# Patient Record
Sex: Male | Born: 1965 | Race: Black or African American | Hispanic: No | Smoking: Never smoker
Health system: Southern US, Community
[De-identification: ages and names within clinical notes are randomized; demographics above are authoritative.]

## PROBLEM LIST (undated history)

## (undated) DIAGNOSIS — B20 Human immunodeficiency virus [HIV] disease: Secondary | ICD-10-CM

## (undated) DIAGNOSIS — A609 Anogenital herpesviral infection, unspecified: Secondary | ICD-10-CM

## (undated) DIAGNOSIS — Z21 Asymptomatic human immunodeficiency virus [HIV] infection status: Secondary | ICD-10-CM

## (undated) DIAGNOSIS — I10 Essential (primary) hypertension: Secondary | ICD-10-CM

## (undated) HISTORY — PX: CIRCUMCISION: SUR203

## (undated) HISTORY — DX: Anogenital herpesviral infection, unspecified: A60.9

## (undated) HISTORY — PX: HERNIA REPAIR: SHX51

---

## 2010-09-16 ENCOUNTER — Emergency Department (HOSPITAL_COMMUNITY): Admission: EM | Admit: 2010-09-16 | Discharge: 2010-09-16 | Payer: Self-pay | Admitting: Emergency Medicine

## 2011-12-30 ENCOUNTER — Other Ambulatory Visit: Payer: Self-pay | Admitting: Internal Medicine

## 2011-12-30 ENCOUNTER — Ambulatory Visit (INDEPENDENT_AMBULATORY_CARE_PROVIDER_SITE_OTHER): Payer: Self-pay

## 2011-12-30 DIAGNOSIS — I10 Essential (primary) hypertension: Secondary | ICD-10-CM

## 2011-12-30 DIAGNOSIS — Z23 Encounter for immunization: Secondary | ICD-10-CM

## 2011-12-30 DIAGNOSIS — B20 Human immunodeficiency virus [HIV] disease: Secondary | ICD-10-CM

## 2011-12-30 LAB — CBC WITH DIFFERENTIAL/PLATELET
Eosinophils Absolute: 0.4 10*3/uL (ref 0.0–0.7)
Hemoglobin: 14.9 g/dL (ref 13.0–17.0)
Lymphs Abs: 0.7 10*3/uL (ref 0.7–4.0)
MCH: 32 pg (ref 26.0–34.0)
Monocytes Relative: 15 % — ABNORMAL HIGH (ref 3–12)
Neutro Abs: 1.2 10*3/uL — ABNORMAL LOW (ref 1.7–7.7)
Neutrophils Relative %: 45 % (ref 43–77)
Platelets: 170 10*3/uL (ref 150–400)
RBC: 4.66 MIL/uL (ref 4.22–5.81)
WBC: 2.7 10*3/uL — ABNORMAL LOW (ref 4.0–10.5)

## 2011-12-30 LAB — COMPLETE METABOLIC PANEL WITH GFR
ALT: 19 U/L (ref 0–53)
Albumin: 4.2 g/dL (ref 3.5–5.2)
Alkaline Phosphatase: 40 U/L (ref 39–117)
CO2: 26 mEq/L (ref 19–32)
GFR, Est African American: >89 mL/min
Glucose, Bld: 84 mg/dL (ref 70–99)
Potassium: 3.4 mEq/L — ABNORMAL LOW (ref 3.5–5.3)
Sodium: 139 mEq/L (ref 135–145)
Total Bilirubin: 0.7 mg/dL (ref 0.3–1.2)
Total Protein: 8 g/dL (ref 6.0–8.3)

## 2011-12-30 LAB — URINALYSIS
Hgb urine dipstick: NEGATIVE
Leukocytes, UA: NEGATIVE
Nitrite: NEGATIVE
Protein, ur: NEGATIVE mg/dL

## 2011-12-30 LAB — HEPATITIS B SURFACE ANTIBODY,QUALITATIVE: Hep B S Ab: POSITIVE — AB

## 2011-12-30 LAB — LIPID PANEL: Cholesterol: 139 mg/dL (ref 0–200)

## 2011-12-31 DIAGNOSIS — I10 Essential (primary) hypertension: Secondary | ICD-10-CM | POA: Insufficient documentation

## 2011-12-31 LAB — GC/CHLAMYDIA PROBE AMP, URINE: Chlamydia, Swab/Urine, PCR: NEGATIVE

## 2011-12-31 LAB — HEPATITIS A ANTIBODY, TOTAL: Hep A Total Ab: NEGATIVE

## 2011-12-31 LAB — HEPATITIS B CORE ANTIBODY, TOTAL: Hep B Core Total Ab: NEGATIVE

## 2012-01-01 LAB — TB SKIN TEST
Induration: 0
TB Skin Test: NEGATIVE mm

## 2012-01-01 LAB — HIV-1 RNA ULTRAQUANT REFLEX TO GENTYP+
HIV 1 RNA Quant: 48403 copies/mL — ABNORMAL HIGH (ref <20)
HIV-1 RNA Quant, Log: 4.68 {Log} — ABNORMAL HIGH (ref <1.30)

## 2012-01-13 ENCOUNTER — Ambulatory Visit (INDEPENDENT_AMBULATORY_CARE_PROVIDER_SITE_OTHER): Payer: Self-pay | Admitting: Internal Medicine

## 2012-01-13 ENCOUNTER — Ambulatory Visit: Payer: Self-pay

## 2012-01-13 ENCOUNTER — Encounter: Payer: Self-pay | Admitting: Internal Medicine

## 2012-01-13 VITALS — BP 166/109 | HR 75 | Temp 98.2°F | Wt 165.0 lb

## 2012-01-13 DIAGNOSIS — B20 Human immunodeficiency virus [HIV] disease: Secondary | ICD-10-CM | POA: Insufficient documentation

## 2012-01-13 DIAGNOSIS — Z298 Encounter for other specified prophylactic measures: Secondary | ICD-10-CM

## 2012-01-13 DIAGNOSIS — Z21 Asymptomatic human immunodeficiency virus [HIV] infection status: Secondary | ICD-10-CM

## 2012-01-13 DIAGNOSIS — Z79899 Other long term (current) drug therapy: Secondary | ICD-10-CM

## 2012-01-13 DIAGNOSIS — B370 Candidal stomatitis: Secondary | ICD-10-CM

## 2012-01-13 MED ORDER — FLUCONAZOLE 200 MG PO TABS: 200.0000 mg | ORAL_TABLET | Freq: Every day | ORAL | Status: AC

## 2012-01-13 MED ORDER — AZITHROMYCIN 600 MG PO TABS: 1200.0000 mg | ORAL_TABLET | ORAL | Status: DC

## 2012-01-13 MED ORDER — SULFAMETHOXAZOLE-TRIMETHOPRIM 800-160 MG PO TABS: 1.0000 | ORAL_TABLET | Freq: Every day | ORAL | Status: AC

## 2012-01-13 MED ORDER — EMTRICITABINE-TENOFOVIR DF 200-300 MG PO TABS: 1.0000 | ORAL_TABLET | Freq: Every day | ORAL | Status: DC

## 2012-01-13 MED ORDER — RALTEGRAVIR POTASSIUM 400 MG PO TABS: 400.0000 mg | ORAL_TABLET | Freq: Two times a day (BID) | ORAL | Status: DC

## 2012-01-13 NOTE — Progress Notes (Signed)
HIV INITIAL VISIT  RFV: to establish care/ start treatment  Subjective:    [name], male    DOB: [birth year], 46 y.o.   MRN: [id number]  HPI Colin Alexander is a pleasant 46 yo male involved, recently tested positive for HIV in Dec 2012, CD4 count 20(3%)/VL 48,403. ART naive. Risk Factors for HIV inc. Unprotected sex MSM. He is currently not in a relationship. He previously tested negative for HIV roughly 5 years ago. He denies any symptoms suggestive of OI. He denies fever/chills/nightsweats. He occasionally feels rawness sensation in his mouth. Occasional nasal congestion. No recent uri. He is still processing his diagnosis. He has disclosed his status to friends and some family.  Pmhx: htn Hx of STI  All: penicillin--> shortness of breath, rash  Meds: Lisinopril-hctz  Fh = cad, htn Sh = previously married, has a 35yr son. He is a Optician, dispensing.   Review of Systems Constitutional: Negative for fever, chills, diaphoresis, activity change, appetite change, fatigue and unexpected weight change.  HENT: positive for congestion, otherwise negative forsore throat, rhinorrhea, sneezing, trouble swallowing and sinus pressure.  Eyes: Negative for photophobia and visual disturbance.  Respiratory: Negative for cough, chest tightness, shortness of breath, wheezing and stridor.  Cardiovascular: Negative for chest pain, palpitations and leg swelling.  Gastrointestinal: Negative for nausea, vomiting, abdominal pain, diarrhea, constipation, blood in stool, abdominal distention and anal bleeding. Occasional mucosy bowel movements Genitourinary: Negative for dysuria, hematuria, flank pain and difficulty urinating.  Musculoskeletal: Negative for myalgias, back pain, joint swelling, arthralgias and gait problem.  Skin: Negative for color change, pallor, rash and wound.  Neurological: Negative for dizziness, tremors, weakness and light-headedness.  Hematological: Negative for adenopathy. Does not  bruise/bleed easily.  Psychiatric/Behavioral: Negative for behavioral problems, confusion, sleep disturbance, dysphoric mood, decreased concentration and agitation.       Objective:   Physical Exam BP 166/109  Pulse 75  Temp(Src) 98.2 F (36.8 C) (Oral)  Wt 165 lb (74.844 kg) Constitutional: He is oriented to person, place, and time. He appears well-developed and well-nourished. No distress. Older than stated age HENT: [place]/AT; pale conjunctiva. PERRLA, EOMI. No scleral icterus, normal fundoscopic exam; TM are clear bilaterally Mouth/Throat: Oropharynx is clear and moist. No oropharyngeal exudate. Slight increased erythema to tongue and posterior pharynx Cardiovascular: Normal rate, regular rhythm and normal heart sounds. Exam reveals no gallop and no friction rub.  No murmur heard.  Pulmonary/Chest: Effort normal and breath sounds normal. No respiratory distress. He has no wheezes.  Abdominal: Soft. Bowel sounds are normal. He exhibits no distension. There is no tenderness.  Lymphadenopathy: He has subcm bilateral cervical adenopathy.  Neurological: He is alert and oriented to person, place, and time.  Skin: Skin is warm and dry. No rash noted. No erythema.  Psychiatric: He has a normal mood and affect. His behavior is normal.      Assessment & Plan:  HIV = explained progression of HIV and relationship of CD 4 count and viral load, AIDS. He is eager to start on ART therapy. Will start on RAL and truvada  HTN= continue on lisinopril/hctz daily  rtc in 1-2 months

## 2012-01-14 ENCOUNTER — Telehealth: Payer: Self-pay | Admitting: *Deleted

## 2012-01-14 ENCOUNTER — Encounter: Payer: Self-pay | Admitting: *Deleted

## 2012-01-14 NOTE — Telephone Encounter (Signed)
Patient contacted her this morning and advised he needs a note for work for his lab appt and his office visit yesterday 01/13/12, he also states that the medication he started has his stomach upset and he could not work today. Advised the provider and she said ok to give note for absence to today and have him call back if he is still having stomach upset in morning and can not go to work as we may need to change or adjust but he does need medication.

## 2012-01-27 ENCOUNTER — Other Ambulatory Visit: Payer: Self-pay | Admitting: Licensed Clinical Social Worker

## 2012-01-27 DIAGNOSIS — B20 Human immunodeficiency virus [HIV] disease: Secondary | ICD-10-CM

## 2012-01-30 ENCOUNTER — Other Ambulatory Visit: Payer: Self-pay | Admitting: *Deleted

## 2012-01-30 DIAGNOSIS — B20 Human immunodeficiency virus [HIV] disease: Secondary | ICD-10-CM

## 2012-01-30 MED ORDER — EMTRICITABINE-TENOFOVIR DF 200-300 MG PO TABS: 1.0000 | ORAL_TABLET | Freq: Every day | ORAL | Status: DC

## 2012-01-30 MED ORDER — AZITHROMYCIN 600 MG PO TABS: 1200.0000 mg | ORAL_TABLET | ORAL | Status: AC

## 2012-01-30 MED ORDER — RALTEGRAVIR POTASSIUM 400 MG PO TABS: 400.0000 mg | ORAL_TABLET | Freq: Two times a day (BID) | ORAL | Status: DC

## 2012-02-24 ENCOUNTER — Other Ambulatory Visit: Payer: Self-pay

## 2012-03-16 ENCOUNTER — Ambulatory Visit: Payer: Self-pay | Admitting: Internal Medicine

## 2012-03-25 ENCOUNTER — Ambulatory Visit (INDEPENDENT_AMBULATORY_CARE_PROVIDER_SITE_OTHER): Payer: Self-pay | Admitting: Internal Medicine

## 2012-03-25 ENCOUNTER — Other Ambulatory Visit: Payer: Self-pay | Admitting: Internal Medicine

## 2012-03-25 ENCOUNTER — Encounter: Payer: Self-pay | Admitting: Internal Medicine

## 2012-03-25 VITALS — BP 159/116 | HR 79 | Temp 98.7°F | Wt 175.0 lb

## 2012-03-25 DIAGNOSIS — Z113 Encounter for screening for infections with a predominantly sexual mode of transmission: Secondary | ICD-10-CM

## 2012-03-25 DIAGNOSIS — Z21 Asymptomatic human immunodeficiency virus [HIV] infection status: Secondary | ICD-10-CM

## 2012-03-25 DIAGNOSIS — A6000 Herpesviral infection of urogenital system, unspecified: Secondary | ICD-10-CM

## 2012-03-25 DIAGNOSIS — B20 Human immunodeficiency virus [HIV] disease: Secondary | ICD-10-CM

## 2012-03-25 LAB — CBC WITH DIFFERENTIAL/PLATELET
Eosinophils Absolute: 0.7 10*3/uL (ref 0.0–0.7)
Hemoglobin: 15.5 g/dL (ref 13.0–17.0)
Lymphocytes Relative: 30 % (ref 12–46)
Lymphs Abs: 1.3 10*3/uL (ref 0.7–4.0)
MCH: 32.6 pg (ref 26.0–34.0)
MCV: 93.1 fL (ref 78.0–100.0)
Monocytes Relative: 7 % (ref 3–12)
Neutrophils Relative %: 46 % (ref 43–77)
RBC: 4.75 MIL/uL (ref 4.22–5.81)
WBC: 4.4 10*3/uL (ref 4.0–10.5)

## 2012-03-25 LAB — BASIC METABOLIC PANEL
CO2: 30 mEq/L (ref 19–32)
Calcium: 9.6 mg/dL (ref 8.4–10.5)
Chloride: 103 mEq/L (ref 96–112)
Glucose, Bld: 83 mg/dL (ref 70–99)
Sodium: 141 mEq/L (ref 135–145)

## 2012-03-25 MED ORDER — VALACYCLOVIR HCL 1 G PO TABS: 1000.0000 mg | ORAL_TABLET | Freq: Two times a day (BID) | ORAL | Status: DC

## 2012-03-26 LAB — T-HELPER CELL (CD4) - (RCID CLINIC ONLY)
CD4 % Helper T Cell: 9 % — ABNORMAL LOW (ref 33–55)
CD4 T Cell Abs: 110 uL — ABNORMAL LOW (ref 400–2700)

## 2012-03-29 ENCOUNTER — Telehealth: Payer: Self-pay | Admitting: *Deleted

## 2012-03-29 NOTE — Telephone Encounter (Signed)
Unable to process herpes sample due to sample being placed in the wrong type of tube.  Will forward to Dr. Drue Second.

## 2012-04-15 ENCOUNTER — Other Ambulatory Visit: Payer: Self-pay | Admitting: *Deleted

## 2012-04-15 DIAGNOSIS — I10 Essential (primary) hypertension: Secondary | ICD-10-CM

## 2012-04-15 MED ORDER — LISINOPRIL-HYDROCHLOROTHIAZIDE 20-25 MG PO TABS: 1.0000 | ORAL_TABLET | Freq: Every day | ORAL | Status: DC

## 2012-06-07 ENCOUNTER — Ambulatory Visit (INDEPENDENT_AMBULATORY_CARE_PROVIDER_SITE_OTHER): Payer: Self-pay | Admitting: Internal Medicine

## 2012-06-07 ENCOUNTER — Encounter: Payer: Self-pay | Admitting: Internal Medicine

## 2012-06-07 VITALS — BP 150/99 | HR 76 | Temp 97.5°F | Wt 177.0 lb

## 2012-06-07 DIAGNOSIS — B009 Herpesviral infection, unspecified: Secondary | ICD-10-CM

## 2012-06-07 DIAGNOSIS — A609 Anogenital herpesviral infection, unspecified: Secondary | ICD-10-CM | POA: Insufficient documentation

## 2012-06-07 DIAGNOSIS — Z21 Asymptomatic human immunodeficiency virus [HIV] infection status: Secondary | ICD-10-CM

## 2012-06-07 DIAGNOSIS — B20 Human immunodeficiency virus [HIV] disease: Secondary | ICD-10-CM

## 2012-06-07 MED ORDER — VALACYCLOVIR HCL 1 G PO TABS: 1000.0000 mg | ORAL_TABLET | Freq: Three times a day (TID) | ORAL | Status: DC

## 2012-06-07 MED ORDER — SULFAMETHOXAZOLE-TMP DS 800-160 MG PO TABS: 1.0000 | ORAL_TABLET | Freq: Every day | ORAL | Status: AC

## 2012-06-07 MED ORDER — CLOTRIMAZOLE 1 % EX CREA: TOPICAL_CREAM | Freq: Two times a day (BID) | CUTANEOUS | Status: DC

## 2012-06-07 NOTE — Progress Notes (Signed)
HIV CLINIC NOTE  RFV: follow up visit Subjective:    [name], male    DOB: 08-Sep-1966, 46 y.o.   MRN: [id number]  HPI Colin Alexander 46 yo M with HIV, CD 4 count nadir of 20 in Feb with VL 48,403. Started on Ral/Truvada in March, most recent labs in May showed CD 4 count of 110 and VL 71. In the past 1-2 wks he has noticed having increased irritation by his anus, feels like sharp needle throbbing sensation. He has notices trace amount of blood on his toilet paper on he wipes.  Current Outpatient Prescriptions on File Prior to Visit  Medication Sig Dispense Refill  . emtricitabine-tenofovir (TRUVADA) 200-300 MG per tablet Take 1 tablet by mouth daily.  30 tablet  5  . lisinopril-hydrochlorothiazide (PRINZIDE,ZESTORETIC) 20-25 MG per tablet Take 1 tablet by mouth daily.  30 tablet  prn  . raltegravir (ISENTRESS) 400 MG tablet Take 1 tablet (400 mg total) by mouth 2 (two) times daily.  60 tablet  5  . valACYclovir (VALTREX) 1000 MG tablet Take 1 tablet (1,000 mg total) by mouth 2 (two) times daily.  20 tablet  0   Active Ambulatory Problems    Diagnosis Date Noted  . HTN (hypertension) 12/31/2011  . HIV (human immunodeficiency virus infection) 01/13/2012   Resolved Ambulatory Problems    Diagnosis Date Noted  . No Resolved Ambulatory Problems   No Additional Past Medical History   History  Substance Use Topics  . Smoking status: Never Smoker   . Smokeless tobacco: Never Used  . Alcohol Use: No  family history is not on file.   Review of Systems  Constitutional: Negative for fever, chills, diaphoresis, activity change, appetite change, fatigue and unexpected weight change.  HENT: Negative for congestion, sore throat, rhinorrhea, sneezing, trouble swallowing and sinus pressure.  Eyes: Negative for photophobia and visual disturbance.  Respiratory: Negative for cough, chest tightness, shortness of breath, wheezing and stridor.  Cardiovascular: Negative for chest pain,  palpitations and leg swelling.  Gastrointestinal: Negative for nausea, vomiting, abdominal pain, diarrhea, constipation, blood in stool, abdominal distention and anal bleeding.  Genitourinary: Negative for dysuria, hematuria, flank pain and difficulty urinating.  Musculoskeletal: Negative for myalgias, back pain, joint swelling, arthralgias and gait problem.  Skin: per hpi  Neurological: Negative for dizziness, tremors, weakness and light-headedness.  Hematological: Negative for adenopathy. Does not bruise/bleed easily.  Psychiatric/Behavioral: Negative for behavioral problems, confusion, sleep disturbance, dysphoric mood, decreased concentration and agitation.       Objective:   Physical Exam BP 150/99  Pulse 76  Temp 97.5 F (36.4 C) (Oral)  Wt 177 lb (80.287 kg) Physical Exam  Constitutional: He is oriented to person, place, and time. He appears well-developed and well-nourished. No distress.  HENT: [place]/AT, PERRLA, EOMI Mouth/Throat: Oropharynx is clear and moist. No oropharyngeal exudate.  Cardiovascular: Normal rate, regular rhythm and normal heart sounds. Exam reveals no gallop and no friction rub.  No murmur heard.  Pulmonary/Chest: Effort normal and breath sounds normal. No respiratory distress. He has no wheezes.  Abdominal: Soft. Bowel sounds are normal. He exhibits no distension. There is no tenderness.  Lymphadenopathy:  no cervical adenopathy.  GU/buttocks: erythamatous , candidal appearing. Small punctate lesions consistent with HSV Neurological: He is alert and oriented to person, place, and time.  Skin: Skin is warm and dry. No rash noted. No erythema.  Psychiatric: He has a normal mood and affect. His behavior is normal.  Assessment & Plan:  HIV = continue with raltegravir and truvada. Will check labs at this visit  OI proph = continue with bactrim DS daily until CD > 200 for at least 3 months; continue with azithromycin q Tuesday for now, until we repeat  labs. Likely to d/c azithro in the near future.  Genital rash, predominantly near anus = appears to be consistent with HIV plus candidal superinfection. Will give valtrex 1gm TID x 10, then also use clotrimazole cream BID  Return to clinic in 2 wk for follow up and labs

## 2012-06-21 ENCOUNTER — Ambulatory Visit: Payer: Self-pay

## 2012-06-21 ENCOUNTER — Encounter: Payer: Self-pay | Admitting: Internal Medicine

## 2012-06-21 ENCOUNTER — Ambulatory Visit (INDEPENDENT_AMBULATORY_CARE_PROVIDER_SITE_OTHER): Payer: Self-pay | Admitting: Internal Medicine

## 2012-06-21 ENCOUNTER — Other Ambulatory Visit: Payer: Self-pay

## 2012-06-21 VITALS — BP 116/77 | HR 71 | Temp 98.6°F | Wt 178.0 lb

## 2012-06-21 DIAGNOSIS — Z21 Asymptomatic human immunodeficiency virus [HIV] infection status: Secondary | ICD-10-CM

## 2012-06-21 DIAGNOSIS — B20 Human immunodeficiency virus [HIV] disease: Secondary | ICD-10-CM

## 2012-06-21 LAB — CBC WITH DIFFERENTIAL/PLATELET
Basophils Absolute: 0.1 10*3/uL (ref 0.0–0.1)
Basophils Relative: 2 % — ABNORMAL HIGH (ref 0–1)
Eosinophils Absolute: 0.3 10*3/uL (ref 0.0–0.7)
HCT: 44 % (ref 39.0–52.0)
Hemoglobin: 14.9 g/dL (ref 13.0–17.0)
Lymphocytes Relative: 27 % (ref 12–46)
Lymphs Abs: 1.3 10*3/uL (ref 0.7–4.0)
MCV: 98 fL (ref 78.0–100.0)
Monocytes Absolute: 0.4 10*3/uL (ref 0.1–1.0)
Monocytes Relative: 8 % (ref 3–12)
RBC: 4.49 MIL/uL (ref 4.22–5.81)
RDW: 12.9 % (ref 11.5–15.5)
WBC: 4.9 10*3/uL (ref 4.0–10.5)

## 2012-06-21 NOTE — Progress Notes (Signed)
Subjective:    [name], male    DOB: 10-31-1966, 46 y.o.   MRN: [id number]  HPI Mr Matulich 46 yo M with HIV,  Started on Ral/Truvada in March, most recent labs in May showed CD 4 count of 110 and VL 71. He was seen 2 wks ago with  increased irritation by his anus, feels like sharp needle throbbing sensation. He has notices trace amount of blood on his toilet paper on he wipes. Saw 2 wks ago for what appeared to be HSV GU infection, with candidal superfection. He was given tx doses of valacyclovir with lotrimin cream which has treated the rash around his anus/perirectal area. His rash is resolved but still feels alittle raw   He denies missing any doses. He is Tired today due to staying up late. 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 clotrimazole (LOTRIMIN) 1 % cream Apply topically 2 (two) times daily.  30 g  0  . emtricitabine-tenofovir (TRUVADA) 200-300 MG per tablet Take 1 tablet by mouth daily.  30 tablet  5  . lisinopril-hydrochlorothiazide (PRINZIDE,ZESTORETIC) 20-25 MG per tablet Take 1 tablet by mouth daily.  30 tablet  prn  . raltegravir (ISENTRESS) 400 MG tablet Take 1 tablet (400 mg total) by mouth 2 (two) times daily.  60 tablet  5  . valACYclovir (VALTREX) 1000 MG tablet Take 1 tablet (1,000 mg total) by mouth 3 (three) times daily.  60 tablet  1   Active Ambulatory Problems    Diagnosis Date Noted  . HTN (hypertension) 12/31/2011  . HIV (human immunodeficiency virus infection) 01/13/2012  . 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 No Resolved Ambulatory Problems   No Additional Past Medical History     Review of Systems Review of Systems  Constitutional: Negative for fever, chills, diaphoresis, activity  change, appetite change, fatigue and unexpected weight change.  HENT: Negative for congestion, sore throat, rhinorrhea, sneezing, trouble swallowing and sinus pressure.  Eyes: Negative for photophobia and visual disturbance.  Respiratory: Negative for cough, chest tightness, shortness of breath, wheezing and stridor.  Cardiovascular: Negative for chest pain, palpitations and leg swelling.  Gastrointestinal: Negative for nausea, vomiting, abdominal pain, diarrhea, constipation, blood in stool, abdominal distention and anal bleeding.  Genitourinary: Negative for dysuria, hematuria, flank pain and difficulty urinating.  Musculoskeletal: Negative for myalgias, back pain, joint swelling, arthralgias and gait problem.  Skin: Negative for color change, pallor, rash and wound.  Neurological: Negative for dizziness, tremors, weakness and light-headedness.  Hematological: Negative for adenopathy. Does not bruise/bleed easily.  Psychiatric/Behavioral: Negative for behavioral problems, confusion, sleep disturbance, dysphoric mood, decreased concentration and agitation.       Objective:   Physical Exam BP 116/77  Pulse 71  Temp 98.6 F (37 C) (Oral)  Wt 178 lb (80.74 kg) Physical Exam  Constitutional: He is oriented to person, place, and time. He appears well-developed and well-nourished. No distress.  HENT:  Mouth/Throat: Oropharynx is clear and moist. No oropharyngeal exudate.  Cardiovascular: Normal rate, regular rhythm and normal heart sounds. Exam reveals no gallop and no friction rub.  No murmur heard.  Pulmonary/Chest: Effort normal and breath sounds normal. No respiratory distress. He has no wheezes.  Abdominal: Soft. Bowel sounds are normal. He  exhibits no distension. There is no tenderness.  Lymphadenopathy: no cervical adenopathy.  GU = erythema gluteal fold c/w candidal infection Neurological: He is alert and oriented to person, place, and time.  Skin: Skin is warm and dry. No rash  noted. No erythema.  Psychiatric: He has a normal mood and affect. His behavior is normal.         Assessment & Plan:   hiv = recheck labs today hsv proph = valtrex 500mg  bid oi proph = azithromycin 1200mg  every tuesday. PCP proph continue with bactrim Candidal skin infection = continue with clotrimazole cream BID  Health maintenance = will call before next appt to get flu shot  rtc in 45month

## 2012-06-21 NOTE — Addendum Note (Signed)
Addended by: Lurlean Leyden on: 06/21/2012 03:59 PM   Modules accepted: Orders

## 2012-06-22 LAB — COMPLETE METABOLIC PANEL WITH GFR
AST: 19 U/L (ref 0–37)
Alkaline Phosphatase: 38 U/L — ABNORMAL LOW (ref 39–117)
BUN: 12 mg/dL (ref 6–23)
Calcium: 9.7 mg/dL (ref 8.4–10.5)
Creat: 1.06 mg/dL (ref 0.50–1.35)
Total Bilirubin: 0.7 mg/dL (ref 0.3–1.2)

## 2012-06-22 LAB — HIV-1 RNA QUANT-NO REFLEX-BLD
HIV 1 RNA Quant: 29 copies/mL — ABNORMAL HIGH (ref <20)
HIV-1 RNA Quant, Log: 1.46 {Log} — ABNORMAL HIGH (ref <1.30)

## 2012-06-23 LAB — T-HELPER CELL (CD4) - (RCID CLINIC ONLY)
CD4 % Helper T Cell: 10 % — ABNORMAL LOW (ref 33–55)
CD4 T Cell Abs: 120 uL — ABNORMAL LOW (ref 400–2700)

## 2012-06-28 ENCOUNTER — Other Ambulatory Visit: Payer: Self-pay | Admitting: Licensed Clinical Social Worker

## 2012-06-28 DIAGNOSIS — B20 Human immunodeficiency virus [HIV] disease: Secondary | ICD-10-CM

## 2012-06-28 MED ORDER — EMTRICITABINE-TENOFOVIR DF 200-300 MG PO TABS: 1.0000 | ORAL_TABLET | Freq: Every day | ORAL | Status: DC

## 2012-06-28 MED ORDER — RALTEGRAVIR POTASSIUM 400 MG PO TABS: 400.0000 mg | ORAL_TABLET | Freq: Two times a day (BID) | ORAL | Status: DC

## 2012-06-29 ENCOUNTER — Other Ambulatory Visit: Payer: Self-pay

## 2012-07-05 ENCOUNTER — Ambulatory Visit: Payer: Self-pay | Admitting: Internal Medicine

## 2012-07-13 ENCOUNTER — Encounter: Payer: Self-pay | Admitting: Internal Medicine

## 2012-07-13 ENCOUNTER — Ambulatory Visit (INDEPENDENT_AMBULATORY_CARE_PROVIDER_SITE_OTHER): Payer: Self-pay | Admitting: Internal Medicine

## 2012-07-13 VITALS — BP 137/90 | HR 88 | Temp 98.2°F | Wt 177.0 lb

## 2012-07-13 DIAGNOSIS — B20 Human immunodeficiency virus [HIV] disease: Secondary | ICD-10-CM

## 2012-07-13 DIAGNOSIS — Z21 Asymptomatic human immunodeficiency virus [HIV] infection status: Secondary | ICD-10-CM

## 2012-07-13 MED ORDER — SULFAMETHOXAZOLE-TMP DS 800-160 MG PO TABS: 1.0000 | ORAL_TABLET | Freq: Every day | ORAL | Status: AC

## 2012-07-13 NOTE — Progress Notes (Signed)
  Subjective:    [name], male    DOB: [birth year], 46 y.o.   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 Came into clinic as routine visit, (but this was a front office confusion). He states that he is doing well.  ROS:  No fever.chills.ns.nausea.vomiting. He has occasional GERD but it has improved. No recent HA.  Current Outpatient Prescriptions on File Prior to Visit  Medication Sig Dispense Refill  . clotrimazole (LOTRIMIN) 1 % cream Apply topically 2 (two) times daily.  30 g  0  . emtricitabine-tenofovir (TRUVADA) 200-300 MG per tablet Take 1 tablet by mouth daily.  30 tablet  5  . lisinopril-hydrochlorothiazide (PRINZIDE,ZESTORETIC) 20-25 MG per tablet Take 1 tablet by mouth daily.  30 tablet  prn  . raltegravir (ISENTRESS) 400 MG tablet Take 1 tablet (400 mg total) by mouth 2 (two) times daily.  60 tablet  5  . valACYclovir (VALTREX) 1000 MG tablet Take 1 tablet (1,000 mg total) by mouth 3 (three) times daily.  60 tablet  1   Active Ambulatory Problems    Diagnosis Date Noted  . HTN (hypertension) 12/31/2011  . HIV (human immunodeficiency virus infection) 01/13/2012  . 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 He appears well-developed and well-nourished. No distress.  HENT:  Mouth/Throat: Oropharynx is clear and moist. No oropharyngeal exudate.  Cardiovascular: Normal rate, regular rhythm and normal heart sounds. Exam  reveals no gallop and no friction rub.  No murmur heard.  Pulmonary/Chest: Effort normal and breath sounds normal. No respiratory distress. He has no wheezes.  Abdominal: Soft. Bowel sounds are normal. He exhibits no distension. There is no tenderness.  Lymphadenopathy:  He has no cervical adenopathy.  Neurological: He is alert and oriented to person, place, and time.  Skin: Skin is warm and dry. No rash noted. No erythema.  Psychiatric: He has a normal mood and affect. His behavior is normal.          Assessment & Plan:  hiv = truvada/raltegravir. We reviewed his results; discussed the importance of viral suppression. Talked about T-cells, what does his CD 4 count mean.  pcp proph= bactrim ds  1 tab daily. rx sent to pharmacy  MAC proph = continue azithromycin since his CD 4 count just recently > 100. Will discontinue at next appt.  Health maintenance = will have clinic call back for flu vaccine  rtc in 3 months

## 2012-08-18 NOTE — Progress Notes (Signed)
HIV CLINIC NOTE   RFV: routine visit Subjective:    [name], male    DOB: 30-Jun-1966, 46 y.o.   MRN: [id number]  HPI46yo Male with HIV, CD 4 count of 20(3%)/VL 48,840, recently started on raltegravir and truvada. As well as azithromycin and bactrim for OI prophylaxis. Valtrex for HSV proctitis. He still reports having rectal itching but much improved from before. He is taking his medications routinely without missing a dose. He states that he has not had recent unprotected sex.  Current Outpatient Prescriptions on File Prior to Visit  Medication Sig Dispense Refill  . lisinopril-hydrochlorothiazide (PRINZIDE,ZESTORETIC) 20-25 MG per tablet Take 1 tablet by mouth daily.  30 tablet  prn   Active Ambulatory Problems    Diagnosis Date Noted  . HTN (hypertension) 12/31/2011  . HIV (human immunodeficiency virus infection) 01/13/2012  . 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 No Resolved Ambulatory Problems   No Additional Past Medical History       Review of Systems No fever, chills, nightsweats, no diarrhea, vomiting, headache. He reports dryness to face but no other complaints    Objective:   Physical Exam BP 159/116  Pulse 79  Temp 98.7 F (37.1 C) (Oral)  Wt 175 lb (79.379 kg) Physical Exam  Constitutional: He is oriented to person, place, and time. He appears well-developed and well-nourished. No distress.  HENT:  Mouth/Throat: Oropharynx is clear and moist. No oropharyngeal exudate.  Cardiovascular: Normal rate, regular rhythm and normal heart sounds. Exam reveals no gallop and no friction rub.  No murmur heard.  Pulmonary/Chest: Effort normal and breath sounds normal. No respiratory distress. He has no wheezes.  Abdominal: Soft. Bowel sounds are normal. He exhibits no distension. There is no tenderness.  Lymphadenopathy:  He has no cervical adenopathy.  GU= anus and  perirectal area looks slightly inflammed but no ulcerative lesions seen. Skin: Skin is warm and dry. No rash noted. No erythema.  Psychiatric: He has a normal mood and affect. His behavior is normal.       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 Reviewed importance of adherence. Check labs at this visit.  Facial rash = appears c/w dry skin. Recommended numerous applications of lotion to see if that remedies the scenario.

## 2012-08-30 ENCOUNTER — Other Ambulatory Visit: Payer: Self-pay | Admitting: *Deleted

## 2012-08-30 DIAGNOSIS — B20 Human immunodeficiency virus [HIV] disease: Secondary | ICD-10-CM

## 2012-08-30 MED ORDER — AZITHROMYCIN 600 MG PO TABS: 600.0000 mg | ORAL_TABLET | ORAL | Status: DC

## 2012-09-30 ENCOUNTER — Other Ambulatory Visit (INDEPENDENT_AMBULATORY_CARE_PROVIDER_SITE_OTHER): Payer: Self-pay

## 2012-09-30 ENCOUNTER — Ambulatory Visit (INDEPENDENT_AMBULATORY_CARE_PROVIDER_SITE_OTHER): Payer: Self-pay

## 2012-09-30 DIAGNOSIS — B20 Human immunodeficiency virus [HIV] disease: Secondary | ICD-10-CM

## 2012-09-30 DIAGNOSIS — Z23 Encounter for immunization: Secondary | ICD-10-CM

## 2012-09-30 LAB — CBC WITH DIFFERENTIAL/PLATELET
HCT: 44.3 % (ref 39.0–52.0)
Hemoglobin: 15.8 g/dL (ref 13.0–17.0)
Lymphocytes Relative: 30 % (ref 12–46)
Monocytes Absolute: 0.3 10*3/uL (ref 0.1–1.0)
Monocytes Relative: 7 % (ref 3–12)
Neutro Abs: 1.9 10*3/uL (ref 1.7–7.7)
Neutrophils Relative %: 46 % (ref 43–77)
RBC: 4.66 MIL/uL (ref 4.22–5.81)
WBC: 4.1 10*3/uL (ref 4.0–10.5)

## 2012-09-30 LAB — COMPREHENSIVE METABOLIC PANEL
ALT: 20 U/L (ref 0–53)
AST: 20 U/L (ref 0–37)
CO2: 30 mEq/L (ref 19–32)
Calcium: 9.6 mg/dL (ref 8.4–10.5)
Chloride: 100 mEq/L (ref 96–112)
Creat: 1.14 mg/dL (ref 0.50–1.35)
Sodium: 138 mEq/L (ref 135–145)
Total Bilirubin: 0.7 mg/dL (ref 0.3–1.2)
Total Protein: 7.8 g/dL (ref 6.0–8.3)

## 2012-10-01 LAB — T-HELPER CELL (CD4) - (RCID CLINIC ONLY): CD4 T Cell Abs: 140 uL — ABNORMAL LOW (ref 400–2700)

## 2012-10-02 LAB — HIV-1 RNA QUANT-NO REFLEX-BLD
HIV 1 RNA Quant: 100 copies/mL — ABNORMAL HIGH (ref <20)
HIV-1 RNA Quant, Log: 2 {Log} — ABNORMAL HIGH (ref <1.30)

## 2012-10-04 ENCOUNTER — Other Ambulatory Visit: Payer: Self-pay | Admitting: *Deleted

## 2012-10-04 DIAGNOSIS — B20 Human immunodeficiency virus [HIV] disease: Secondary | ICD-10-CM

## 2012-10-04 MED ORDER — VALACYCLOVIR HCL 1 G PO TABS: 1000.0000 mg | ORAL_TABLET | Freq: Three times a day (TID) | ORAL | Status: DC

## 2012-10-14 ENCOUNTER — Ambulatory Visit: Payer: Self-pay | Admitting: Internal Medicine

## 2012-10-17 ENCOUNTER — Encounter (HOSPITAL_COMMUNITY): Payer: Self-pay | Admitting: *Deleted

## 2012-10-17 ENCOUNTER — Emergency Department (HOSPITAL_COMMUNITY): Payer: Self-pay

## 2012-10-17 ENCOUNTER — Observation Stay (HOSPITAL_COMMUNITY)
Admission: EM | Admit: 2012-10-17 | Discharge: 2012-10-19 | Disposition: A | Discharge status: Home / Self Care | Payer: Self-pay | Attending: Internal Medicine | Admitting: Internal Medicine

## 2012-10-17 DIAGNOSIS — I10 Essential (primary) hypertension: Secondary | ICD-10-CM | POA: Diagnosis present

## 2012-10-17 DIAGNOSIS — R0602 Shortness of breath: Secondary | ICD-10-CM | POA: Insufficient documentation

## 2012-10-17 DIAGNOSIS — Z8249 Family history of ischemic heart disease and other diseases of the circulatory system: Secondary | ICD-10-CM

## 2012-10-17 DIAGNOSIS — R079 Chest pain, unspecified: Principal | ICD-10-CM | POA: Diagnosis present

## 2012-10-17 DIAGNOSIS — A609 Anogenital herpesviral infection, unspecified: Secondary | ICD-10-CM

## 2012-10-17 DIAGNOSIS — J40 Bronchitis, not specified as acute or chronic: Secondary | ICD-10-CM | POA: Diagnosis present

## 2012-10-17 DIAGNOSIS — B20 Human immunodeficiency virus [HIV] disease: Secondary | ICD-10-CM | POA: Diagnosis present

## 2012-10-17 DIAGNOSIS — R0789 Other chest pain: Secondary | ICD-10-CM | POA: Diagnosis present

## 2012-10-17 DIAGNOSIS — B009 Herpesviral infection, unspecified: Secondary | ICD-10-CM

## 2012-10-17 DIAGNOSIS — Z21 Asymptomatic human immunodeficiency virus [HIV] infection status: Secondary | ICD-10-CM | POA: Diagnosis present

## 2012-10-17 HISTORY — DX: Human immunodeficiency virus (HIV) disease: B20

## 2012-10-17 HISTORY — DX: Essential (primary) hypertension: I10

## 2012-10-17 HISTORY — DX: Asymptomatic human immunodeficiency virus (hiv) infection status: Z21

## 2012-10-17 LAB — CBC WITH DIFFERENTIAL/PLATELET
Basophils Absolute: 0 10*3/uL (ref 0.0–0.1)
Basophils Relative: 1 % (ref 0–1)
Eosinophils Absolute: 0.5 10*3/uL (ref 0.0–0.7)
Eosinophils Relative: 11 % — ABNORMAL HIGH (ref 0–5)
HCT: 43.7 % (ref 39.0–52.0)
Hemoglobin: 15.6 g/dL (ref 13.0–17.0)
Lymphocytes Relative: 26 % (ref 12–46)
Lymphs Abs: 1.2 10*3/uL (ref 0.7–4.0)
MCH: 35 pg — ABNORMAL HIGH (ref 26.0–34.0)
MCHC: 35.7 g/dL (ref 30.0–36.0)
MCV: 98 fL (ref 78.0–100.0)
Monocytes Absolute: 0.3 10*3/uL (ref 0.1–1.0)
Monocytes Relative: 7 % (ref 3–12)
Neutro Abs: 2.6 10*3/uL (ref 1.7–7.7)
Neutrophils Relative %: 55 % (ref 43–77)
Platelets: 204 10*3/uL (ref 150–400)
RBC: 4.46 MIL/uL (ref 4.22–5.81)
RDW: 12.4 % (ref 11.5–15.5)
WBC: 4.6 10*3/uL (ref 4.0–10.5)

## 2012-10-17 LAB — COMPREHENSIVE METABOLIC PANEL
ALT: 13 U/L (ref 0–53)
AST: 18 U/L (ref 0–37)
Albumin: 3.8 g/dL (ref 3.5–5.2)
Alkaline Phosphatase: 40 U/L (ref 39–117)
BUN: 12 mg/dL (ref 6–23)
CO2: 28 mEq/L (ref 19–32)
Calcium: 9 mg/dL (ref 8.4–10.5)
Chloride: 99 mEq/L (ref 96–112)
Creatinine, Ser: 1.12 mg/dL (ref 0.50–1.35)
GFR calc Af Amer: 89 mL/min — ABNORMAL LOW (ref >90)
GFR calc non Af Amer: 77 mL/min — ABNORMAL LOW (ref >90)
Glucose, Bld: 101 mg/dL — ABNORMAL HIGH (ref 70–99)
Potassium: 3.8 mEq/L (ref 3.5–5.1)
Sodium: 134 mEq/L — ABNORMAL LOW (ref 135–145)
Total Bilirubin: 0.5 mg/dL (ref 0.3–1.2)
Total Protein: 7.2 g/dL (ref 6.0–8.3)

## 2012-10-17 LAB — TROPONIN I
Troponin I: <0.3 ng/mL (ref <0.30)
Troponin I: <0.3 ng/mL (ref <0.30)

## 2012-10-17 LAB — PRO B NATRIURETIC PEPTIDE: Pro B Natriuretic peptide (BNP): <5 pg/mL (ref 0–125)

## 2012-10-17 LAB — LIPASE, BLOOD: Lipase: 38 U/L (ref 11–59)

## 2012-10-17 LAB — LIPID PANEL
HDL: 40 mg/dL (ref >39)
LDL Cholesterol: 74 mg/dL (ref 0–99)

## 2012-10-17 MED ORDER — LISINOPRIL 10 MG PO TABS
20.0000 mg | ORAL_TABLET | Freq: Every day | ORAL | Status: DC
  Administered 2012-10-17 – 2012-10-18 (×2): 20 mg via ORAL
  Filled 2012-10-17 (×3): qty 2

## 2012-10-17 MED ORDER — ASPIRIN 81 MG PO CHEW
324.0000 mg | CHEWABLE_TABLET | Freq: Once | ORAL | Status: AC
  Administered 2012-10-17: 324 mg via ORAL
  Filled 2012-10-17: qty 4

## 2012-10-17 MED ORDER — EMTRICITABINE-TENOFOVIR DF 200-300 MG PO TABS
1.0000 | ORAL_TABLET | Freq: Every day | ORAL | Status: DC
  Administered 2012-10-17 – 2012-10-18 (×2): 1 via ORAL
  Filled 2012-10-17 (×4): qty 1

## 2012-10-17 MED ORDER — ONDANSETRON HCL 4 MG/2ML IJ SOLN: 4.0000 mg | Freq: Four times a day (QID) | INTRAMUSCULAR | Status: DC | PRN

## 2012-10-17 MED ORDER — HYDROCHLOROTHIAZIDE 25 MG PO TABS
25.0000 mg | ORAL_TABLET | Freq: Every day | ORAL | Status: DC
  Administered 2012-10-17 – 2012-10-18 (×2): 25 mg via ORAL
  Filled 2012-10-17 (×3): qty 1

## 2012-10-17 MED ORDER — ONDANSETRON HCL 4 MG/2ML IJ SOLN
4.0000 mg | Freq: Once | INTRAMUSCULAR | Status: AC
  Administered 2012-10-17: 4 mg via INTRAVENOUS
  Filled 2012-10-17: qty 2

## 2012-10-17 MED ORDER — ACETAMINOPHEN 325 MG PO TABS
650.0000 mg | ORAL_TABLET | Freq: Four times a day (QID) | ORAL | Status: DC | PRN
  Administered 2012-10-18: 650 mg via ORAL
  Filled 2012-10-17: qty 2

## 2012-10-17 MED ORDER — VALACYCLOVIR HCL 500 MG PO TABS
1000.0000 mg | ORAL_TABLET | Freq: Three times a day (TID) | ORAL | Status: DC
  Administered 2012-10-17 – 2012-10-19 (×5): 1000 mg via ORAL
  Filled 2012-10-17 (×9): qty 2

## 2012-10-17 MED ORDER — ENOXAPARIN SODIUM 40 MG/0.4ML ~~LOC~~ SOLN
40.0000 mg | SUBCUTANEOUS | Status: DC
  Administered 2012-10-17 – 2012-10-19 (×2): 40 mg via SUBCUTANEOUS
  Filled 2012-10-17 (×3): qty 0.4

## 2012-10-17 MED ORDER — NITROGLYCERIN 0.4 MG SL SUBL: 0.4000 mg | SUBLINGUAL_TABLET | SUBLINGUAL | Status: DC | PRN

## 2012-10-17 MED ORDER — VALACYCLOVIR HCL 500 MG PO TABS
ORAL_TABLET | ORAL | Status: AC
  Filled 2012-10-17: qty 2

## 2012-10-17 MED ORDER — ONDANSETRON HCL 4 MG PO TABS: 4.0000 mg | ORAL_TABLET | Freq: Four times a day (QID) | ORAL | Status: DC | PRN

## 2012-10-17 MED ORDER — MORPHINE SULFATE 2 MG/ML IJ SOLN
2.0000 mg | INTRAMUSCULAR | Status: DC | PRN
  Administered 2012-10-17 – 2012-10-18 (×4): 2 mg via INTRAVENOUS
  Filled 2012-10-17 (×4): qty 1

## 2012-10-17 MED ORDER — LISINOPRIL-HYDROCHLOROTHIAZIDE 20-25 MG PO TABS: 1.0000 | ORAL_TABLET | Freq: Every day | ORAL | Status: DC

## 2012-10-17 MED ORDER — NITROGLYCERIN 0.4 MG SL SUBL
0.4000 mg | SUBLINGUAL_TABLET | SUBLINGUAL | Status: AC | PRN
  Administered 2012-10-17 (×3): 0.4 mg via SUBLINGUAL

## 2012-10-17 MED ORDER — HYDROMORPHONE HCL PF 1 MG/ML IJ SOLN
1.0000 mg | Freq: Once | INTRAMUSCULAR | Status: AC
  Administered 2012-10-17: 1 mg via INTRAVENOUS
  Filled 2012-10-17: qty 1

## 2012-10-17 MED ORDER — IOHEXOL 350 MG/ML SOLN
100.0000 mL | Freq: Once | INTRAVENOUS | Status: AC | PRN
  Administered 2012-10-17: 100 mL via INTRAVENOUS

## 2012-10-17 MED ORDER — ACETAMINOPHEN 650 MG RE SUPP: 650.0000 mg | Freq: Four times a day (QID) | RECTAL | Status: DC | PRN

## 2012-10-17 MED ORDER — SODIUM CHLORIDE 0.9 % IV SOLN
INTRAVENOUS | Status: AC
  Administered 2012-10-17: 14:00:00 via INTRAVENOUS

## 2012-10-17 MED ORDER — NITROGLYCERIN 0.4 MG SL SUBL
SUBLINGUAL_TABLET | SUBLINGUAL | Status: AC
  Administered 2012-10-17: 0.4 mg via SUBLINGUAL
  Filled 2012-10-17: qty 25

## 2012-10-17 MED ORDER — POTASSIUM CHLORIDE IN NACL 20-0.9 MEQ/L-% IV SOLN
INTRAVENOUS | Status: DC
  Administered 2012-10-17 – 2012-10-18 (×2): via INTRAVENOUS

## 2012-10-17 MED ORDER — RALTEGRAVIR POTASSIUM 400 MG PO TABS
400.0000 mg | ORAL_TABLET | Freq: Two times a day (BID) | ORAL | Status: DC
  Administered 2012-10-17 – 2012-10-18 (×3): 400 mg via ORAL
  Filled 2012-10-17 (×6): qty 1

## 2012-10-17 MED ORDER — SODIUM CHLORIDE 0.9 % IJ SOLN
3.0000 mL | Freq: Two times a day (BID) | INTRAMUSCULAR | Status: DC
  Administered 2012-10-18: 3 mL via INTRAVENOUS

## 2012-10-17 MED ORDER — ASPIRIN EC 325 MG PO TBEC
325.0000 mg | DELAYED_RELEASE_TABLET | Freq: Every day | ORAL | Status: DC
  Administered 2012-10-17 – 2012-10-18 (×2): 325 mg via ORAL
  Filled 2012-10-17 (×3): qty 1

## 2012-10-17 NOTE — ED Notes (Signed)
C/o chest pain rates 8/10.  Medicated with NTG 0.4mg  SL x 1.  Pt is in no apparent distress, talking with family members at bedside, and also having telephone conversation.

## 2012-10-17 NOTE — ED Notes (Addendum)
Pt woke up to use restroom. Pt had a BM and felt dizzy, c/o chest pain, and sob. Pt also c/o throbbing in left cheek.

## 2012-10-17 NOTE — Progress Notes (Signed)
Results for orders placed during the hospital encounter of 10/17/12  CBC WITH DIFFERENTIAL      Component Value Range   WBC 4.6  4.0 - 10.5 K/uL   RBC 4.46  4.22 - 5.81 MIL/uL   Hemoglobin 15.6  13.0 - 17.0 g/dL   HCT 40.9  81.1 - 91.4 %   MCV 98.0  78.0 - 100.0 fL   MCH 35.0 (*) 26.0 - 34.0 pg   MCHC 35.7  30.0 - 36.0 g/dL   RDW 78.2  95.6 - 21.3 %   Platelets 204  150 - 400 K/uL   Neutrophils Relative 55  43 - 77 %   Neutro Abs 2.6  1.7 - 7.7 K/uL   Lymphocytes Relative 26  12 - 46 %   Lymphs Abs 1.2  0.7 - 4.0 K/uL   Monocytes Relative 7  3 - 12 %   Monocytes Absolute 0.3  0.1 - 1.0 K/uL   Eosinophils Relative 11 (*) 0 - 5 %   Eosinophils Absolute 0.5  0.0 - 0.7 K/uL   Basophils Relative 1  0 - 1 %   Basophils Absolute 0.0  0.0 - 0.1 K/uL  TROPONIN I      Component Value Range   Troponin I <0.30  <0.30 ng/mL  COMPREHENSIVE METABOLIC PANEL      Component Value Range   Sodium 134 (*) 135 - 145 mEq/L   Potassium 3.8  3.5 - 5.1 mEq/L   Chloride 99  96 - 112 mEq/L   CO2 28  19 - 32 mEq/L   Glucose, Bld 101 (*) 70 - 99 mg/dL   BUN 12  6 - 23 mg/dL   Creatinine, Ser 0.86  0.50 - 1.35 mg/dL   Calcium 9.0  8.4 - 57.8 mg/dL   Total Protein 7.2  6.0 - 8.3 g/dL   Albumin 3.8  3.5 - 5.2 g/dL   AST 18  0 - 37 U/L   ALT 13  0 - 53 U/L   Alkaline Phosphatase 40  39 - 117 U/L   Total Bilirubin 0.5  0.3 - 1.2 mg/dL   GFR calc non Af Amer 77 (*) >90 mL/min   GFR calc Af Amer 89 (*) >90 mL/min  PRO B NATRIURETIC PEPTIDE      Component Value Range   Pro B Natriuretic peptide (BNP) <5.0  0 - 125 pg/mL   Ct Angio Chest Pe W/cm &/or Wo Cm  10/17/2012  *RADIOLOGY REPORT*  Clinical Data: Shortness of breath.  CT ANGIOGRAPHY CHEST  Technique:  Multidetector CT imaging of the chest using the standard protocol during bolus administration of intravenous contrast. Multiplanar reconstructed images including MIPs were obtained and reviewed to evaluate the vascular anatomy.  Contrast:  OMNIPAQUE IOHEXOL 350 MG/ML SOLN  Comparison: Chest radiograph 10/17/2012  Findings: There is no evidence for pulmonary embolism.  There is no evidence for chest lymphadenopathy.  The heart size is normal.  No significant pericardial or pleural fluid.  Images of the upper abdomen are unremarkable.  The trachea and mainstem bronchi are patent.  The patient appears rotated in the region of the cervicothoracic junction.  There is mild dependent atelectasis in the lower lobes.  No evidence for airspace disease or lung consolidation.  No acute bony abnormalities.  IMPRESSION: Negative for pulmonary embolism.  No acute chest findings.   Original Report Authenticated By: Richarda Overlie, M.D.    Dg Chest Portable 1 View  10/17/2012  *RADIOLOGY REPORT*  Clinical Data: Mid back pain.  PORTABLE CHEST - 1 VIEW  Comparison: None.  Findings: Single view of the chest demonstrates clear lungs. Heart and mediastinum are within normal limits.  The trachea is midline. Bony thorax is intact.  IMPRESSION: No acute cardiopulmonary disease.   Original Report Authenticated By: Richarda Overlie, M.D.     Lab tests reassuring to this point.   He is resting comfortably, but says he has chest pain when questioned. He has had no prior episodes of chest pain like  this, and has had no prior cardiac workup.  Will request [facility] to admit him for chest pain observation.  10:10 AM Discussed with Dr. Kerry Hough, admit to a telemetry unit to Team 2.

## 2012-10-17 NOTE — ED Provider Notes (Signed)
History     CSN: [id number]  Arrival date & time 10/17/12  0559   First MD Initiated Contact with Patient 10/17/12 [phone]      Chief Complaint  Patient presents with  . Shortness of Breath  . Dizziness  . Chest Pain    (Consider location/radiation/quality/duration/timing/severity/associated sxs/prior treatment) Patient is a 46 y.o. male presenting with shortness of breath and chest pain. The history is provided by the patient (the pt states he started with sob this am and pain in his upper back). No language interpreter was used.  Shortness of Breath  The current episode started today. The problem occurs rarely. The problem has been gradually improving. The problem is moderate. Nothing relieves the symptoms. Nothing aggravates the symptoms. Associated symptoms include chest pain and shortness of breath. Pertinent negatives include no cough.  Chest Pain Primary symptoms include shortness of breath. Pertinent negatives for primary symptoms include no fatigue, no cough and no abdominal pain.  Pertinent negatives for past medical history include no seizures.     Past Medical History  Diagnosis Date  . Hypertension     Past Surgical History  Procedure Date  . Circumcision   . Hernia repair     History reviewed. No pertinent family history.  History  Substance Use Topics  . Smoking status: Never Smoker   . Smokeless tobacco: Never Used  . Alcohol Use: No      Review of Systems  Constitutional: Negative for fatigue.  HENT: Negative for congestion, sinus pressure and ear discharge.   Eyes: Negative for discharge.  Respiratory: Positive for shortness of breath. Negative for cough.   Cardiovascular: Positive for chest pain.  Gastrointestinal: Negative for abdominal pain and diarrhea.  Genitourinary: Negative for frequency and hematuria.  Musculoskeletal: Negative for back pain.  Skin: Negative for rash.  Neurological: Negative for seizures and headaches.  Hematological:  Negative.   Psychiatric/Behavioral: Negative for hallucinations.    Allergies  Penicillins  Home Medications   Current Outpatient Rx  Name  Route  Sig  Dispense  Refill  . CLOTRIMAZOLE 1 % EX CREA   Topical   Apply topically 2 (two) times daily.   30 g   0   . EMTRICITABINE-TENOFOVIR 200-300 MG PO TABS   Oral   Take 1 tablet by mouth daily.   30 tablet   5   . LISINOPRIL-HYDROCHLOROTHIAZIDE 20-25 MG PO TABS   Oral   Take 1 tablet by mouth daily.   30 tablet   prn   . RALTEGRAVIR POTASSIUM 400 MG PO TABS   Oral   Take 1 tablet (400 mg total) by mouth 2 (two) times daily.   60 tablet   5   . VALACYCLOVIR HCL 1 G PO TABS   Oral   Take 1 tablet (1,000 mg total) by mouth 3 (three) times daily.   60 tablet   1     BP 134/93  Pulse 67  Temp 97.9 F (36.6 C)  Resp 20  SpO2 99%  Physical Exam  Constitutional: He is oriented to person, place, and time. He appears well-developed.  HENT:  Head: Normocephalic and atraumatic.  Eyes: Conjunctivae normal and EOM are normal. No scleral icterus.  Neck: Neck supple. No thyromegaly present.  Cardiovascular: Normal rate and regular rhythm.  Exam reveals no gallop and no friction rub.   No murmur heard. Pulmonary/Chest: No stridor. He has no wheezes. He has no rales. He exhibits no tenderness.  Abdominal: He exhibits no distension.  There is no tenderness. There is no rebound.  Musculoskeletal: Normal range of motion. He exhibits no edema.  Lymphadenopathy:    He has no cervical adenopathy.  Neurological: He is oriented to person, place, and time. Coordination normal.  Skin: No rash noted. No erythema.  Psychiatric: He has a normal mood and affect. His behavior is normal.    ED Course  Procedures (including critical care time)   Labs Reviewed  CBC WITH DIFFERENTIAL  TROPONIN I  COMPREHENSIVE METABOLIC PANEL  PRO B NATRIURETIC PEPTIDE   No results found.   No diagnosis found.  Date: 10/17/2012  Rate: 72   Rhythm: sinus arrhythmia  QRS Axis: normal  Intervals: normal  ST/T Wave abnormalities: normal  Conduction Disutrbances:none  Narrative Interpretation:   Old EKG Reviewed: none available   MDM          Benny Lennert, MD 10/17/12 [phone]

## 2012-10-17 NOTE — H&P (Signed)
[facility] History and Physical  MERLON ALCORTA WUJ:[id number] DOB: 28-Dec-1965 DOA: 10/17/2012  Referring physician: Dr. Ignacia Palma PCP: No primary provider on file.  Specialists: Infectious Disease: Dr. Drue Second  Chief Complaint: chest pain  HPI: Colin Alexander is a 46 y.o. male with history of hypertension and HIV disease, who presents to the hospital with complaints of chest pain. Patient reports that he's been having on-and-off chest pain for the past 3-4 days. He had noticed that it had progressively gotten worse over the past day. Patient reports waking up with significant substernal/epigastric chest pain. This was associated shortness of breath, nausea, diaphoresis. Patient felt significantly lightheaded when standing and therefore tried not to move. He reports his pain is worse on deep inspiration and cough. He does have a dry cough. He works with the second and third graders and feels that he may have had sick contacts. He's not had any vomiting, diarrhea, abdominal pain. His discomfort is not any worse with food ingestion. He has not had any prior cardiac workup in the past. He's been referred for admission.  Review of Systems: Pertinent positives as per history of present illness, otherwise negative  Past Medical History  Diagnosis Date  . Hypertension   . HIV (human immunodeficiency virus infection)    Past Surgical History  Procedure Date  . Circumcision   . Hernia repair    Social History:  reports that he has never smoked. He has never used smokeless tobacco. He reports that he does not drink alcohol or use illicit drugs.   Allergies  Allergen Reactions  . Penicillins Shortness Of Breath and Rash    Family history: Positive for heart disease. Patient reports that multiple family members have had heart attacks in their 14s and 54s.  Prior to Admission medications   Medication Sig Start Date End Date Taking? Authorizing Provider  clotrimazole (LOTRIMIN) 1 % cream  Apply 1 application topically 2 (two) times daily as needed. For rash   Yes Historical Provider, MD  emtricitabine-tenofovir (TRUVADA) 200-300 MG per tablet Take 1 tablet by mouth daily. 06/28/12 06/28/13 Yes Judyann Munson, MD  lisinopril-hydrochlorothiazide (PRINZIDE,ZESTORETIC) 20-25 MG per tablet Take 1 tablet by mouth daily. 04/15/12  Yes Judyann Munson, MD  raltegravir (ISENTRESS) 400 MG tablet Take 1 tablet (400 mg total) by mouth 2 (two) times daily. 06/28/12 06/28/13 Yes Judyann Munson, MD  valACYclovir (VALTREX) 1000 MG tablet Take 1 tablet (1,000 mg total) by mouth 3 (three) times daily. 10/04/12 10/04/13 Yes Judyann Munson, MD   Physical Exam: Filed Vitals:   10/17/12 1136 10/17/12 1141 10/17/12 1226 10/17/12 1343  BP: 114/84 112/72 101/49 103/62  Pulse:   62 58  Temp:    98.1 F (36.7 C)  TempSrc:    Oral  Resp: 18 18  20   Height:    5\' 8"  (1.727 m)  Weight:    81.829 kg (180 lb 6.4 oz)  SpO2: 94% 94% 94% 96%     General:  Appears comfortable in bed. No signs of acute distress, appears systemically well.  Eyes: Pupils are equal round react to light  ENT: Mucous membranes are moist, no signs of thrush  Neck: Supple  Cardiovascular: S1, S2, regular rate and rhythm, no pedal edema, chest pain is nonreproducible on palpation  Respiratory: Clear to auscultation bilaterally  Abdomen: Soft, nontender, nondistended, bowel sounds are active  Skin: Normal  Musculoskeletal: Deferred  Psychiatric: Normal affect, cooperative with exam  Neurologic: Grossly intact, nonfocal  Labs on Admission:  Basic Metabolic Panel:  Lab 10/17/12 1610  NA 134*  K 3.8  CL 99  CO2 28  GLUCOSE 101*  BUN 12  CREATININE 1.12  CALCIUM 9.0  MG --  PHOS --   Liver Function Tests:  Lab 10/17/12 0725  AST 18  ALT 13  ALKPHOS 40  BILITOT 0.5  PROT 7.2  ALBUMIN 3.8   No results found for this basename: LIPASE:5,AMYLASE:5 in the last 168 hours No results found for this basename:  AMMONIA:5 in the last 168 hours CBC:  Lab 10/17/12 0700  WBC 4.6  NEUTROABS 2.6  HGB 15.6  HCT 43.7  MCV 98.0  PLT 204   Cardiac Enzymes:  Lab 10/17/12 0725  CKTOTAL --  CKMB --  CKMBINDEX --  TROPONINI <0.30    BNP (last 3 results)  Basename 10/17/12 0725  PROBNP <5.0   CBG: No results found for this basename: GLUCAP:5 in the last 168 hours  Radiological Exams on Admission: Ct Angio Chest Pe W/cm &/or Wo Cm  10/17/2012  *RADIOLOGY REPORT*  Clinical Data: Shortness of breath.  CT ANGIOGRAPHY CHEST  Technique:  Multidetector CT imaging of the chest using the standard protocol during bolus administration of intravenous contrast. Multiplanar reconstructed images including MIPs were obtained and reviewed to evaluate the vascular anatomy.  Contrast: OMNIPAQUE IOHEXOL 350 MG/ML SOLN  Comparison: Chest radiograph 10/17/2012  Findings: There is no evidence for pulmonary embolism.  There is no evidence for chest lymphadenopathy.  The heart size is normal.  No significant pericardial or pleural fluid.  Images of the upper abdomen are unremarkable.  The trachea and mainstem bronchi are patent.  The patient appears rotated in the region of the cervicothoracic junction.  There is mild dependent atelectasis in the lower lobes.  No evidence for airspace disease or lung consolidation.  No acute bony abnormalities.  IMPRESSION: Negative for pulmonary embolism.  No acute chest findings.   Original Report Authenticated By: Richarda Overlie, M.D.    Dg Chest Portable 1 View  10/17/2012  *RADIOLOGY REPORT*  Clinical Data: Mid back pain.  PORTABLE CHEST - 1 VIEW  Comparison: None.  Findings: Single view of the chest demonstrates clear lungs. Heart and mediastinum are within normal limits.  The trachea is midline. Bony thorax is intact.  IMPRESSION: No acute cardiopulmonary disease.   Original Report Authenticated By: Richarda Overlie, M.D.     EKG: Independently reviewed. No acute ST or T  changes  Assessment/Plan Principal Problem:  *Chest pain Active Problems:  HTN (hypertension)  HIV (human immunodeficiency virus infection)   1. Chest pain. Patient has not had any prior cardiac workup. He'll be admitted to the hospital for observation. We will cycle his cardiac markers. Check 2D echocardiogram in the morning. We'll repeat EKG in the morning. His CT imaging of the chest was negative for pulmonary embolus. We will request a cardiology consultation to see the patient requires further stress testing. We will also order lipid panel and hemoglobin A1c to further risk stratify him. Patient will be started on aspirin. We'll use nitroglycerin when necessary for chest pain 2. Hypertension. Continue outpatient meds. 3. Dizziness. Check orthostatics. Give IV fluids 4. HIV. We will continue his outpatient regimen. Patient has not disclosed this to his family.   Code Status: full code Family Communication: discussed with patient at bedside Disposition Plan: discharge home once work up is complete  Time spent:  MEMON,JEHANZEB [facility] Pager [phone]  If 7PM-7AM, please contact night-coverage [email or web address] Password  [email or web address] 10/17/2012, 4:46 PM

## 2012-10-17 NOTE — ED Notes (Signed)
Attempt to call report; accepting RN busy and will call back.

## 2012-10-18 ENCOUNTER — Encounter (HOSPITAL_COMMUNITY): Payer: Self-pay | Admitting: Adult Health

## 2012-10-18 DIAGNOSIS — Z21 Asymptomatic human immunodeficiency virus [HIV] infection status: Secondary | ICD-10-CM

## 2012-10-18 DIAGNOSIS — Z8249 Family history of ischemic heart disease and other diseases of the circulatory system: Secondary | ICD-10-CM

## 2012-10-18 DIAGNOSIS — I10 Essential (primary) hypertension: Secondary | ICD-10-CM

## 2012-10-18 DIAGNOSIS — R0789 Other chest pain: Secondary | ICD-10-CM | POA: Diagnosis present

## 2012-10-18 DIAGNOSIS — I517 Cardiomegaly: Secondary | ICD-10-CM

## 2012-10-18 LAB — BASIC METABOLIC PANEL
BUN: 9 mg/dL (ref 6–23)
CO2: 30 mEq/L (ref 19–32)
Chloride: 102 mEq/L (ref 96–112)
Creatinine, Ser: 1.07 mg/dL (ref 0.50–1.35)
Glucose, Bld: 114 mg/dL — ABNORMAL HIGH (ref 70–99)

## 2012-10-18 LAB — CBC
HCT: 45.1 % (ref 39.0–52.0)
MCH: 34.6 pg — ABNORMAL HIGH (ref 26.0–34.0)
MCHC: 35.3 g/dL (ref 30.0–36.0)
MCV: 98.3 fL (ref 78.0–100.0)
RDW: 12.2 % (ref 11.5–15.5)

## 2012-10-18 LAB — TROPONIN I: Troponin I: <0.3 ng/mL (ref <0.30)

## 2012-10-18 MED ORDER — BENZONATATE 100 MG PO CAPS
100.0000 mg | ORAL_CAPSULE | Freq: Three times a day (TID) | ORAL | Status: DC
  Administered 2012-10-18 – 2012-10-19 (×2): 100 mg via ORAL
  Filled 2012-10-18 (×2): qty 1

## 2012-10-18 MED ORDER — PANTOPRAZOLE SODIUM 40 MG PO TBEC
40.0000 mg | DELAYED_RELEASE_TABLET | Freq: Two times a day (BID) | ORAL | Status: DC
  Administered 2012-10-19: 40 mg via ORAL
  Filled 2012-10-18: qty 1

## 2012-10-18 NOTE — Progress Notes (Signed)
*  PRELIMINARY RESULTS* Echocardiogram 2D Echocardiogram has been performed.  Conrad [place] 10/18/2012, 11:49 AM

## 2012-10-18 NOTE — Progress Notes (Signed)
UR Chart Review Completed  

## 2012-10-18 NOTE — Care Management Note (Unsigned)
    Page 1 of 1   10/18/2012     11:36:21 AM   CARE MANAGEMENT NOTE 10/18/2012  Patient:  Colin Alexander, Colin Alexander   Account Number:  [id number]  Date Initiated:  10/18/2012  Documentation initiated by:  Sharrie Rothman  Subjective/Objective Assessment:   Pt admitted from home with CP. Pt lives with his father and will return home with him at discharge. Pt is independent with ADL's.     Action/Plan:   No CM or HH needs noted.   Anticipated DC Date:  10/19/2012   Anticipated DC Plan:  HOME/SELF CARE      DC Planning Services  CM consult      Choice offered to / List presented to:             Status of service:  Completed, signed off Medicare Important Message given?   (If response is "NO", the following Medicare IM given date fields will be blank) Date Medicare IM given:   Date Additional Medicare IM given:    Discharge Disposition:    Per UR Regulation:    If discussed at Long Length of Stay Meetings, dates discussed:    Comments:  10/18/12 1135 Arlyss Queen, RN BSN CM

## 2012-10-18 NOTE — Consult Note (Signed)
CARDIOLOGY CONSULT NOTE  [name] MRN: [id number] DOB/AGE: [birth year] 46 y.o.  Admit date: 10/17/2012 Referring Physician: PTH - Memon Primary Physician: Ilsa Iha (Infectious Diseases) Consulting Cardiologist: Dr. Diona Browner Reason for Consultation: Chest pain   HPI: Colin Alexander ia 46 y/o male with no known cardiac history but with strong family history of CAD (mother with CAD and stents, father with CABG in his 68's, cousin who died of MI in his 67's.) The patient has history of hypertension, GERD and recently diagnosed HIV (early this year) followed by ID in [place].   He was in his usual state of health when he experienced chest tightness and burning substernal, radiating into his back between his shoulder blades. This transient and uncomfortable at most. He ate some pizza later that evening and stayed up to avoid GERD symptoms. He began to feel more pressure and tightness around 3 am while still awake, with some associated dyspnea. He also had dizziness. Tried to sleep but discomfort continued on and off, leading him to have family member bring him to the ER. In ER, BP 134/93, HR 67. Potassium 3.8.He was not anemic. CXR demonstrated no acute cardiopulmonary process. EKG showed normal sinus rhythm without evidence of acute ischemia. Cardiac enzymes have been negative  X 4. He was negative for PE. We are asked for cardiac recommendations.   The patient describes the pain as "a large potato in his chest turned sideways" and sticking through to his back. He has not been seen by GI recently, but has had EGD in the past in [place]. He denies trouble swallowing, but did have recurrence of pain after taking his morning medications.        Review of systems complete and found to be negative unless listed above   Past Medical History  Diagnosis Date  . Hypertension   . HIV (human immunodeficiency virus infection)     Family History  Problem Relation Age of Onset  . Heart attack  Mother     Stent placement  . Heart attack Father     CABG  . Hypertension Mother   . Hypertension Father   . Heart attack Cousin     In his 30's deceased.    History   Social History  . Marital Status: Divorced    Spouse Name: N/A    Number of Children: N/A  . Years of Education: N/A   Occupational History  . Not on file.   Social History Main Topics  . Smoking status: Never Smoker   . Smokeless tobacco: Never Used  . Alcohol Use: No  . Drug Use: No  . Sexually Active: Yes    Birth Control/ Protection: Condom   Other Topics Concern  . Not on file   Social History Narrative  . No narrative on file    Past Surgical History  Procedure Date  . Circumcision   . Hernia repair      Prescriptions prior to admission  Medication Sig Dispense Refill  . clotrimazole (LOTRIMIN) 1 % cream Apply 1 application topically 2 (two) times daily as needed. For rash      . emtricitabine-tenofovir (TRUVADA) 200-300 MG per tablet Take 1 tablet by mouth daily.  30 tablet  5  . lisinopril-hydrochlorothiazide (PRINZIDE,ZESTORETIC) 20-25 MG per tablet Take 1 tablet by mouth daily.  30 tablet  prn  . raltegravir (ISENTRESS) 400 MG tablet Take 1 tablet (400 mg total) by mouth 2 (two) times daily.  60 tablet  5  .  valACYclovir (VALTREX) 1000 MG tablet Take 1 tablet (1,000 mg total) by mouth 3 (three) times daily.  60 tablet  1    Physical Exam: Blood pressure 130/97, pulse 60, temperature 97.7 F (36.5 C), temperature source Oral, resp. rate 20, height 5\' 8"  (1.727 m), weight 180 lb 6.4 oz (81.829 kg), SpO2 98.00%.   General: Well developed, well nourished, in no acute distress Head: Eyes PERRLA, No xanthomas.   Normal cephalic and atramatic  Lungs: Clear bilaterally to auscultation and percussion. Heart: HRRR S1 S2, without MRG.  Pulses are 2+ & equal.            No carotid bruit. No JVD.  No abdominal bruits. No femoral bruits. Abdomen: Bowel sounds are positive, abdomen soft and  non-tender without masses or                  Hernia's noted. Msk:  Back normal, normal gait. Normal strength and tone for age. Extremities: No clubbing, cyanosis or edema.  DP +1 Neuro: Alert and oriented X 3. Psych:  Good affect, responds appropriately  Labs:   Lab Results  Component Value Date   WBC 2.8* 10/18/2012   HGB 15.9 10/18/2012   HCT 45.1 10/18/2012   MCV 98.3 10/18/2012   PLT 192 10/18/2012     Lab 10/18/12 0654 10/17/12 0725  NA 138 --  K 3.7 --  CL 102 --  CO2 30 --  BUN 9 --  CREATININE 1.07 --  CALCIUM 9.1 --  PROT -- 7.2  BILITOT -- 0.5  ALKPHOS -- 40  ALT -- 13  AST -- 18  GLUCOSE 114* --   Lab Results  Component Value Date   TROPONINI <0.30 10/18/2012    Lab Results  Component Value Date   CHOL 124 10/17/2012   CHOL 139 12/30/2011   Lab Results  Component Value Date   HDL 40 10/17/2012   HDL 37* 12/30/2011   Lab Results  Component Value Date   LDLCALC 74 10/17/2012   LDLCALC 92 12/30/2011   Lab Results  Component Value Date   TRIG 49 10/17/2012   TRIG 51 12/30/2011   Lab Results  Component Value Date   CHOLHDL 3.1 10/17/2012   CHOLHDL 3.8 12/30/2011   No results found for this basename: LDLDIRECT      Radiology: Ct Angio Chest Pe W/cm &/or Wo Cm  10/17/2012  *RADIOLOGY REPORT*  Clinical Data: Shortness of breath.  CT ANGIOGRAPHY CHEST  Technique:  Multidetector CT imaging of the chest using the standard protocol during bolus administration of intravenous contrast. Multiplanar reconstructed images including MIPs were obtained and reviewed to evaluate the vascular anatomy.  Contrast: OMNIPAQUE IOHEXOL 350 MG/ML SOLN  Comparison: Chest radiograph 10/17/2012  Findings: There is no evidence for pulmonary embolism.  There is no evidence for chest lymphadenopathy.  The heart size is normal.  No significant pericardial or pleural fluid.  Images of the upper abdomen are unremarkable.  The trachea and mainstem bronchi are patent.  The patient appears  rotated in the region of the cervicothoracic junction.  There is mild dependent atelectasis in the lower lobes.  No evidence for airspace disease or lung consolidation.  No acute bony abnormalities.  IMPRESSION: Negative for pulmonary embolism.  No acute chest findings.   Original Report Authenticated By: Richarda Overlie, M.D.    Dg Chest Portable 1 View  10/17/2012  *RADIOLOGY REPORT*  Clinical Data: Mid back pain.  PORTABLE CHEST - 1 VIEW  Comparison: None.  Findings: Single view of the chest demonstrates clear lungs. Heart and mediastinum are within normal limits.  The trachea is midline. Bony thorax is intact.  IMPRESSION: No acute cardiopulmonary disease.   Original Report Authenticated By: Richarda Overlie, M.D.    EKG:NSR  1. Chest Pain: Typical and atypical features with negative CE and nonacute EKG. Does have risk factors of family history and hypertension. Cholesterol studies done on this admission are WNL. Echocardiogram has been requested. Patient may benefit from stress test for diagnostic/prognositic purposes either as IP or OP.. If echo is abnormal, may consider more invasive testing.  If negative Echo, may need to consider GI consult.  2. Hypertension: Blood pressure is well controlled on lisioopril.HCTZ  home medication which is continued on admission.    ASSESSMENT AND PLAN:  Signed: Bettey Mare. Lyman Bishop NP [facility] 10/18/2012, 11:27 AM Co-Sign MD  Attending note:  Patient seen and examined. Above note per Joni Reining NP modified by me. Patient presents with recent onset atypical chest pain, waxing and waning, at times worse when he lies down, describes a dullness in his chest, also spreading into his back. No fevers or chills, no cough. His chest x-ray did not demonstrate any acute process, ECG shows no acute ST segment abnormalities, and cardiac markers are normal at this time. He also had a chest CT angiogram and x-ray no acute abnormalities.  Baseline history includes  hypertension and HIV. He reports compliance with his medications. Has no typical exertional chest pain.  On examination he reports recurrent chest pain this morning. Afebrile, blood pressure 130/90, heart rate 60s and in sinus rhythm by telemetry. Lungs are clear, cardiac exam reveals regular rate and rhythm without rub or gallop, extremities show no pitting edema.  Lab work reviewed finding potassium 3.7, creatinine 1.0, WBC 2.8, hemoglobin 15.9, platelets 192.  Patient with recurring chest pain, largely atypical in description, although with impressive family history of premature CAD, also hypertension and HIV. Symptoms could be GI in etiology although not definitively so. He is having an echocardiogram today and if no wall motion abnormalities are noted, we will schedule stress testing for tomorrow morning for basic ischemic evaluation in light of his risk factor profile and recurring chest pain symptoms. If this is negative, would consider GI workup.  Jonelle Sidle, M.D., F.A.C.C.

## 2012-10-18 NOTE — Progress Notes (Signed)
[facility]             Progress Note   Subjective: Patient is a relatively well today when he developed chest pain after coughing. Patient reports that he had sharp stabbing pain after long period of coughing. He has been coughing more today, having approximately 6 episodes today. It is a nonproductive cough. He also has noted some clear nasal drainage.  Objective: Vital signs in last 24 hours: Temp:  [97.5 F (36.4 C)-97.8 F (36.6 C)] 97.8 F (36.6 C) (12/09 1430) Pulse Rate:  [60-73] 63  (12/09 1430) Resp:  [20] 20  (12/09 1430) BP: (106-130)/(71-97) 110/83 mmHg (12/09 1430) SpO2:  [97 %-98 %] 98 % (12/09 1430) Weight change:  Last BM Date: 10/17/12  Intake/Output from previous day: 12/08 0701 - 12/09 0700 In: 240 [P.O.:240] Out: 1200 [Urine:1200] Total I/O In: -  Out: 500 [Urine:500]   Physical Exam: General: Alert, awake, oriented x3, in no acute distress. HEENT: No bruits, no goiter. Heart: Regular rate and rhythm, without murmurs, rubs, gallops. Tender to palpation over the sternum Lungs: Clear to auscultation bilaterally. Abdomen: Soft, nontender, nondistended, positive bowel sounds. Extremities: No clubbing cyanosis or edema with positive pedal pulses. Neuro: Grossly intact, nonfocal.    Lab Results: Basic Metabolic Panel:  Basename 10/18/12 0654 10/17/12 0725  NA 138 134*  K 3.7 3.8  CL 102 99  CO2 30 28  GLUCOSE 114* 101*  BUN 9 12  CREATININE 1.07 1.12  CALCIUM 9.1 9.0  MG -- --  PHOS -- --   Liver Function Tests:  [facility] 10/17/12 0725  AST 18  ALT 13  ALKPHOS 40  BILITOT 0.5  PROT 7.2  ALBUMIN 3.8    Basename 10/17/12 1641  LIPASE 38  AMYLASE --   No results found for this basename: AMMONIA:2 in the last 72 hours CBC:  Basename 10/18/12 0654 10/17/12 0700  WBC 2.8* 4.6  NEUTROABS -- 2.6  HGB 15.9 15.6  HCT 45.1 43.7  MCV 98.3 98.0  PLT 192 204   Cardiac Enzymes:  Basename 10/18/12 0654 10/18/12 0005  10/17/12 1805  CKTOTAL -- -- --  CKMB -- -- --  CKMBINDEX -- -- --  TROPONINI <0.30 <0.30 <0.30   BNP:  Basename 10/17/12 0725  PROBNP <5.0   D-Dimer: No results found for this basename: DDIMER:2 in the last 72 hours CBG: No results found for this basename: GLUCAP:6 in the last 72 hours Hemoglobin A1C:  Basename 10/17/12 1641  HGBA1C 4.8   Fasting Lipid Panel:  Basename 10/17/12 0725  CHOL 124  HDL 40  LDLCALC 74  TRIG 49  CHOLHDL 3.1  LDLDIRECT --   Thyroid Function Tests: No results found for this basename: TSH,T4TOTAL,FREET4,T3FREE,THYROIDAB in the last 72 hours Anemia Panel: No results found for this basename: VITAMINB12,FOLATE,FERRITIN,TIBC,IRON,RETICCTPCT in the last 72 hours Coagulation: No results found for this basename: LABPROT:2,INR:2 in the last 72 hours Urine Drug Screen: Drugs of Abuse  No results found for this basename: labopia, cocainscrnur, labbenz, amphetmu, thcu, labbarb    Alcohol Level: No results found for this basename: ETH:2 in the last 72 hours Urinalysis: No results found for this basename: COLORURINE:2,APPERANCEUR:2,LABSPEC:2,PHURINE:2,GLUCOSEU:2,HGBUR:2,BILIRUBINUR:2,KETONESUR:2,PROTEINUR:2,UROBILINOGEN:2,NITRITE:2,LEUKOCYTESUR:2 in the last 72 hours  No results found for this or any previous visit (from the past 240 hour(s)).  Studies/Results: Ct Angio Chest Pe W/cm &/or Wo Cm  10/17/2012  *RADIOLOGY REPORT*  Clinical Data: Shortness of breath.  CT ANGIOGRAPHY CHEST  Technique:  Multidetector CT imaging of the chest using  the standard protocol during bolus administration of intravenous contrast. Multiplanar reconstructed images including MIPs were obtained and reviewed to evaluate the vascular anatomy.  Contrast: OMNIPAQUE IOHEXOL 350 MG/ML SOLN  Comparison: Chest radiograph 10/17/2012  Findings: There is no evidence for pulmonary embolism.  There is no evidence for chest lymphadenopathy.  The heart size is normal.  No significant  pericardial or pleural fluid.  Images of the upper abdomen are unremarkable.  The trachea and mainstem bronchi are patent.  The patient appears rotated in the region of the cervicothoracic junction.  There is mild dependent atelectasis in the lower lobes.  No evidence for airspace disease or lung consolidation.  No acute bony abnormalities.  IMPRESSION: Negative for pulmonary embolism.  No acute chest findings.   Original Report Authenticated By: Richarda Overlie, M.D.    Dg Chest Portable 1 View  10/17/2012  *RADIOLOGY REPORT*  Clinical Data: Mid back pain.  PORTABLE CHEST - 1 VIEW  Comparison: None.  Findings: Single view of the chest demonstrates clear lungs. Heart and mediastinum are within normal limits.  The trachea is midline. Bony thorax is intact.  IMPRESSION: No acute cardiopulmonary disease.   Original Report Authenticated By: Richarda Overlie, M.D.     Medications: Scheduled Meds:   . [COMPLETED] sodium chloride   Intravenous STAT  . aspirin EC  325 mg Oral Daily  . benzonatate  100 mg Oral TID  . emtricitabine-tenofovir  1 tablet Oral Daily  . enoxaparin (LOVENOX) injection  40 mg Subcutaneous Q24H  . lisinopril  20 mg Oral Daily   And  . hydrochlorothiazide  25 mg Oral Daily  . pantoprazole  40 mg Oral BID AC  . raltegravir  400 mg Oral BID  . sodium chloride  3 mL Intravenous Q12H  . valACYclovir  1,000 mg Oral TID   Continuous Infusions:   . 0.9 % NaCl with KCl 20 mEq / L 75 mL/hr at 10/18/12 0755   PRN Meds:.acetaminophen, acetaminophen, morphine injection, nitroGLYCERIN, ondansetron (ZOFRAN) IV, ondansetron  Assessment/Plan:  Principal Problem:  *Chest pain Active Problems:  HTN (hypertension)  HIV (human immunodeficiency virus infection)  Atypical chest pain  Family history of premature CAD  1. Chest pain. Appreciate cardiology assistance. Due to his risk factors and family history of premature coronary artery disease, he'll undergo stress test in the morning. It is  possible that the etiology of his pain is secondary to a developing bronchitis as well as costochondritis. We will treat the patient with Jerilynn Som for his cough was aggravating his pain. We will also start him on Protonix for any element of GERD which is contributing to his cough. If stress test is negative, then I think it would be appropriate to pursue an outpatient GI evaluation. Likely discharge home tomorrow if stress test is unremarkable.  Time spent coordinating care:   LOS: 1 day   MEMON,JEHANZEB [facility] Pager: [phone] 10/18/2012, 6:22 PM

## 2012-10-19 ENCOUNTER — Ambulatory Visit: Payer: Self-pay | Admitting: Internal Medicine

## 2012-10-19 ENCOUNTER — Encounter (HOSPITAL_COMMUNITY): Payer: Self-pay

## 2012-10-19 ENCOUNTER — Observation Stay (HOSPITAL_COMMUNITY): Payer: Self-pay

## 2012-10-19 ENCOUNTER — Encounter (HOSPITAL_COMMUNITY): Payer: Self-pay | Admitting: Cardiology

## 2012-10-19 DIAGNOSIS — R079 Chest pain, unspecified: Secondary | ICD-10-CM

## 2012-10-19 DIAGNOSIS — J40 Bronchitis, not specified as acute or chronic: Secondary | ICD-10-CM | POA: Diagnosis present

## 2012-10-19 MED ORDER — PANTOPRAZOLE SODIUM 40 MG PO TBEC: 40.0000 mg | DELAYED_RELEASE_TABLET | Freq: Every day | ORAL | Status: DC

## 2012-10-19 MED ORDER — TECHNETIUM TC 99M SESTAMIBI GENERIC - CARDIOLITE
10.0000 | Freq: Once | INTRAVENOUS | Status: AC | PRN
  Administered 2012-10-19: 8.5 via INTRAVENOUS

## 2012-10-19 MED ORDER — TECHNETIUM TC 99M SESTAMIBI - CARDIOLITE
30.0000 | Freq: Once | INTRAVENOUS | Status: AC | PRN
Start: 2012-10-19 — End: 2012-10-19
  Administered 2012-10-19: 10:00:00 28 via INTRAVENOUS

## 2012-10-19 MED ORDER — BENZONATATE 100 MG PO CAPS: 100.0000 mg | ORAL_CAPSULE | Freq: Three times a day (TID) | ORAL | Status: DC

## 2012-10-19 MED ORDER — GUAIFENESIN ER 600 MG PO TB12: 1200.0000 mg | ORAL_TABLET | Freq: Two times a day (BID) | ORAL | Status: DC

## 2012-10-19 NOTE — Progress Notes (Signed)
Stress Lab Nurses Notes - Colin Alexander  Colin Alexander 10/19/2012 Reason for doing test: Chest Pain Type of test: Stress Cardiolite Nurse performing test: Parke Poisson, RN Nuclear Medicine Tech: Lou Cal Echo Tech: Not Applicable MD performing test: Ival Bible & Joni Reining NP Family MD: Dr. Ilsa Iha Test explained and consent signed: yes IV started: 20g jelco, IV in progress from floor and No redness or edema Symptoms: SOB & dizziness Treatment/Intervention: None Reason test stopped: fatigue After recovery IV was: Left intact (rate of 25) and No redness or edema Patient to return to Nuc. Med at : 10:45 Patient discharged: Transported back to room 213 via WC Patient's Condition upon discharge was: stable Comments: During test peak BP 170/96 & HR 166.  Recovery BP 143/97 & HR 84.  Continues to have some chest discomfort. Symptoms resolved in recovery. Erskine Speed T

## 2012-10-19 NOTE — Progress Notes (Signed)
   Consulting cardiologist: Dr. Nona Dell  SUBJECTIVE:Some intermittent chest discomfort overnight. None on interview in nuclear medicine department.  LABS: Basic Metabolic Panel:  Basename 10/18/12 0654 10/17/12 0725  NA 138 134*  K 3.7 3.8  CL 102 99  CO2 30 28  GLUCOSE 114* 101*  BUN 9 12  CREATININE 1.07 1.12  CALCIUM 9.1 9.0  MG -- --  PHOS -- --   Liver Function Tests:  [facility] 10/17/12 0725  AST 18  ALT 13  ALKPHOS 40  BILITOT 0.5  PROT 7.2  ALBUMIN 3.8    Basename 10/17/12 1641  LIPASE 38  AMYLASE --   CBC:  Basename 10/18/12 0654 10/17/12 0700  WBC 2.8* 4.6  NEUTROABS -- 2.6  HGB 15.9 15.6  HCT 45.1 43.7  MCV 98.3 98.0  PLT 192 204   Cardiac Enzymes:  Basename 10/18/12 0654 10/18/12 0005 10/17/12 1805  CKTOTAL -- -- --  CKMB -- -- --  CKMBINDEX -- -- --  TROPONINI <0.30 <0.30 <0.30   Hemoglobin A1C:  Basename 10/17/12 1641  HGBA1C 4.8   Fasting Lipid Panel:  Basename 10/17/12 0725  CHOL 124  HDL 40  LDLCALC 74  TRIG 49  CHOLHDL 3.1  LDLDIRECT --   Echocardiogram:10/18/2012 Left ventricle: The cavity size was normal. Wall thickness was increased in a pattern of mild LVH. Systolic function was normal. The estimated ejection fraction was in the range of 60% to 65%. Wall motion was normal; there were no regional wall motion abnormalities. Features are consistent with a pseudonormal left ventricular filling pattern, with concomitant abnormal relaxation and increased filling pressure (grade 2 diastolic dysfunction). - Aorta: Aortic root dimension: 40.40mm (ED). - Aortic root: The aortic root was mildly dilated. - Mitral valve: Trivial regurgitation. - Tricuspid valve: Trivial regurgitation. - Pericardium, extracardiac: There was no pericardial effusion.   PHYSICAL EXAM BP 117/77  Pulse 60  Temp 97.8 F (36.6 C) (Oral)  Resp 20  Ht 5\' 8"  (1.727 m)  Wt 180 lb 6.4 oz (81.829 kg)  BMI 27.43 kg/m2  SpO2 94%  General:  No acute distress  Lungs: Clear bilaterally to auscultation and percussion. Heart: RRR S1 S2, No MRG Extremities: No clubbing, cyanosis or edema.  DP +1  TELEMETRY: Reviewed telemetry pt in NSR   ASSESSMENT AND PLAN:  1. Atypical chest pain: Has had recurrence overnight. Plan stress myoview this am. Cardiac enzymes are negative. Echo shows normal LV fx, with Grade II diastolic dysfunction. Continue lisinopril and HCTZ. If stress test is negative can likely go home. GI evaluation may be appropriate.   2. Hypertension: As above. Good control during hospitalization.  Bettey Mare. Lyman Bishop NP [facility] 10/19/2012, 8:28 AM  Attending note:  Patient seen and examined. Comfortable in nuclear medicine after treadmill. Did have some chest pain overnight. Cardiac markers negative, ECG not acute, and rhythm stable by telemetry. Echocardiogram shows normal LV function without WMA, diastolic dysfunction noted, and mild aortic root dilatation - no mention of mediastinal widening by CXR and no comment on unusual vascularity by chest CTA (done to rule out PE). Will followup on stress Myoview today/  Jonelle Sidle, M.D., F.A.C.C.

## 2012-10-19 NOTE — Progress Notes (Signed)
Discharge instructions given on medications,and follow up visits,patient verbalized understanding.Prescriptions sent  With patient.No c/o pain or discomfort noted.Accompanied by staff to an awaiting vehicle.

## 2012-10-19 NOTE — Discharge Summary (Signed)
Physician Discharge Summary  AVERI CACIOPPO ZOX:[id number] DOB: 09/11/1966 DOA: 10/17/2012  PCP: No primary provider on file.  Admit date: 10/17/2012 Discharge date: 10/19/2012  Time spent: 35 minutes  Recommendations for Outpatient Follow-up:  1. Follow up with primary care physician in 1-2 weeks 2. Consider GI follow up if symptoms do not resolve  Discharge Diagnoses:  Principal Problem:  *Chest pain Active Problems:  HTN (hypertension)  HIV (human immunodeficiency virus infection)  Atypical chest pain  Family history of premature CAD  Bronchitis   Discharge Condition: stable  Diet recommendation: low salt  Filed Weights   10/17/12 1343  Weight: 81.829 kg (180 lb 6.4 oz)    History of present illness:  Colin Alexander is a 46 y.o. male with history of hypertension and HIV disease, who presents to the hospital with complaints of chest pain. Patient reports that he's been having on-and-off chest pain for the past 3-4 days. He had noticed that it had progressively gotten worse over the past day. Patient reports waking up with significant substernal/epigastric chest pain. This was associated shortness of breath, nausea, diaphoresis. Patient felt significantly lightheaded when standing and therefore tried not to move. He reports his pain is worse on deep inspiration and cough. He does have a dry cough. He works with the second and third graders and feels that he may have had sick contacts. He's not had any vomiting, diarrhea, abdominal pain. His discomfort is not any worse with food ingestion. He has not had any prior cardiac workup in the past. He's been referred for admission.   Hospital Course:  This gentleman was admitted to the hospital with chest pain. His restrictors included hypertension, nitroglycerin family history premature coronary disease. He for acute MI with negative cardiac markers and a nonacute EKG. He was seen by cardiology and underwent exercise stress test. This  did not reveal any signs of ischemia or scar. It was felt unlikely for the pain to be cardiac in origin. Patient also had a CT angio of the chest in the ER which ruled out any pulmonary embolus. He does report having somewhat of a cough as well as runny nose. He feels his pain gets worse when he coughs. It is possible that his pain is related to an underlying viral bronchitis and that his pain is pleuritic in nature. He's been given supportive treatment for bronchitis and has been advised to keep himself hydrated. He is also started on protonix for any element of GERD which could be contributing to his pain and cough. He's been advised to follow up with primary care physician 1-2 weeks. If his symptoms do not resolve, then outpatient GI consultation may be warranted.  Procedures:  See stress test results as below  Consultations:  [facility]  Discharge Exam: Filed Vitals:   10/18/12 1430 10/18/12 2058 10/19/12 0437 10/19/12 1400  BP: 110/83 121/79 117/77 134/89  Pulse: 63 78 60 71  Temp: 97.8 F (36.6 C) 98.6 F (37 C) 97.8 F (36.6 C) 97.9 F (36.6 C)  TempSrc: Oral Oral Oral Oral  Resp: 20 20 20 20   Height:      Weight:      SpO2: 98% 94% 94% 95%    General: NAD Cardiovascular: s1, s2, rrr Respiratory: cta b  Discharge Instructions  Discharge Orders    Future Orders Please Complete By Expires   Diet - low sodium heart healthy      Increase activity slowly      Call MD  for:  persistant nausea and vomiting      Call MD for:  severe uncontrolled pain      Call MD for:  difficulty breathing, headache or visual disturbances          Medication List     As of 10/19/2012  4:52 PM    TAKE these medications         benzonatate 100 MG capsule   Commonly known as: TESSALON   Take 1 capsule (100 mg total) by mouth 3 (three) times daily.      clotrimazole 1 % cream   Commonly known as: LOTRIMIN   Apply 1 application topically 2 (two) times daily as needed. For rash       emtricitabine-tenofovir 200-300 MG per tablet   Commonly known as: TRUVADA   Take 1 tablet by mouth daily.      guaiFENesin 600 MG 12 hr tablet   Commonly known as: MUCINEX   Take 2 tablets (1,200 mg total) by mouth 2 (two) times daily.      lisinopril-hydrochlorothiazide 20-25 MG per tablet   Commonly known as: PRINZIDE,ZESTORETIC   Take 1 tablet by mouth daily.      pantoprazole 40 MG tablet   Commonly known as: PROTONIX   Take 1 tablet (40 mg total) by mouth daily.      raltegravir 400 MG tablet   Commonly known as: ISENTRESS   Take 1 tablet (400 mg total) by mouth 2 (two) times daily.      valACYclovir 1000 MG tablet   Commonly known as: VALTREX   Take 1 tablet (1,000 mg total) by mouth 3 (three) times daily.          The results of significant diagnostics from this hospitalization (including imaging, microbiology, ancillary and laboratory) are listed below for reference.    Significant Diagnostic Studies: Ct Angio Chest Pe W/cm &/or Wo Cm  10/17/2012  *RADIOLOGY REPORT*  Clinical Data: Shortness of breath.  CT ANGIOGRAPHY CHEST  Technique:  Multidetector CT imaging of the chest using the standard protocol during bolus administration of intravenous contrast. Multiplanar reconstructed images including MIPs were obtained and reviewed to evaluate the vascular anatomy.  Contrast: OMNIPAQUE IOHEXOL 350 MG/ML SOLN  Comparison: Chest radiograph 10/17/2012  Findings: There is no evidence for pulmonary embolism.  There is no evidence for chest lymphadenopathy.  The heart size is normal.  No significant pericardial or pleural fluid.  Images of the upper abdomen are unremarkable.  The trachea and mainstem bronchi are patent.  The patient appears rotated in the region of the cervicothoracic junction.  There is mild dependent atelectasis in the lower lobes.  No evidence for airspace disease or lung consolidation.  No acute bony abnormalities.  IMPRESSION: Negative for pulmonary  embolism.  No acute chest findings.   Original Report Authenticated By: Richarda Overlie, M.D.    Nm Myocar Single W/planar W/wall Motion And Ef  10/19/2012  Ordering Physician: Joni Reining  Reading Physician: Jonelle Sidle  Clinical Data: 46 year old male with family history of CAD, GERD, HIV, presents with atypical chest pain.  This study is requested to evaluate for the presence of ischemia.  NUCLEAR MEDICINE STRESS MYOVIEW STUDY WITH SPECT AND LEFT VENTRICULAR EJECTION FRACTION  Radionuclide Data: One-day rest/stress protocol performed with 10/30 mCi of Tc-35m Myoview.  Stress Data: The patient was exercised on a Bruce protocol for 9 minutes and 28 seconds achieving a maximum workload of 11.6 mets. Peak heart rate was 169 beats  per minute, 97% of the maximum age predicted heart rate response.  Blood pressure increased from 120/95 up to 170/96.  No clearly diagnostic ST-segment abnormalities were noted and there were no arrhythmias.  EKG: Baseline tracing shows sinus rhythm at 65 with early repolarization pattern, decreased R-wave progression.  Scintigraphic Data: Analysis of the raw perfusion data finds adequate radiotracer uptake with some chest wall soft tissue attenuation.  Tomographic views obtained using the short axis, vertical long axis, and horizontal long axis planes.  There is a small anteroapical defect of mild to moderate intensity that is fixed, actually more prominent at rest than at stress.  This is most consistent with attenuation.  No clear evidence of focal ischemia.  Gated imaging reveals an EDV of 86, ESV of 36, T I D ratio of 0.89, and LVEF of 58% without wall motion abnormality.  IMPRESSION: Overall low risk exercise Myoview as outlined.  There were no clearly diagnostic ST-segment abnormalities at a maximum workload of 11.6 mets.  He did experience chest pain during the study.  No arrhythmias were noted.  Perfusion imaging shows evidence of soft tissue attenuation as noted, no clear  evidence of scar or ischemia. LVEF is normal 58%.   Original Report Authenticated By: Nona Dell    Dg Chest Portable 1 View  10/17/2012  *RADIOLOGY REPORT*  Clinical Data: Mid back pain.  PORTABLE CHEST - 1 VIEW  Comparison: None.  Findings: Single view of the chest demonstrates clear lungs. Heart and mediastinum are within normal limits.  The trachea is midline. Bony thorax is intact.  IMPRESSION: No acute cardiopulmonary disease.   Original Report Authenticated By: Richarda Overlie, M.D.     Microbiology: No results found for this or any previous visit (from the past 240 hour(s)).   Labs: Basic Metabolic Panel:  Lab 10/18/12 1478 10/17/12 0725  NA 138 134*  K 3.7 3.8  CL 102 99  CO2 30 28  GLUCOSE 114* 101*  BUN 9 12  CREATININE 1.07 1.12  CALCIUM 9.1 9.0  MG -- --  PHOS -- --   Liver Function Tests:  Lab 10/17/12 0725  AST 18  ALT 13  ALKPHOS 40  BILITOT 0.5  PROT 7.2  ALBUMIN 3.8    Lab 10/17/12 1641  LIPASE 38  AMYLASE --   No results found for this basename: AMMONIA:5 in the last 168 hours CBC:  Lab 10/18/12 0654 10/17/12 0700  WBC 2.8* 4.6  NEUTROABS -- 2.6  HGB 15.9 15.6  HCT 45.1 43.7  MCV 98.3 98.0  PLT 192 204   Cardiac Enzymes:  Lab 10/18/12 0654 10/18/12 0005 10/17/12 1805 10/17/12 0725  CKTOTAL -- -- -- --  CKMB -- -- -- --  CKMBINDEX -- -- -- --  TROPONINI <0.30 <0.30 <0.30 <0.30   BNP: BNP (last 3 results)  Basename 10/17/12 0725  PROBNP <5.0   CBG: No results found for this basename: GLUCAP:5 in the last 168 hours     Signed:  MEMON,JEHANZEB  [facility] 10/19/2012, 4:52 PM

## 2012-10-28 ENCOUNTER — Other Ambulatory Visit: Payer: Self-pay

## 2012-10-28 ENCOUNTER — Emergency Department (HOSPITAL_COMMUNITY): Payer: Worker's Compensation

## 2012-10-28 ENCOUNTER — Encounter (HOSPITAL_COMMUNITY): Payer: Self-pay | Admitting: Emergency Medicine

## 2012-10-28 ENCOUNTER — Emergency Department (HOSPITAL_COMMUNITY)
Admission: EM | Admit: 2012-10-28 | Discharge: 2012-10-28 | Disposition: A | Discharge status: Home / Self Care | Payer: Worker's Compensation | Attending: Emergency Medicine | Admitting: Emergency Medicine

## 2012-10-28 DIAGNOSIS — Y9389 Activity, other specified: Secondary | ICD-10-CM | POA: Insufficient documentation

## 2012-10-28 DIAGNOSIS — R51 Headache: Secondary | ICD-10-CM

## 2012-10-28 DIAGNOSIS — R0789 Other chest pain: Secondary | ICD-10-CM | POA: Insufficient documentation

## 2012-10-28 DIAGNOSIS — I10 Essential (primary) hypertension: Secondary | ICD-10-CM | POA: Insufficient documentation

## 2012-10-28 DIAGNOSIS — S79919A Unspecified injury of unspecified hip, initial encounter: Secondary | ICD-10-CM | POA: Insufficient documentation

## 2012-10-28 DIAGNOSIS — M25552 Pain in left hip: Secondary | ICD-10-CM

## 2012-10-28 DIAGNOSIS — W010XXA Fall on same level from slipping, tripping and stumbling without subsequent striking against object, initial encounter: Secondary | ICD-10-CM | POA: Insufficient documentation

## 2012-10-28 DIAGNOSIS — S060X9A Concussion with loss of consciousness of unspecified duration, initial encounter: Secondary | ICD-10-CM | POA: Insufficient documentation

## 2012-10-28 DIAGNOSIS — Z21 Asymptomatic human immunodeficiency virus [HIV] infection status: Secondary | ICD-10-CM | POA: Insufficient documentation

## 2012-10-28 DIAGNOSIS — Y9229 Other specified public building as the place of occurrence of the external cause: Secondary | ICD-10-CM | POA: Insufficient documentation

## 2012-10-28 LAB — CBC
HCT: 43 % (ref 39.0–52.0)
Hemoglobin: 15.2 g/dL (ref 13.0–17.0)
MCV: 97.9 fL (ref 78.0–100.0)
RDW: 12.4 % (ref 11.5–15.5)
WBC: 4.3 10*3/uL (ref 4.0–10.5)

## 2012-10-28 LAB — BASIC METABOLIC PANEL
BUN: 13 mg/dL (ref 6–23)
CO2: 29 mEq/L (ref 19–32)
Chloride: 104 mEq/L (ref 96–112)
Creatinine, Ser: 1.11 mg/dL (ref 0.50–1.35)
GFR calc Af Amer: 90 mL/min — ABNORMAL LOW (ref >90)
Glucose, Bld: 93 mg/dL (ref 70–99)
Potassium: 3.2 mEq/L — ABNORMAL LOW (ref 3.5–5.1)

## 2012-10-28 LAB — TROPONIN I: Troponin I: <0.3 ng/mL (ref <0.30)

## 2012-10-28 MED ORDER — KETOROLAC TROMETHAMINE 30 MG/ML IJ SOLN
15.0000 mg | Freq: Once | INTRAMUSCULAR | Status: AC
  Administered 2012-10-28: 15 mg via INTRAVENOUS
  Filled 2012-10-28: qty 1

## 2012-10-28 MED ORDER — HYDROCODONE-ACETAMINOPHEN 5-325 MG PO TABS: 1.0000 | ORAL_TABLET | Freq: Four times a day (QID) | ORAL | Status: DC | PRN

## 2012-10-28 MED ORDER — HYDROMORPHONE HCL PF 1 MG/ML IJ SOLN
0.5000 mg | Freq: Once | INTRAMUSCULAR | Status: AC
  Administered 2012-10-28: 0.5 mg via INTRAVENOUS
  Filled 2012-10-28: qty 1

## 2012-10-28 MED ORDER — ACETAMINOPHEN 325 MG PO TABS
650.0000 mg | ORAL_TABLET | Freq: Once | ORAL | Status: AC
  Administered 2012-10-28: 650 mg via ORAL
  Filled 2012-10-28: qty 2

## 2012-10-28 MED ORDER — SODIUM CHLORIDE 0.9 % IV BOLUS (SEPSIS)
1000.0000 mL | Freq: Once | INTRAVENOUS | Status: AC
  Administered 2012-10-28: 1000 mL via INTRAVENOUS

## 2012-10-28 MED ORDER — IBUPROFEN 600 MG PO TABS: 600.0000 mg | ORAL_TABLET | Freq: Four times a day (QID) | ORAL | Status: DC | PRN

## 2012-10-28 NOTE — ED Notes (Signed)
Family at bedside. 

## 2012-10-28 NOTE — ED Notes (Signed)
Pt found unresponsive in the bathroom at work unsure if pt slipped or passed out. The episode is unwitnessed. Pt is on spine board and has slurred speech.

## 2012-10-28 NOTE — ED Notes (Signed)
Patient still c/o chest pain. EDP made aware.

## 2012-10-28 NOTE — ED Provider Notes (Addendum)
History   This chart was scribed for Colin Skene, MD by Colin Alexander, ED Scribe. The patient was seen in room APA07/APA07 and the patient's care was started at 3:02PM.     CSN: [id number]  Arrival date & time 10/28/12  1418   First MD Initiated Contact with Patient 10/28/12 1502      Chief Complaint  Patient presents with  . Loss of Consciousness    (Consider location/radiation/quality/duration/timing/severity/associated sxs/prior treatment) The history is provided by the patient. No language interpreter was used.    Colin Alexander is a 46 y.o. male , brought by EMS, with a hx of hypertension, HIV, and LOC (episode occurred 16 years ago), who presents to the Emergency Department complaining of sudden, severe, loss of consciousness, onset today (10/28/12). The pt reports he was using the restroom, proceeded to wash his hands, said he felt himself go "airborne" "like someone swept his legs out with a broom" and found himself being awoken up by Emergency Medical Personnel. EMS reported rice being on the bathroom floor of the school restroom. Pt denies antecedent dizziness, diaphoresis, nausea, changes in vision, prior to LOC. Pt says he has not eaten since yesterday (10/27/12) at lunchtime because he didn't dizzy and hasn't felt hungry.  The pt denies nausea, light headedness, dizziness, numbness, weakness, tingling, abdominal pain, dysuria, melena chest pain, shortness of breath, tachycardia, and palpitations. Additionally, the pt denies taking any new medications at present.   The pt does not smoke or drink alcohol.      Past Medical History  Diagnosis Date  . Hypertension   . HIV (human immunodeficiency virus infection)     Past Surgical History  Procedure Date  . Circumcision   . Hernia repair     Family History  Problem Relation Age of Onset  . Heart attack Mother     Stent placement  . Heart attack Father     CABG  . Hypertension Mother   . Hypertension Father   .  Heart attack Cousin     In his 30's deceased.    History  Substance Use Topics  . Smoking status: Never Smoker   . Smokeless tobacco: Never Used  . Alcohol Use: No      Review of Systems At least 10pt or greater review of systems completed and are negative except where specified in the HPI.  Allergies  Penicillins  Home Medications   Current Outpatient Rx  Name  Route  Sig  Dispense  Refill  . BENZONATATE 100 MG PO CAPS   Oral   Take 1 capsule (100 mg total) by mouth 3 (three) times daily.   20 capsule   0   . CLOTRIMAZOLE 1 % EX CREA   Topical   Apply 1 application topically 2 (two) times daily as needed. For rash         . EMTRICITABINE-TENOFOVIR 200-300 MG PO TABS   Oral   Take 1 tablet by mouth daily.   30 tablet   5   . GUAIFENESIN ER 600 MG PO TB12   Oral   Take 2 tablets (1,200 mg total) by mouth 2 (two) times daily.   14 tablet   0   . LISINOPRIL-HYDROCHLOROTHIAZIDE 20-25 MG PO TABS   Oral   Take 1 tablet by mouth daily.   30 tablet   prn   . PANTOPRAZOLE SODIUM 40 MG PO TBEC   Oral   Take 1 tablet (40 mg total) by mouth daily.  30 tablet   1   . RALTEGRAVIR POTASSIUM 400 MG PO TABS   Oral   Take 1 tablet (400 mg total) by mouth 2 (two) times daily.   60 tablet   5   . VALACYCLOVIR HCL 1 G PO TABS   Oral   Take 1 tablet (1,000 mg total) by mouth 3 (three) times daily.   60 tablet   1     BP 136/99  Pulse 84  Temp 98.7 F (37.1 C) (Oral)  Resp 18  Ht 5\' 8"  (1.727 m)  Wt 180 lb (81.647 kg)  BMI 27.37 kg/m2  SpO2 94%  Physical Exam   PHYSICAL EXAM: VITAL SIGNS:  . Filed Vitals:   10/28/12 1435 10/28/12 1705 10/28/12 1706 10/28/12 1708  BP: 136/99 120/76 135/99 122/95  Pulse: 84 69 71 85  Temp: 98.7 F (37.1 C)     TempSrc: Oral     Resp: 18     Height: 5\' 8"  (1.727 m)     Weight: 180 lb (81.647 kg)     SpO2: 94%      CONSTITUTIONAL: Awake, oriented x4, appears non-toxic. Dramatic.  HENT: Atraumatic,  normocephalic, oral mucosa pink and moist, airway patent. Nares patent without drainage. External ears normal. EYES: Conjunctiva clear, EOMI, PERRLA NECK: Trachea midline, some midline tenderness about C4, supple CARDIOVASCULAR: Normal heart rate, Normal rhythm, No murmurs, rubs, gallops PULMONARY/CHEST: Clear to auscultation, no rhonchi, wheezes, or rales. Symmetrical breath sounds. CHEST WALL: No lesions. Non-tender. ABDOMINAL: Non-distended, soft, non-tender - no rebound or guarding.  BS normal.  NEUROLOGIC: NW:GNFAOZ fields intact. PERRLA, EOMI.  Facial sensation equal to light touch bilaterally.  Good muscle bulk in the masseter muscle and good lateral movement of the jaw.  Facial expressions equal and good strength with smile/frown and puffed cheeks.  Hearing grossly intact to finger rub test.  Uvula, tongue are midline with no deviation. Symmetrical palate elevation.  Trapezius and SCM muscles are 5/5 strength bilaterally.   DTR: Brachioradialis, biceps, patellar, Achilles tendon reflexes 2+ bilaterally.  No clonus. Strength: 5/5 strength flexors and extensors in the upper and lower extremities.  Grip strength, finger adduction/abduction 5/5. Sensation: Sensation intact distally to light touch Cerebellar: No ataxia with walking or dysmetria with finger to nose, rapid alternating hand movements and heels to shin testing. Patient is amnestic after the event.  EXTREMITIES: No clubbing, cyanosis, or edema. Mild tenderness to palpation over the left greater trochanter SKIN: Warm, Dry, No erythema, No rash   ED Course  Procedures (including critical care time)  Date: 10/28/2012  Rate: 89  Rhythm: normal sinus rhythm  QRS Axis: normal  Intervals: normal  ST/T Wave abnormalities: Diffuse nonsignificant J-point elevation most consistent with benign early repolarization  Conduction Disutrbances: none  Narrative Interpretation: unremarkable, nonischemic. Patient's EKG is unchanged from  10/18/2012  DIAGNOSTIC STUDIES: Oxygen Saturation is 94% on room air, normal by my interpretation.    COORDINATION OF CARE:  3:18 PM- Treatment plan discussed with patient. Pt agrees with treatment.    Results for orders placed during the hospital encounter of 10/28/12  CBC      Component Value Range   WBC 4.3  4.0 - 10.5 K/uL   RBC 4.39  4.22 - 5.81 MIL/uL   Hemoglobin 15.2  13.0 - 17.0 g/dL   HCT 30.8  65.7 - 84.6 %   MCV 97.9  78.0 - 100.0 fL   MCH 34.6 (*) 26.0 - 34.0 pg   MCHC 35.3  30.0 - 36.0 g/dL   RDW 16.1  09.6 - 04.5 %   Platelets 236  150 - 400 K/uL  BASIC METABOLIC PANEL      Component Value Range   Sodium 141  135 - 145 mEq/L   Potassium 3.2 (*) 3.5 - 5.1 mEq/L   Chloride 104  96 - 112 mEq/L   CO2 29  19 - 32 mEq/L   Glucose, Bld 93  70 - 99 mg/dL   BUN 13  6 - 23 mg/dL   Creatinine, Ser 4.09  0.50 - 1.35 mg/dL   Calcium 9.4  8.4 - 81.1 mg/dL   GFR calc non Af Amer 77 (*) >90 mL/min   GFR calc Af Amer 90 (*) >90 mL/min  TROPONIN I      Component Value Range   Troponin I <0.30  <0.30 ng/mL  GLUCOSE, CAPILLARY      Component Value Range   Glucose-Capillary 91  70 - 99 mg/dL   Dg Hip Complete Left  10/28/2012  *RADIOLOGY REPORT*  Clinical Data: Larey Seat on floor.  Left hip and buttock pain.  LEFT HIP - COMPLETE 2+ VIEW  Comparison: None.  Findings: Bony mineralization is normal.  The pelvic ring is intact.  No acute fracture of the pelvic ring is identified.  The left femoral head is located.  No acute fracture of the proximal left femur is seen.  Note is made of congenitally short femoral necks bilaterally.  The lateral portions of both femoral heads are uncovered by the acetabulae.  No significant joint space narrowing of either hip.  IMPRESSION:  1.  No acute bony abnormality. 2.  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  No significant degenerative changes of either hip.   Original Report Authenticated By: Britta Mccreedy, M.D.    Ct Head Wo  Contrast  10/28/2012  *RADIOLOGY REPORT*  Clinical Data:  Slipped and fell in bathroom, head and neck pain, history hypertension, HIV  CT HEAD WITHOUT CONTRAST CT CERVICAL SPINE WITHOUT CONTRAST  Technique:  Multidetector CT imaging of the head and cervical spine was performed following the standard protocol without intravenous contrast.  Multiplanar CT image reconstructions of the cervical spine were also generated.  Comparison:  None  CT HEAD  Findings: Normal ventricular morphology. No midline shift or mass effect. Scattered dural calcifications. No intracranial hemorrhage, mass lesion, or evidence of acute infarction. No extra-axial fluid collections. Large mucosal retention cyst right maxillary sinus. Bones and sinuses otherwise unremarkable.  IMPRESSION: No acute intracranial abnormalities.  CT CERVICAL SPINE  Findings: Anterior cervical soft tissues unremarkable. Streak artifacts at skull base. Visualized skull base intact. Osseous mineralization normal. Prevertebral soft tissues normal thickness. Vertebral body and disc space heights maintained. No acute fracture, subluxation or bone destruction. Mild scattered facet degenerative changes. Lung apices clear.  IMPRESSION: No acute cervical spine abnormalities. Scattered facet degenerative changes of the cervical spine.   Original Report Authenticated By: Ulyses Southward, M.D.     Ct Cervical Spine Wo Contrast  10/28/2012  *RADIOLOGY REPORT*  Clinical Data:  Slipped and fell in bathroom, head and neck pain, history hypertension, HIV  CT HEAD WITHOUT CONTRAST CT CERVICAL SPINE WITHOUT CONTRAST  Technique:  Multidetector CT imaging of the head and cervical spine was performed following the standard protocol without intravenous contrast.  Multiplanar CT image reconstructions of the cervical spine were also generated.  Comparison:  None  CT HEAD  Findings: Normal ventricular morphology. No midline shift or mass effect. Scattered dural  calcifications. No  intracranial hemorrhage, mass lesion, or evidence of acute infarction. No extra-axial fluid collections. Large mucosal retention cyst right maxillary sinus. Bones and sinuses otherwise unremarkable.  IMPRESSION: No acute intracranial abnormalities.  CT CERVICAL SPINE  Findings: Anterior cervical soft tissues unremarkable. Streak artifacts at skull base. Visualized skull base intact. Osseous mineralization normal. Prevertebral soft tissues normal thickness. Vertebral body and disc space heights maintained. No acute fracture, subluxation or bone destruction. Mild scattered facet degenerative changes. Lung apices clear.  IMPRESSION: No acute cervical spine abnormalities. Scattered facet degenerative changes of the cervical spine.   Original Report Authenticated By: Ulyses Southward, M.D.      1. Fall from slipping on slippery surface   2. Headache   3. Atypical chest pain   4. Left hip pain   5. Concussion       MDM  Colin Alexander is a 46 y.o. male history of hypertension and HIV presenting with fall, loss of consciousness. Patient denies any prodromal syncopal symptoms, however with rice being found on the bathroom floor and the patient feeling like his legs were swept out from under him, it makes sense that this is a mechanical fall whereby he lost consciousness when he hit the ground and has some event amnesia. Head is atraumatic- does have some neck pain in the midline. Patient was hospitalized recently with concern for ACS. Patient was worked up including a exercise stress test with nuclear medicine Myoview. Patient also had a cough during that timeframe with a runny nose - and said the pain was pleuritic and worse when he coughed. Patient had a CT angiogram at that time as well and also had no PE at that time.  After patient's EKG was obtained, patient began complaining about chest pain.  Labs are unremarkable, imaging of the patient's head and neck are unremarkable.  Patient is not on blood  thinners. 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 No significant degenerative changes of either hip."  Patient reassessed after second EKG troponin and these were both normal, patient feeling much better. Patient is asking to eat and to home. 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 Head injury precautions given.  I explained the diagnosis and have given explicit precautions to return to the ER including any other new or worsening symptoms. The patient understands and accepts the medical plan as it's been dictated and I have answered their questions. Discharge instructions concerning home care and prescriptions have been given.  The patient is STABLE and is discharged to home in good condition.     I personally performed the services described in this documentation, which was scribed in my presence. The recorded information has been reviewed and is accurate. Colin Alexander, M.D.   Colin Skene, MD 10/28/12 1811

## 2012-11-11 ENCOUNTER — Ambulatory Visit (INDEPENDENT_AMBULATORY_CARE_PROVIDER_SITE_OTHER): Payer: Self-pay | Admitting: Internal Medicine

## 2012-11-11 ENCOUNTER — Encounter: Payer: Self-pay | Admitting: Internal Medicine

## 2012-11-11 VITALS — BP 136/99 | HR 83 | Temp 98.2°F | Wt 184.0 lb

## 2012-11-11 DIAGNOSIS — IMO0001 Reserved for inherently not codable concepts without codable children: Secondary | ICD-10-CM

## 2012-11-11 DIAGNOSIS — M7918 Myalgia, other site: Secondary | ICD-10-CM

## 2012-11-11 MED ORDER — NAPROXEN 500 MG PO TABS: 500.0000 mg | ORAL_TABLET | Freq: Two times a day (BID) | ORAL | Status: DC

## 2012-11-11 MED ORDER — DIAZEPAM 5 MG PO TABS: 5.0000 mg | ORAL_TABLET | Freq: Every evening | ORAL | Status: DC | PRN

## 2012-11-11 NOTE — Progress Notes (Signed)
HIV CLINIC APPT  RFV: routine visit Subjective:    [name], male    DOB: [birth year], 47 y.o.   MRN: [id number]  HPI 46yo M with HIV, CD140/ VL 100,(Nov 2013) on truvada and raltegravir. Ground level fall with loss of consciousness (slippery floor in bathroom) in December with residual chest pain. Has been admitted in early December, and also had ED visit on 12/19 to rule out ACS. Mostly reproducable pain on left chest wall. He has had tylenol, ibuprofen, and muscle relaxant without relief.  Current Outpatient Prescriptions on File Prior to Visit  Medication Sig Dispense Refill  . benzonatate (TESSALON) 100 MG capsule Take 1 capsule (100 mg total) by mouth 3 (three) times daily.  20 capsule  0  . clotrimazole (LOTRIMIN) 1 % cream Apply 1 application topically 2 (two) times daily as needed. For rash      . emtricitabine-tenofovir (TRUVADA) 200-300 MG per tablet Take 1 tablet by mouth daily.  30 tablet  5  . guaiFENesin (MUCINEX) 600 MG 12 hr tablet Take 2 tablets (1,200 mg total) by mouth 2 (two) times daily.  14 tablet  0  . HYDROcodone-acetaminophen (NORCO/VICODIN) 5-325 MG per tablet Take 1-2 tablets by mouth every 6 (six) hours as needed for pain.  7 tablet  0  . ibuprofen (ADVIL,MOTRIN) 600 MG tablet Take 1 tablet (600 mg total) by mouth every 6 (six) hours as needed for pain.  30 tablet  0  . lisinopril-hydrochlorothiazide (PRINZIDE,ZESTORETIC) 20-25 MG per tablet Take 1 tablet by mouth daily.  30 tablet  prn  . pantoprazole (PROTONIX) 40 MG tablet Take 1 tablet (40 mg total) by mouth daily.  30 tablet  1  . raltegravir (ISENTRESS) 400 MG tablet Take 1 tablet (400 mg total) by mouth 2 (two) times daily.  60 tablet  5  . valACYclovir (VALTREX) 1000 MG tablet Take 1 tablet (1,000 mg total) by mouth 3 (three) times daily.  60 tablet  1   Active Ambulatory Problems    Diagnosis Date Noted  . HTN (hypertension) 12/31/2011  . HIV (human immunodeficiency virus infection)  01/13/2012  . HSV (herpes simplex virus) anogenital infection 06/07/2012  . Chest pain 10/17/2012  . Atypical chest pain 10/18/2012  . Family history of premature CAD 10/18/2012  . Bronchitis 10/19/2012   Resolved Ambulatory Problems    Diagnosis Date Noted  . No Resolved Ambulatory Problems   Past Medical History  Diagnosis Date  . Hypertension       Review of Systems 10 point ROS    Objective:   Physical Exam BP 136/99  Pulse 83  Temp 98.2 F (36.8 C) (Oral)  Wt 184 lb (83.462 kg) Physical Exam  Constitutional: He is oriented to person, place, and time. He appears well-developed and well-nourished. No distress.  HENT:  Mouth/Throat: Oropharynx is clear and moist. No oropharyngeal exudate.  Cardiovascular: Normal rate, regular rhythm and normal heart sounds. Exam reveals no gallop and no friction rub.  No murmur heard.  Pulmonary/Chest: Effort normal and breath sounds normal. No respiratory distress. He has no wheezes.  Chest wall: tender to palpation on lower ribs anterior axillary line Abdominal: Soft. Bowel sounds are normal. He exhibits no distension. There is no tenderness.  Lymphadenopathy:  He has no cervical adenopathy.  Neurological: He is alert and oriented to person, place, and time.  Skin: Skin is warm and dry. No rash noted. No erythema.  Psychiatric: He has a normal mood and affect. His  behavior is normal.   Last cxr: no rib fractures on read    Assessment & Plan:  HIV = continue with truvada and raltegravir.  OI prophylaxis =bactrim DS daily  HSV anogenital rash = valtrex 1gm BID x 10 days.  Musculoskeletal pain = try a trial of naprosyn 500mg  BID and valium 5mg  as needed ( #21, no refill)  rtc 72month

## 2012-12-06 ENCOUNTER — Other Ambulatory Visit: Payer: Self-pay | Admitting: *Deleted

## 2012-12-06 DIAGNOSIS — B20 Human immunodeficiency virus [HIV] disease: Secondary | ICD-10-CM

## 2012-12-06 MED ORDER — VALACYCLOVIR HCL 1 G PO TABS: 1000.0000 mg | ORAL_TABLET | Freq: Three times a day (TID) | ORAL | Status: DC

## 2012-12-09 ENCOUNTER — Ambulatory Visit (INDEPENDENT_AMBULATORY_CARE_PROVIDER_SITE_OTHER): Payer: Self-pay | Admitting: Internal Medicine

## 2012-12-09 VITALS — Ht 68.0 in | Wt 188.0 lb

## 2012-12-09 DIAGNOSIS — B20 Human immunodeficiency virus [HIV] disease: Secondary | ICD-10-CM

## 2012-12-09 DIAGNOSIS — Z21 Asymptomatic human immunodeficiency virus [HIV] infection status: Secondary | ICD-10-CM

## 2012-12-09 MED ORDER — VALACYCLOVIR HCL 1 G PO TABS: 1000.0000 mg | ORAL_TABLET | Freq: Every day | ORAL | Status: DC

## 2012-12-09 MED ORDER — SULFAMETHOXAZOLE-TMP DS 800-160 MG PO TABS: 1.0000 | ORAL_TABLET | Freq: Every day | ORAL | Status: DC

## 2012-12-09 MED ORDER — DOLUTEGRAVIR SODIUM 50 MG PO TABS: 1.0000 | ORAL_TABLET | Freq: Every day | ORAL | Status: DC

## 2012-12-09 NOTE — Progress Notes (Signed)
[facility] CLIN[facility] NOTE  RFV: routine visit Subjective:    [name], male    DOB: 02-19-1966, 47 y.o.   MRN: [id number]  HPI 47 yo Male with HIV, CD 4 count 140/VL 100, on truvada/raltegravir. Having difficulty with taking meds BID. He has some confusion with his valtrex and truvada.  He reports that his herpes outbreak is under control. No new lesions.  Current Outpatient Prescriptions on File Prior to Visit  Medication Sig Dispense Refill  . clotrimazole (LOTRIMIN) 1 % cream Apply 1 application topically 2 (two) times daily as needed. For rash      . emtricitabine-tenofovir (TRUVADA) 200-300 MG per tablet Take 1 tablet by mouth daily.  30 tablet  5  . lisinopril-hydrochlorothiazide (PRINZIDE,ZESTORETIC) 20-25 MG per tablet Take 1 tablet by mouth daily.  30 tablet  prn  . pantoprazole (PROTONIX) 40 MG tablet Take 1 tablet (40 mg total) by mouth daily.  30 tablet  1  . raltegravir (ISENTRESS) 400 MG tablet Take 1 tablet (400 mg total) by mouth 2 (two) times daily.  60 tablet  5  . valACYclovir (VALTREX) 1000 MG tablet Take 1 tablet (1,000 mg total) by mouth 3 (three) times daily.  60 tablet  1  . benzonatate (TESSALON) 100 MG capsule Take 1 capsule (100 mg total) by mouth 3 (three) times daily.  20 capsule  0  . diazepam (VALIUM) 5 MG tablet Take 1 tablet (5 mg total) by mouth at bedtime as needed for anxiety.  30 tablet  0  . guaiFENesin (MUCINEX) 600 MG 12 hr tablet Take 2 tablets (1,200 mg total) by mouth 2 (two) times daily.  14 tablet  0  . HYDROcodone-acetaminophen (NORCO/VICODIN) 5-325 MG per tablet Take 1-2 tablets by mouth every 6 (six) hours as needed for pain.  7 tablet  0  . ibuprofen (ADVIL,MOTRIN) 600 MG tablet Take 1 tablet (600 mg total) by mouth every 6 (six) hours as needed for pain.  30 tablet  0  . naproxen (NAPROSYN) 500 MG tablet Take 1 tablet (500 mg total) by mouth 2 (two) times daily with a meal.  14 tablet  0     Review of Systems Review of Systems   Constitutional: Negative for fever, chills, diaphoresis, activity change, appetite change, fatigue and unexpected weight change.  HENT: Negative for congestion, sore throat, rhinorrhea, sneezing, trouble swallowing and sinus pressure.  Eyes: Negative for photophobia and visual disturbance.  Respiratory: Negative for cough, chest tightness, shortness of breath, wheezing and stridor.  Cardiovascular: Negative for chest pain, palpitations and leg swelling.  Gastrointestinal: Negative for nausea, vomiting, abdominal pain, diarrhea, constipation, blood in stool, abdominal distention and anal bleeding.  Genitourinary: Negative for dysuria, hematuria, flank pain and difficulty urinating.  Musculoskeletal: Negative for myalgias, back pain, joint swelling, arthralgias and gait problem.  Skin: Negative for color change, pallor, rash and wound.  Neurological: Negative for dizziness, tremors, weakness and light-headedness.  Hematological: Negative for adenopathy. Does not bruise/bleed easily.  Psychiatric/Behavioral: Negative for behavioral problems, confusion, sleep disturbance, dysphoric mood, decreased concentration and agitation.       Objective:   Physical Exam Ht 5\' 8"  (1.727 m)  Wt 188 lb (85.276 kg)  BMI 28.59 kg/m2 Physical Exam  Constitutional: He is oriented to person, place, and time. He appears well-developed and well-nourished. No distress.  HENT:  Mouth/Throat: Oropharynx is clear and moist. No oropharyngeal exudate.  Cardiovascular: Normal rate, regular rhythm and normal heart sounds. Exam reveals no gallop  and no friction rub.  No murmur heard.  Pulmonary/Chest: Effort normal and breath sounds normal. No respiratory distress. He has no wheezes.  Abdominal: Soft. Bowel sounds are normal. He exhibits no distension. There is no tenderness.  Lymphadenopathy:  He has no cervical adenopathy.  Neurological: He is alert and oriented to person, place, and time.  Skin: Skin is warm and  dry. No rash noted. No erythema.  Psychiatric: He has a normal mood and affect. His behavior is normal.      Assessment & Plan:  hiv = reinforced to keep pillbox to help differentiate the pills he needs to take. May need to change isentriss to tivicay if still having difficulty with BID dosing. Will readdress at next visit.  hsv proph= consider doing a trial of empiric treatment when needed instead of chronic suppression  hiv prevention = continue to use condoms as needed for protection

## 2013-01-06 ENCOUNTER — Other Ambulatory Visit: Payer: Self-pay | Admitting: Internal Medicine

## 2013-01-06 ENCOUNTER — Other Ambulatory Visit: Payer: Worker's Compensation

## 2013-01-06 DIAGNOSIS — Z113 Encounter for screening for infections with a predominantly sexual mode of transmission: Secondary | ICD-10-CM

## 2013-01-06 DIAGNOSIS — B20 Human immunodeficiency virus [HIV] disease: Secondary | ICD-10-CM

## 2013-01-06 LAB — LIPID PANEL
Cholesterol: 128 mg/dL (ref 0–200)
HDL: 37 mg/dL — ABNORMAL LOW (ref >39)
Total CHOL/HDL Ratio: 3.5 Ratio
Triglycerides: 75 mg/dL (ref <150)
VLDL: 15 mg/dL (ref 0–40)

## 2013-01-06 LAB — CBC WITH DIFFERENTIAL/PLATELET
Basophils Absolute: 0 10*3/uL (ref 0.0–0.1)
Eosinophils Relative: 8 % — ABNORMAL HIGH (ref 0–5)
Lymphocytes Relative: 27 % (ref 12–46)
MCV: 97.6 fL (ref 78.0–100.0)
Neutro Abs: 2 10*3/uL (ref 1.7–7.7)
Platelets: 232 10*3/uL (ref 150–400)
RDW: 13 % (ref 11.5–15.5)
WBC: 3.5 10*3/uL — ABNORMAL LOW (ref 4.0–10.5)

## 2013-01-06 LAB — COMPREHENSIVE METABOLIC PANEL
ALT: 19 U/L (ref 0–53)
AST: 18 U/L (ref 0–37)
Alkaline Phosphatase: 41 U/L (ref 39–117)
Calcium: 9.6 mg/dL (ref 8.4–10.5)
Chloride: 101 mEq/L (ref 96–112)
Creat: 1.02 mg/dL (ref 0.50–1.35)

## 2013-01-07 LAB — T-HELPER CELL (CD4) - (RCID CLINIC ONLY): CD4 % Helper T Cell: 17 % — ABNORMAL LOW (ref 33–55)

## 2013-01-20 ENCOUNTER — Ambulatory Visit: Payer: Self-pay | Admitting: Internal Medicine

## 2013-01-25 ENCOUNTER — Emergency Department (HOSPITAL_COMMUNITY): Payer: Self-pay

## 2013-01-25 ENCOUNTER — Emergency Department (HOSPITAL_COMMUNITY)
Admission: EM | Admit: 2013-01-25 | Discharge: 2013-01-25 | Disposition: A | Discharge status: Home / Self Care | Payer: Self-pay | Attending: Emergency Medicine | Admitting: Emergency Medicine

## 2013-01-25 ENCOUNTER — Ambulatory Visit (INDEPENDENT_AMBULATORY_CARE_PROVIDER_SITE_OTHER): Payer: Self-pay | Admitting: Internal Medicine

## 2013-01-25 ENCOUNTER — Encounter: Payer: Self-pay | Admitting: Internal Medicine

## 2013-01-25 ENCOUNTER — Encounter (HOSPITAL_COMMUNITY): Payer: Self-pay | Admitting: Emergency Medicine

## 2013-01-25 VITALS — BP 136/94 | HR 75 | Temp 98.1°F | Wt 185.0 lb

## 2013-01-25 DIAGNOSIS — B20 Human immunodeficiency virus [HIV] disease: Secondary | ICD-10-CM

## 2013-01-25 DIAGNOSIS — S20219A Contusion of unspecified front wall of thorax, initial encounter: Secondary | ICD-10-CM | POA: Insufficient documentation

## 2013-01-25 DIAGNOSIS — S40029A Contusion of unspecified upper arm, initial encounter: Secondary | ICD-10-CM | POA: Insufficient documentation

## 2013-01-25 DIAGNOSIS — I10 Essential (primary) hypertension: Secondary | ICD-10-CM | POA: Insufficient documentation

## 2013-01-25 DIAGNOSIS — Z21 Asymptomatic human immunodeficiency virus [HIV] infection status: Secondary | ICD-10-CM | POA: Insufficient documentation

## 2013-01-25 DIAGNOSIS — Z79899 Other long term (current) drug therapy: Secondary | ICD-10-CM | POA: Insufficient documentation

## 2013-01-25 DIAGNOSIS — Y92410 Unspecified street and highway as the place of occurrence of the external cause: Secondary | ICD-10-CM | POA: Insufficient documentation

## 2013-01-25 DIAGNOSIS — Y939 Activity, unspecified: Secondary | ICD-10-CM | POA: Insufficient documentation

## 2013-01-25 DIAGNOSIS — S20211A Contusion of right front wall of thorax, initial encounter: Secondary | ICD-10-CM

## 2013-01-25 MED ORDER — METHOCARBAMOL 500 MG PO TABS: 1000.0000 mg | ORAL_TABLET | Freq: Four times a day (QID) | ORAL | Status: DC | PRN

## 2013-01-25 MED ORDER — OXYCODONE-ACETAMINOPHEN 5-325 MG PO TABS
2.0000 | ORAL_TABLET | Freq: Once | ORAL | Status: AC
  Administered 2013-01-25: 2 via ORAL
  Filled 2013-01-25: qty 2

## 2013-01-25 MED ORDER — HYDROCODONE-ACETAMINOPHEN 5-325 MG PO TABS: ORAL_TABLET | ORAL | Status: DC
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
## 2013-01-25 NOTE — Progress Notes (Signed)
[facility] NOTE  RFV: routine visit, 1 month after switch Subjective:    [name], male    DOB: 1966-08-20, 47 y.o.   MRN: [id number]  HPI 47 yo HIV, CD 4 count 160 (17%)/VL <20, switched to tivicay/truvada, continues bactrim for OI proph. Missed 2 doses in the last month.   Taking care of his dad lately since he has had numerous TIAs, followed at [facility].   Current Outpatient Prescriptions on File Prior to Visit  Medication Sig Dispense Refill  . clotrimazole (LOTRIMIN) 1 % cream Apply 1 application topically 2 (two) times daily as needed. For rash      . Dolutegravir Sodium (TIVICAY) 50 MG TABS Take 1 tablet (50 mg total) by mouth daily.  30 tablet  11  . emtricitabine-tenofovir (TRUVADA) 200-300 MG per tablet Take 1 tablet by mouth daily.  30 tablet  5  . lisinopril-hydrochlorothiazide (PRINZIDE,ZESTORETIC) 20-25 MG per tablet Take 1 tablet by mouth daily.  30 tablet  prn  . sulfamethoxazole-trimethoprim (BACTRIM DS) 800-160 MG per tablet Take 1 tablet by mouth daily.  30 tablet  11  . valACYclovir (VALTREX) 1000 MG tablet Take 1 tablet (1,000 mg total) by mouth daily.  30 tablet  11   No current facility-administered medications on file prior to visit.   Active Ambulatory Problems    Diagnosis Date Noted  . HTN (hypertension) 12/31/2011  . HIV (human immunodeficiency virus infection) 01/13/2012  . HSV (herpes simplex virus) anogenital infection 06/07/2012  . Chest pain 10/17/2012  . Atypical chest pain 10/18/2012  . Family history of premature CAD 10/18/2012  . Bronchitis 10/19/2012   Resolved Ambulatory Problems    Diagnosis Date Noted  . No Resolved Ambulatory Problems   Past Medical History  Diagnosis Date  . Hypertension       Review of Systems     Objective:   Physical Exam BP 136/94  Pulse 75  Temp(Src) 98.1 F (36.7 C) (Oral)  Wt 185 lb (83.915 kg)  BMI 28.14 kg/m2 Physical Exam  Constitutional: He is oriented to person, place, and  time. He appears well-developed and well-nourished. No distress.  HENT:  Mouth/Throat: Oropharynx is clear and moist. No oropharyngeal exudate.  Cardiovascular: Normal rate, regular rhythm and normal heart sounds. Exam reveals no gallop and no friction rub.  No murmur heard.  Pulmonary/Chest: Effort normal and breath sounds normal. No respiratory distress. He has no wheezes.  Abdominal: Soft. Bowel sounds are normal. He exhibits no distension. There is no tenderness.  Lymphadenopathy:  He has no cervical adenopathy.  Skin: Skin is warm and dry. No rash noted. No erythema.         Assessment & Plan:  HIV = continue tivicay and truvada.   oi proph =  Continue bactrim   Health maintenance = hep A vacc #1 today, and next in sept
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
## 2013-01-31 NOTE — Progress Notes (Signed)
HPI: Colin Alexander is a 47 y.o. male with HIV who is here for follow up.   Allergies: Allergies  Allergen Reactions  . Penicillins Shortness Of Breath and Rash    Vitals:    Past Medical History: Past Medical History  Diagnosis Date  . Hypertension   . HIV (human immunodeficiency virus infection)     Social History: History   Social History  . Marital Status: Divorced    Spouse Name: N/A    Number of Children: N/A  . Years of Education: N/A   Social History Main Topics  . Smoking status: Never Smoker   . Smokeless tobacco: Never Used  . Alcohol Use: No  . Drug Use: No  . Sexually Active: Yes    Birth Control/ Protection: Condom   Other Topics Concern  . None   Social History Narrative  . None    Current Regimen: DLG + Truvada  Labs: HIV 1 RNA Quant (copies/mL)  Date Value  01/06/2013 <20   09/30/2012 100*  06/21/2012 29*     CD4 T Cell Abs (cmm)  Date Value  01/06/2013 160*  09/30/2012 140*  06/21/2012 120*     Hep B S Ab (no units)  Date Value  12/30/2011 POS*     Hepatitis B Surface Ag (no units)  Date Value  12/30/2011 NEGATIVE      HCV Ab (no units)  Date Value  12/30/2011 NEGATIVE     CrCl: The CrCl is unknown because both a height and weight (above a minimum accepted value) are required for this calculation.  Lipids:    Component Value Date/Time   CHOL 128 01/06/2013 1118   TRIG 75 01/06/2013 1118   HDL 37* 01/06/2013 1118   CHOLHDL 3.5 01/06/2013 1118   VLDL 15 01/06/2013 1118   LDLCALC 76 01/06/2013 1118    Assessment: 47 yo who is very well controlled with the current regimen. His CD4 cont to rise and VL is undetectable. I counseled him to try not to miss any doses and cont to take his septra for PCP prophylaxis.   Recommendations: Cont DLG + Truvada Cont septra for PCP prophylaxis  Clide Cliff, PharmD Clinical Infectious Disease Pharmacist [facility] 01/31/2013, 11:28 PM

## 2013-02-01 ENCOUNTER — Ambulatory Visit: Payer: Self-pay

## 2013-02-08 ENCOUNTER — Ambulatory Visit: Payer: Self-pay

## 2013-02-18 ENCOUNTER — Encounter: Payer: Self-pay | Admitting: Licensed Clinical Social Worker

## 2013-03-09 ENCOUNTER — Telehealth: Payer: Self-pay | Admitting: *Deleted

## 2013-03-09 NOTE — Telephone Encounter (Signed)
Called the patient and advised him that the forms he dropped off are done and that he needs to come an pick them up because he has a couple of areas he needs to fill in and sign prior to mailing them to [place]. He advised he will be in next week to pick them up as he is on his way out of town.

## 2013-03-24 ENCOUNTER — Other Ambulatory Visit: Payer: Self-pay | Admitting: *Deleted

## 2013-03-24 DIAGNOSIS — B20 Human immunodeficiency virus [HIV] disease: Secondary | ICD-10-CM

## 2013-03-24 MED ORDER — EMTRICITABINE-TENOFOVIR DF 200-300 MG PO TABS: 1.0000 | ORAL_TABLET | Freq: Every day | ORAL | Status: DC

## 2013-04-14 ENCOUNTER — Other Ambulatory Visit: Payer: Self-pay

## 2013-04-14 DIAGNOSIS — B20 Human immunodeficiency virus [HIV] disease: Secondary | ICD-10-CM

## 2013-04-14 LAB — COMPREHENSIVE METABOLIC PANEL
CO2: 28 mEq/L (ref 19–32)
Calcium: 9.7 mg/dL (ref 8.4–10.5)
Chloride: 100 mEq/L (ref 96–112)
Creat: 1.1 mg/dL (ref 0.50–1.35)
Glucose, Bld: 72 mg/dL (ref 70–99)
Total Bilirubin: 0.8 mg/dL (ref 0.3–1.2)
Total Protein: 7.3 g/dL (ref 6.0–8.3)

## 2013-04-14 LAB — CBC WITH DIFFERENTIAL/PLATELET
Basophils Absolute: 0.1 10*3/uL (ref 0.0–0.1)
Basophils Relative: 1 % (ref 0–1)
Eosinophils Relative: 11 % — ABNORMAL HIGH (ref 0–5)
HCT: 44.5 % (ref 39.0–52.0)
MCH: 34.5 pg — ABNORMAL HIGH (ref 26.0–34.0)
MCHC: 35.1 g/dL (ref 30.0–36.0)
MCV: 98.5 fL (ref 78.0–100.0)
Monocytes Absolute: 0.3 10*3/uL (ref 0.1–1.0)
Monocytes Relative: 7 % (ref 3–12)
RDW: 12.3 % (ref 11.5–15.5)

## 2013-04-15 LAB — T-HELPER CELL (CD4) - (RCID CLINIC ONLY)
CD4 % Helper T Cell: 19 % — ABNORMAL LOW (ref 33–55)
CD4 T Cell Abs: 250 uL — ABNORMAL LOW (ref 400–2700)

## 2013-04-28 ENCOUNTER — Other Ambulatory Visit: Payer: Self-pay | Admitting: Licensed Clinical Social Worker

## 2013-04-28 ENCOUNTER — Ambulatory Visit: Payer: Self-pay | Admitting: Internal Medicine

## 2013-04-28 DIAGNOSIS — I10 Essential (primary) hypertension: Secondary | ICD-10-CM

## 2013-04-28 MED ORDER — LISINOPRIL-HYDROCHLOROTHIAZIDE 20-25 MG PO TABS: 1.0000 | ORAL_TABLET | Freq: Every day | ORAL | Status: DC

## 2013-05-17 ENCOUNTER — Encounter: Payer: Self-pay | Admitting: Internal Medicine

## 2013-05-17 ENCOUNTER — Ambulatory Visit (INDEPENDENT_AMBULATORY_CARE_PROVIDER_SITE_OTHER): Payer: Self-pay | Admitting: Internal Medicine

## 2013-05-17 VITALS — BP 138/99 | HR 75 | Temp 98.1°F | Wt 183.0 lb

## 2013-05-17 DIAGNOSIS — B20 Human immunodeficiency virus [HIV] disease: Secondary | ICD-10-CM

## 2013-05-17 DIAGNOSIS — I10 Essential (primary) hypertension: Secondary | ICD-10-CM

## 2013-05-17 MED ORDER — LISINOPRIL-HYDROCHLOROTHIAZIDE 20-25 MG PO TABS: 1.0000 | ORAL_TABLET | Freq: Every day | ORAL | Status: DC

## 2013-05-17 NOTE — Progress Notes (Signed)
[facility] NOTE  RFV: routine Subjective:    [name], male    DOB: [birth year], 47 y.o.   MRN: [id number]  HPI Nitin is a 47yo M with HIV, CD 4 count of 250(19%)/VL<20, switched to DLG/truvada in Feb 2014, an increase in CD 4 count from 160 to 250 since last blood draw in Feb. He continues on bactrim for pcp proph.He reports missing  1-2 dose in last 30 days.  Working in the yard, having multiple mosquito bites using hydrocortisone cream.  Current Outpatient Prescriptions on File Prior to Visit  Medication Sig Dispense Refill  . Dolutegravir Sodium (TIVICAY) 50 MG TABS Take 1 tablet (50 mg total) by mouth daily.  30 tablet  11  . emtricitabine-tenofovir (TRUVADA) 200-300 MG per tablet Take 1 tablet by mouth daily.  30 tablet  5  . lisinopril-hydrochlorothiazide (PRINZIDE,ZESTORETIC) 20-25 MG per tablet Take 1 tablet by mouth daily.  30 tablet  prn  . methocarbamol (ROBAXIN) 500 MG tablet Take 2 tablets (1,000 mg total) by mouth 4 (four) times daily as needed (muscle spasm/pain).  25 tablet  0  . sulfamethoxazole-trimethoprim (BACTRIM DS) 800-160 MG per tablet Take 1 tablet by mouth daily.  30 tablet  11  . valACYclovir (VALTREX) 1000 MG tablet Take 1 tablet (1,000 mg total) by mouth daily.  30 tablet  11   No current facility-administered medications on file prior to visit.   Active Ambulatory Problems    Diagnosis Date Noted  . HTN (hypertension) 12/31/2011  . HIV (human immunodeficiency virus infection) 01/13/2012  . HSV (herpes simplex virus) anogenital infection 06/07/2012  . Chest pain 10/17/2012  . Atypical chest pain 10/18/2012  . Family history of premature CAD 10/18/2012  . Bronchitis 10/19/2012   Resolved Ambulatory Problems    Diagnosis Date Noted  . No Resolved Ambulatory Problems   Past Medical History  Diagnosis Date  . Hypertension     Review of Systems Review of Systems  Constitutional: Negative for fever, chills, diaphoresis, activity  change, appetite change, fatigue and unexpected weight change.  HENT: Negative for congestion, sore throat, rhinorrhea, sneezing, trouble swallowing and sinus pressure.  Eyes: Negative for photophobia and visual disturbance.  Respiratory: Negative for cough, chest tightness, shortness of breath, wheezing and stridor.  Cardiovascular: Negative for chest pain, palpitations and leg swelling.  Gastrointestinal: Negative for nausea, vomiting, abdominal pain, diarrhea, constipation, blood in stool, abdominal distention and anal bleeding.  Genitourinary: Negative for dysuria, hematuria, flank pain and difficulty urinating.  Musculoskeletal: Negative for myalgias, back pain, joint swelling, arthralgias and gait problem.  Skin: Negative for color change, pallor, rash and wound.  Neurological: Negative for dizziness, tremors, weakness and light-headedness.  Hematological: Negative for adenopathy. Does not bruise/bleed easily.  Psychiatric/Behavioral: Negative for behavioral problems, confusion, sleep disturbance, dysphoric mood, decreased concentration and agitation.       Objective:   Physical Exam BP 138/99  Pulse 75  Temp(Src) 98.1 F (36.7 C) (Oral)  Wt 183 lb (83.008 kg)  BMI 27.83 kg/m2 Physical Exam  Constitutional: He is oriented to person, place, and time. He appears well-developed and well-nourished. No distress.  HENT:  Mouth/Throat: Oropharynx is clear and moist. No oropharyngeal exudate.  Cardiovascular: Normal rate, regular rhythm and normal heart sounds. Exam reveals no gallop and no friction rub.  No murmur heard.  Pulmonary/Chest: Effort normal and breath sounds normal. No respiratory distress. He has no wheezes.  Abdominal: Soft. Bowel sounds are normal. He exhibits no distension. There  is no tenderness.  Lymphadenopathy:  He has no cervical adenopathy.  Neurological: He is alert and oriented to person, place, and time.  Skin: Skin is warm and dry. No rash noted. No  erythema.  Psychiatric: He has a normal mood and affect. His behavior is normal.       Assessment & Plan:  HIV = continue to take tivicay and truvada. Having good immunologic response.  Cognitive impairment = although not overtly noticeable, I suspect he has some element of HAND vs. Non-HIV related causes. He continues to have difficulty with his medication adherence even though we have changed his regimen to once a day dosing.  hsv proph = can continue with valtrex 1gm daily proph  HTN = continue with lisinopril 10/HCTZ 25. Needs a refill  Med adherence = of the 25 min visit, greater than 50% spent on adherence counseling.   OI proph = will continue with bactrim for 3 more months then stop since his CD 4 count is now > 200.   rtc in 3 months

## 2013-05-24 ENCOUNTER — Ambulatory Visit: Payer: Self-pay

## 2013-05-30 ENCOUNTER — Telehealth: Payer: Self-pay | Admitting: *Deleted

## 2013-05-30 NOTE — Telephone Encounter (Signed)
Received message that patient called in reference to paperwork that his provider had filled out for him. He advised he now needs to have something to explain his medications and his cardiovascular diagnosis. Advised him we will need to get a copy of the letter he received so that we can make sure he gets what he needs and at that time I can take it to the provider to see if she will write him a letter of explanation if that is what they are asking for. He advised he will stop by the clinic on tomorrow to drop the letter off. Gave him the fax number just in case it is easier for him to fax it to [place]. Advised him to give it a few days as she is in and out of the clinic over the next couple of days, but that the sooner we get the letter the sooner we can start working on what he needs.

## 2013-08-15 ENCOUNTER — Other Ambulatory Visit (INDEPENDENT_AMBULATORY_CARE_PROVIDER_SITE_OTHER): Payer: Self-pay

## 2013-08-15 ENCOUNTER — Telehealth: Payer: Self-pay | Admitting: *Deleted

## 2013-08-15 ENCOUNTER — Other Ambulatory Visit: Payer: Self-pay | Admitting: Internal Medicine

## 2013-08-15 DIAGNOSIS — B20 Human immunodeficiency virus [HIV] disease: Secondary | ICD-10-CM

## 2013-08-15 LAB — CBC WITH DIFFERENTIAL/PLATELET
Basophils Absolute: 0.1 10*3/uL (ref 0.0–0.1)
Basophils Relative: 2 % — ABNORMAL HIGH (ref 0–1)
Eosinophils Absolute: 0.5 10*3/uL (ref 0.0–0.7)
Eosinophils Relative: 12 % — ABNORMAL HIGH (ref 0–5)
Lymphs Abs: 1 10*3/uL (ref 0.7–4.0)
MCH: 34 pg (ref 26.0–34.0)
MCHC: 34.7 g/dL (ref 30.0–36.0)
MCV: 97.8 fL (ref 78.0–100.0)
Monocytes Relative: 8 % (ref 3–12)
Neutrophils Relative %: 53 % (ref 43–77)
Platelets: 233 10*3/uL (ref 150–400)
RBC: 4.65 MIL/uL (ref 4.22–5.81)

## 2013-08-15 LAB — COMPREHENSIVE METABOLIC PANEL
AST: 16 U/L (ref 0–37)
BUN: 10 mg/dL (ref 6–23)
CO2: 33 mEq/L — ABNORMAL HIGH (ref 19–32)
Creat: 1.12 mg/dL (ref 0.50–1.35)
Glucose, Bld: 89 mg/dL (ref 70–99)
Sodium: 138 mEq/L (ref 135–145)
Total Bilirubin: 0.7 mg/dL (ref 0.3–1.2)

## 2013-08-15 NOTE — Telephone Encounter (Signed)
Patient was in clinic today to have labs drawn and advised that he never received an explanation letter the [facility] requested. After researching advised the patient he was to bring the letter requesting information by the clinic back in June so that we could find out what they were requesting as we had previously filled out a form letter they needed. He was on his way out of town and never came by the clinic. Patient advised he will try to find the letter and bring it by the office asap. Advised him to give it to me when he finds it.

## 2013-08-16 LAB — T-HELPER CELL (CD4) - (RCID CLINIC ONLY)
CD4 % Helper T Cell: 17 % — ABNORMAL LOW (ref 33–55)
CD4 T Cell Abs: 180 /uL — ABNORMAL LOW (ref 400–2700)

## 2013-08-17 LAB — HIV-1 RNA QUANT-NO REFLEX-BLD: HIV-1 RNA Quant, Log: <1.3 {Log} (ref <1.30)

## 2013-08-22 ENCOUNTER — Encounter: Payer: Self-pay | Admitting: *Deleted

## 2013-08-30 ENCOUNTER — Ambulatory Visit: Payer: Self-pay | Admitting: Internal Medicine

## 2013-08-31 ENCOUNTER — Ambulatory Visit: Payer: Self-pay | Admitting: Internal Medicine

## 2013-09-01 ENCOUNTER — Encounter: Payer: Self-pay | Admitting: *Deleted

## 2013-09-15 ENCOUNTER — Other Ambulatory Visit: Payer: Self-pay

## 2013-09-16 ENCOUNTER — Encounter: Payer: Self-pay | Admitting: *Deleted

## 2013-10-10 ENCOUNTER — Other Ambulatory Visit: Payer: Self-pay | Admitting: *Deleted

## 2013-10-10 DIAGNOSIS — B20 Human immunodeficiency virus [HIV] disease: Secondary | ICD-10-CM

## 2013-10-10 MED ORDER — DOLUTEGRAVIR SODIUM 50 MG PO TABS: 50.0000 mg | ORAL_TABLET | Freq: Every day | ORAL | Status: DC

## 2013-10-10 MED ORDER — EMTRICITABINE-TENOFOVIR DF 200-300 MG PO TABS: 1.0000 | ORAL_TABLET | Freq: Every day | ORAL | Status: DC

## 2013-10-11 ENCOUNTER — Encounter: Payer: Self-pay | Admitting: Internal Medicine

## 2013-10-11 ENCOUNTER — Ambulatory Visit (INDEPENDENT_AMBULATORY_CARE_PROVIDER_SITE_OTHER): Payer: Self-pay | Admitting: Internal Medicine

## 2013-10-11 VITALS — BP 127/88 | HR 83 | Temp 97.2°F | Wt 184.0 lb

## 2013-10-11 DIAGNOSIS — Z23 Encounter for immunization: Secondary | ICD-10-CM

## 2013-10-11 DIAGNOSIS — B20 Human immunodeficiency virus [HIV] disease: Secondary | ICD-10-CM

## 2013-10-11 NOTE — Progress Notes (Signed)
Subjective:    [name], male    DOB: 23-May-1966, 47 y.o.   MRN: [id number]  HPI Colin Alexander, Colin Alexander 47yo M with CD 4 count of 180/VL<20,on DLG/TRV. And bactrim for PCP proph. Doing well. Missed 2 doses in the last month.   Current Outpatient Prescriptions on File Prior to Visit  Medication Sig Dispense Refill  . dolutegravir (TIVICAY) 50 MG tablet Take 1 tablet (50 mg total) by mouth daily.  30 tablet  2  . emtricitabine-tenofovir (TRUVADA) 200-300 MG per tablet Take 1 tablet by mouth daily.  30 tablet  2  . lisinopril-hydrochlorothiazide (PRINZIDE,ZESTORETIC) 20-25 MG per tablet Take 1 tablet by mouth daily.  30 tablet  11  . sulfamethoxazole-trimethoprim (BACTRIM DS) 800-160 MG per tablet Take 1 tablet by mouth daily.  30 tablet  11  . valACYclovir (VALTREX) 1000 MG tablet Take 1 tablet (1,000 mg total) by mouth daily.  30 tablet  11   No current facility-administered medications on file prior to visit.   Active Ambulatory Problems    Diagnosis Date Noted  . HTN (hypertension) 12/31/2011  . HIV (human immunodeficiency virus infection) 01/13/2012  . HSV (herpes simplex virus) anogenital infection 06/07/2012  . Chest pain 10/17/2012  . Atypical chest pain 10/18/2012  . Family history of premature CAD 10/18/2012  . Bronchitis 10/19/2012   Resolved Ambulatory Problems    Diagnosis Date Noted  . No Resolved Ambulatory Problems   Past Medical History  Diagnosis Date  . Hypertension      SH : not in a relationship presently  FH: father AD (dementia)  Review of Systems  Constitutional: Negative for fever, chills, diaphoresis, activity change, appetite change, fatigue and unexpected weight change.  HENT: Negative for congestion, sore throat, rhinorrhea, sneezing, trouble swallowing and sinus pressure.  Eyes: Negative for photophobia and visual disturbance.  Respiratory: Negative for cough, chest tightness, shortness of breath, wheezing and stridor.  Cardiovascular:  Negative for chest pain, palpitations and leg swelling.  Gastrointestinal: Negative for nausea, vomiting, abdominal pain, diarrhea, constipation, blood in stool, abdominal distention and anal bleeding.  Genitourinary: Negative for dysuria, hematuria, flank pain and difficulty urinating.  Musculoskeletal: Negative for myalgias, back pain, joint swelling, arthralgias and gait problem.  Skin: Negative for color change, pallor, rash and wound.  Neurological: Negative for dizziness, tremors, weakness and light-headedness.  Hematological: Negative for adenopathy. Does not bruise/bleed easily.  Psychiatric/Behavioral: Negative for behavioral problems, confusion, sleep disturbance, dysphoric mood, decreased concentration and agitation.       Objective:   Physical Exam BP 127/88  Pulse 83  Temp(Src) 97.2 F (36.2 C) (Oral)  Wt 184 lb (83.462 kg) Physical Exam  Constitutional: oriented to person, place, and time. He appears well-developed and well-nourished. No distress.  HENT:  Mouth/Throat: Oropharynx is clear and moist. No oropharyngeal exudate.  Cardiovascular: Normal rate, regular rhythm and normal heart sounds. Exam reveals no gallop and no friction rub.  No murmur heard.  Pulmonary/Chest: Effort normal and breath sounds normal. No respiratory distress. He has no wheezes.  Abdominal: Soft. Bowel sounds are normal. He exhibits no distension. There is no tenderness.  Lymphadenopathy:  He has no cervical adenopathy.  Neurological: He is alert and oriented to person, place, and time.  Skin: Skin is warm and dry. No rash noted. No erythema.  Psychiatric: He has a normal mood and affect. His behavior is normal.       Assessment & Plan:  HIV= continue on taking DLG/Truvada. Marcelino Duster met with patient  to discussed adherence. Still concern that he is missing 2 doses in 30 days  OI proph = continue Bactrim daily  Health maintenance = flu shot  rtc feb, labs 2 wk prior

## 2013-10-11 NOTE — Progress Notes (Signed)
  [facility] - Pharmacist    HPI: Colin Alexander is a 47 y.o. male here for follow-up.  Allergies: Allergies  Allergen Reactions  . Penicillins Shortness Of Breath and Rash    Vitals: Temp: 97.2 F (36.2 C) (12/02 0939) Temp src: Oral (12/02 0939) BP: 127/88 mmHg (12/02 0939) Pulse Rate: 83 (12/02 0939)  Past Medical History: Past Medical History  Diagnosis Date  . Hypertension   . HIV (human immunodeficiency virus infection)     Social History: History   Social History  . Marital Status: Divorced    Spouse Name: N/A    Number of Children: N/A  . Years of Education: N/A   Social History Main Topics  . Smoking status: Never Smoker   . Smokeless tobacco: Never Used  . Alcohol Use: No  . Drug Use: No  . Sexual Activity: Yes    Birth Control/ Protection: Condom   Other Topics Concern  . None   Social History Narrative  . None   Current Regimen: Dolutegravir, Truvada  Labs: HIV 1 RNA Quant (copies/mL)  Date Value  08/15/2013 <20   04/14/2013 <20   01/06/2013 <20      CD4 T Cell Abs (/uL)  Date Value  08/15/2013 180*  04/14/2013 250*  01/06/2013 160*     Hep B S Ab (no units)  Date Value  12/30/2011 POS*     Hepatitis B Surface Ag (no units)  Date Value  12/30/2011 NEGATIVE      HCV Ab (no units)  Date Value  12/30/2011 NEGATIVE     CrCl: The CrCl is unknown because both a height and weight (above a minimum accepted value) are required for this calculation.  Lipids:    Component Value Date/Time   CHOL 128 01/06/2013 1118   TRIG 75 01/06/2013 1118   HDL 37* 01/06/2013 1118   CHOLHDL 3.5 01/06/2013 1118   VLDL 15 01/06/2013 1118   LDLCALC 76 01/06/2013 1118    Assessment: 47 year old male currently on dolutegravir/truvada.  He estimates he has only missed 2 doses in the past 2 months.  He has concerns over his blood pressure and tells me he needs to improve his compliance with his lisinopril/HCTZ.  He takes all of  his medications at the same time, which makes me question if he is as compliant with his HIV regimen as he states.  Recommendations: We discussed multiple adherence strategies.  He was provided with a pillbox for travel that he plans to use, but otherwise was not interested in any other adherence strategy.  He would prefer to try taking all of his medications in the morning, with a reminder to himself at 1:15pm to double-check that he took his medications for that day. I feel he has additional adherence needs.  Will follow-up at his next visit for further adherence counseling.  Sallee Provencal, Pharm.D., BCPS, AAHIVP Clinical Infectious Disease Pharmacist [facility] 10/11/2013, 10:21 AM

## 2013-12-10 IMAGING — CT CT ANGIO CHEST
1 of 6 series · 5 of 36 positions shown · IV contrast (omnipaque)
Comparison: Chest radiograph 10/17/2012

CLINICAL DATA: Shortness of breath.

CT ANGIOGRAPHY CHEST
TECHNIQUE: Multidetector CT imaging of the chest using the
standard protocol during bolus administration of intravenous
contrast. Multiplanar reconstructed images including MIPs were
obtained and reviewed to evaluate the vascular anatomy.
Contrast: 100mL OMNIPAQUE IOHEXOL 350 MG/ML SOLN

[Series 4: pe 3.0 b40f · axial · 0.66mm/px · z∈[-345,-153]mm · 5 of 97 slices shown]
[im 17/97  lung]
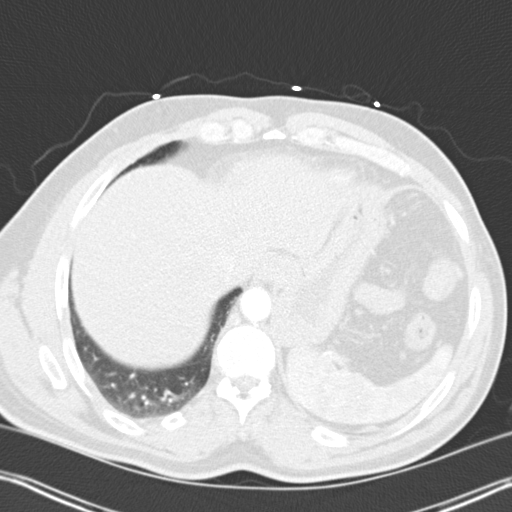
[im 33/97  mediastinal]
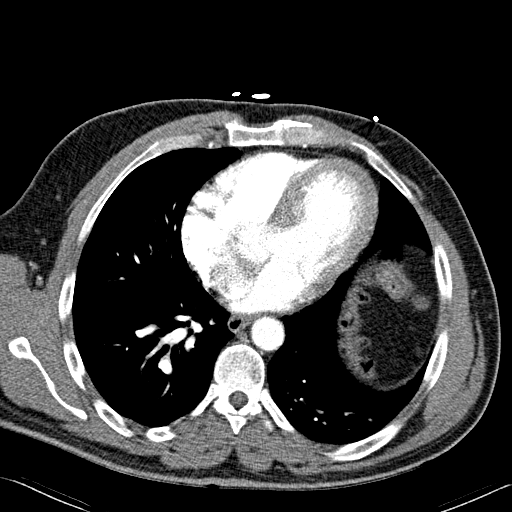
[im 49/97  lung]
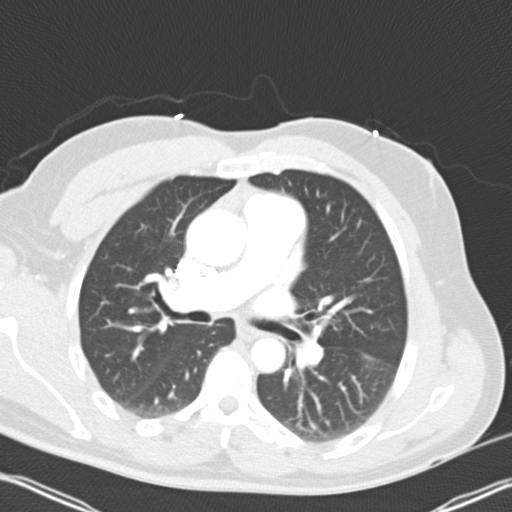
[im 65/97  mediastinal]
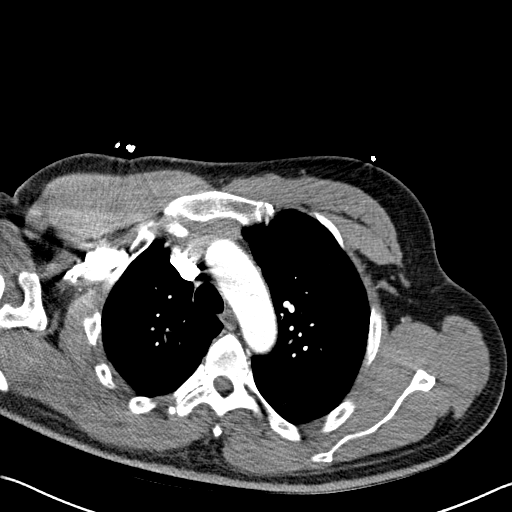
[im 81/97  lung]
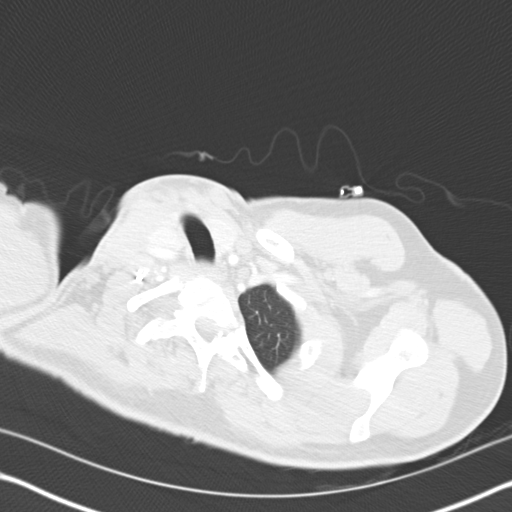

[5 of 36 positions shown; findings below may reference images not displayed]

FINDINGS: There is no evidence for pulmonary embolism.  There is no
evidence for chest lymphadenopathy.  The heart size is normal.  No
significant pericardial or pleural fluid.  Images of the upper
abdomen are unremarkable.

The trachea and mainstem bronchi are patent.  The patient appears
rotated in the region of the cervicothoracic junction.  There is
mild dependent atelectasis in the lower lobes.  No evidence for
airspace disease or lung consolidation.  No acute bony
abnormalities.
IMPRESSION: Negative for pulmonary embolism.

No acute chest findings.

## 2013-12-12 ENCOUNTER — Other Ambulatory Visit: Payer: Self-pay | Admitting: *Deleted

## 2013-12-12 DIAGNOSIS — B20 Human immunodeficiency virus [HIV] disease: Secondary | ICD-10-CM

## 2013-12-12 IMAGING — NM NM MYOCAR SINGLE W/PLANAR W/WALL MOTION & EF
1 series · 6 of 6 positions shown · non-contrast
Comparison: none

[Series 1: cs cardiac tc hi dose · 6.41mm/px · 6 of 512 frames shown]
[frame 43/512]
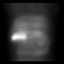
[frame 128/512]
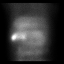
[frame 214/512]
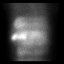
[frame 299/512]
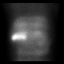
[frame 384/512]
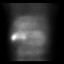
[frame 470/512]
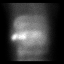

[6 of 6 positions shown; findings below may reference images not displayed]

Canned report from images found in remote index.

Refer to host system for actual result text.

## 2013-12-12 MED ORDER — SULFAMETHOXAZOLE-TMP DS 800-160 MG PO TABS: 1.0000 | ORAL_TABLET | Freq: Every day | ORAL | Status: DC

## 2013-12-12 MED ORDER — EMTRICITABINE-TENOFOVIR DF 200-300 MG PO TABS
1.0000 | ORAL_TABLET | Freq: Every day | ORAL | Status: DC
Start: 2013-12-12 — End: 2014-01-18

## 2013-12-12 MED ORDER — DOLUTEGRAVIR SODIUM 50 MG PO TABS: 50.0000 mg | ORAL_TABLET | Freq: Every day | ORAL | Status: DC

## 2013-12-12 MED ORDER — VALACYCLOVIR HCL 1 G PO TABS: 1000.0000 mg | ORAL_TABLET | Freq: Every day | ORAL | Status: DC

## 2013-12-29 ENCOUNTER — Ambulatory Visit: Payer: BC Managed Care – PPO

## 2013-12-29 ENCOUNTER — Other Ambulatory Visit (INDEPENDENT_AMBULATORY_CARE_PROVIDER_SITE_OTHER): Payer: Self-pay

## 2013-12-29 DIAGNOSIS — B20 Human immunodeficiency virus [HIV] disease: Secondary | ICD-10-CM

## 2013-12-29 LAB — BASIC METABOLIC PANEL WITH GFR
BUN: 8 mg/dL (ref 6–23)
CALCIUM: 9.2 mg/dL (ref 8.4–10.5)
CO2: 28 mEq/L (ref 19–32)
Chloride: 100 mEq/L (ref 96–112)
Creat: 1.04 mg/dL (ref 0.50–1.35)
GFR, Est African American: >89 mL/min
GFR, Est Non African American: 84 mL/min
Glucose, Bld: 83 mg/dL (ref 70–99)
Potassium: 3.2 mEq/L — ABNORMAL LOW (ref 3.5–5.3)
Sodium: 138 mEq/L (ref 135–145)

## 2013-12-29 LAB — CBC WITH DIFFERENTIAL/PLATELET
Basophils Absolute: 0.1 10*3/uL (ref 0.0–0.1)
Basophils Relative: 2 % — ABNORMAL HIGH (ref 0–1)
EOS ABS: 0.3 10*3/uL (ref 0.0–0.7)
EOS PCT: 7 % — AB (ref 0–5)
HCT: 43.5 % (ref 39.0–52.0)
Hemoglobin: 15.4 g/dL (ref 13.0–17.0)
LYMPHS ABS: 0.9 10*3/uL (ref 0.7–4.0)
LYMPHS PCT: 21 % (ref 12–46)
MCH: 34.5 pg — AB (ref 26.0–34.0)
MCHC: 35.4 g/dL (ref 30.0–36.0)
MCV: 97.5 fL (ref 78.0–100.0)
MONOS PCT: 7 % (ref 3–12)
Monocytes Absolute: 0.3 10*3/uL (ref 0.1–1.0)
Neutro Abs: 2.6 10*3/uL (ref 1.7–7.7)
Neutrophils Relative %: 63 % (ref 43–77)
Platelets: 255 10*3/uL (ref 150–400)
RBC: 4.46 MIL/uL (ref 4.22–5.81)
RDW: 12.4 % (ref 11.5–15.5)
WBC: 4.1 10*3/uL (ref 4.0–10.5)

## 2013-12-30 LAB — T-HELPER CELL (CD4) - (RCID CLINIC ONLY)
CD4 T CELL ABS: 230 /uL — AB (ref 400–2700)
CD4 T CELL HELPER: 23 % — AB (ref 33–55)

## 2013-12-31 LAB — HIV-1 RNA QUANT-NO REFLEX-BLD
HIV 1 RNA Quant: <20 copies/mL (ref <20)
HIV-1 RNA Quant, Log: <1.3 {Log} (ref <1.30)

## 2014-01-12 ENCOUNTER — Encounter: Payer: Self-pay | Admitting: *Deleted

## 2014-01-12 ENCOUNTER — Encounter: Payer: Self-pay | Admitting: Internal Medicine

## 2014-01-12 ENCOUNTER — Ambulatory Visit (INDEPENDENT_AMBULATORY_CARE_PROVIDER_SITE_OTHER): Payer: Self-pay | Admitting: Internal Medicine

## 2014-01-12 VITALS — BP 135/86 | HR 80 | Temp 98.8°F | Wt 182.0 lb

## 2014-01-12 DIAGNOSIS — B20 Human immunodeficiency virus [HIV] disease: Secondary | ICD-10-CM

## 2014-01-12 DIAGNOSIS — Z0000 Encounter for general adult medical examination without abnormal findings: Secondary | ICD-10-CM

## 2014-01-12 DIAGNOSIS — Z23 Encounter for immunization: Secondary | ICD-10-CM

## 2014-01-12 NOTE — Addendum Note (Signed)
Addended by: Janyce Llanos F on: 01/12/2014 12:12 PM   Modules accepted: Orders

## 2014-01-12 NOTE — Progress Notes (Signed)
Subjective:    [name], male    DOB: 12-Sep-1966, 48 y.o.   MRN: [id number]  HPI 48yo M with HIV, Cd 4 count 230/VL <20, tivicay/truvada. And oi proph with bactrim. Doing well with medication. Only missed 1 dose in 90 days. Also did have a bout of nausea with taking meds without eating. Overall doing well except for weight gain. He is going to start diet modification and increasing exercise in his schedule.   Current Outpatient Prescriptions on File Prior to Visit  Medication Sig Dispense Refill  . dolutegravir (TIVICAY) 50 MG tablet Take 1 tablet (50 mg total) by mouth daily.  30 tablet  2  . emtricitabine-tenofovir (TRUVADA) 200-300 MG per tablet Take 1 tablet by mouth daily.  30 tablet  2  . lisinopril-hydrochlorothiazide (PRINZIDE,ZESTORETIC) 20-25 MG per tablet Take 1 tablet by mouth daily.  30 tablet  11  . sulfamethoxazole-trimethoprim (BACTRIM DS) 800-160 MG per tablet Take 1 tablet by mouth daily.  30 tablet  11  . valACYclovir (VALTREX) 1000 MG tablet Take 1 tablet (1,000 mg total) by mouth daily.  30 tablet  2   No current facility-administered medications on file prior to visit.   Active Ambulatory Problems    Diagnosis Date Noted  . HTN (hypertension) 12/31/2011  . HIV (human immunodeficiency virus infection) 01/13/2012  . HSV (herpes simplex virus) anogenital infection 06/07/2012  . Chest pain 10/17/2012  . Atypical chest pain 10/18/2012  . Family history of premature CAD 10/18/2012  . Bronchitis 10/19/2012   Resolved Ambulatory Problems    Diagnosis Date Noted  . No Resolved Ambulatory Problems   Past Medical History  Diagnosis Date  . Hypertension        Review of Systems   Constitutional: Negative for fever, chills, diaphoresis, activity change, appetite change, fatigue and unexpected weight change.  HENT: Negative for congestion, sore throat, rhinorrhea, sneezing, trouble swallowing and sinus pressure.  Eyes: Negative for photophobia  and visual disturbance.  Respiratory: Negative for cough, chest tightness, shortness of breath, wheezing and stridor.  Cardiovascular: Negative for chest pain, palpitations and leg swelling.  Gastrointestinal: Negative for nausea, vomiting, abdominal pain, diarrhea, constipation, blood in stool, abdominal distention and anal bleeding.  Genitourinary: Negative for dysuria, hematuria, flank pain and difficulty urinating.  Musculoskeletal: Negative for myalgias, back pain, joint swelling, arthralgias and gait problem.  Skin: Negative for color change, pallor, rash and wound.  Neurological: Negative for dizziness, tremors, weakness and light-headedness.  Hematological: Negative for adenopathy. Does not bruise/bleed easily.  Psychiatric/Behavioral: Negative for behavioral problems, confusion, sleep disturbance, dysphoric mood, decreased concentration and agitation.       Objective:   Physical Exam There were no vitals taken for this visit. Physical Exam  Constitutional: He is oriented to person, place, and time. He appears well-developed and well-nourished. No distress.  HENT:  Mouth/Throat: Oropharynx is clear and moist. No oropharyngeal exudate.  Cardiovascular: Normal rate, regular rhythm and normal heart sounds. Exam reveals no gallop and no friction rub.  No murmur heard.  Pulmonary/Chest: Effort normal and breath sounds normal. No respiratory distress. He has no wheezes.  Abdominal: Soft. Bowel sounds are normal. He exhibits no distension. There is no tenderness.  Lymphadenopathy:  He has no cervical adenopathy.  Neurological: He is alert and oriented to person, place, and time.  Skin: Skin is warm and dry. No rash noted. No erythema.  Psychiatric: He has a normal mood and affect. His behavior is normal.  Assessment & Plan:  HIV = continue tivicay/truvada  oi proph = can discontinue bactrim  Health maintenance = will give hep A #2 imms today (off schedule); will do ur  chalm, gc, and ua  & lipids & HCV at next visit  rtc in 3 months

## 2014-01-16 ENCOUNTER — Other Ambulatory Visit: Payer: Self-pay | Admitting: *Deleted

## 2014-01-16 DIAGNOSIS — I10 Essential (primary) hypertension: Secondary | ICD-10-CM

## 2014-01-16 MED ORDER — LISINOPRIL-HYDROCHLOROTHIAZIDE 20-25 MG PO TABS: 1.0000 | ORAL_TABLET | Freq: Every day | ORAL | Status: DC

## 2014-01-17 ENCOUNTER — Ambulatory Visit (INDEPENDENT_AMBULATORY_CARE_PROVIDER_SITE_OTHER): Payer: BC Managed Care – PPO | Admitting: *Deleted

## 2014-01-17 VITALS — BP 126/84 | HR 67 | Temp 97.8°F | Resp 16 | Ht 67.75 in | Wt 182.5 lb

## 2014-01-17 DIAGNOSIS — Z21 Asymptomatic human immunodeficiency virus [HIV] infection status: Secondary | ICD-10-CM

## 2014-01-17 DIAGNOSIS — B20 Human immunodeficiency virus [HIV] disease: Secondary | ICD-10-CM

## 2014-01-17 LAB — COMPREHENSIVE METABOLIC PANEL
ALT: 22 U/L (ref 0–53)
AST: 17 U/L (ref 0–37)
Albumin: 4.5 g/dL (ref 3.5–5.2)
Alkaline Phosphatase: 40 U/L (ref 39–117)
BUN: 9 mg/dL (ref 6–23)
CALCIUM: 9.4 mg/dL (ref 8.4–10.5)
CHLORIDE: 101 meq/L (ref 96–112)
CO2: 30 mEq/L (ref 19–32)
CREATININE: 1.09 mg/dL (ref 0.50–1.35)
GLUCOSE: 87 mg/dL (ref 70–99)
POTASSIUM: 3.8 meq/L (ref 3.5–5.3)
Sodium: 140 mEq/L (ref 135–145)
Total Bilirubin: 0.7 mg/dL (ref 0.2–1.2)
Total Protein: 7.5 g/dL (ref 6.0–8.3)

## 2014-01-17 LAB — HEPATIC FUNCTION PANEL
ALK PHOS: 40 U/L (ref 39–117)
ALT: 22 U/L (ref 0–53)
AST: 17 U/L (ref 0–37)
Albumin: 4.5 g/dL (ref 3.5–5.2)
BILIRUBIN INDIRECT: 0.6 mg/dL (ref 0.2–1.2)
BILIRUBIN TOTAL: 0.7 mg/dL (ref 0.2–1.2)
Bilirubin, Direct: 0.1 mg/dL (ref 0.0–0.3)
Total Protein: 7.5 g/dL (ref 6.0–8.3)

## 2014-01-17 LAB — LIPID PANEL
Cholesterol: 148 mg/dL (ref 0–200)
HDL: 39 mg/dL — AB (ref >39)
LDL Cholesterol: 100 mg/dL — ABNORMAL HIGH (ref 0–99)
Total CHOL/HDL Ratio: 3.8 Ratio
Triglycerides: 47 mg/dL (ref <150)
VLDL: 9 mg/dL (ref 0–40)

## 2014-01-17 LAB — LIPASE: LIPASE: 24 U/L (ref 0–75)

## 2014-01-17 NOTE — Progress Notes (Signed)
   Subjective:    [name], male    DOB: December 24, 1965, 48 y.o.   MRN: [id number]  HPI    Review of Systems  Constitutional: Negative.   HENT: Negative.   Eyes: Negative.   Respiratory: Negative.   Cardiovascular: Negative.   Gastrointestinal: Negative.   Genitourinary: Negative.   Musculoskeletal: Positive for joint swelling.  Skin: Negative.   Neurological: Negative.   Psychiatric/Behavioral: Negative.        Objective:   Physical Exam  Constitutional: He is oriented to person, place, and time. Vital signs are normal. He appears well-developed. He is cooperative.  HENT:  Mouth/Throat: Oropharynx is clear and moist and mucous membranes are normal. He has dentures. No oropharyngeal exudate.  Eyes: Conjunctivae are normal.  Cardiovascular: Normal rate, regular rhythm, S1 normal, S2 normal, normal heart sounds, intact distal pulses and normal pulses.   Pulmonary/Chest: Breath sounds normal. No respiratory distress.  Abdominal: Soft. Normal appearance and bowel sounds are normal. There is no hepatomegaly.  Musculoskeletal: Normal range of motion.       Left ankle: He exhibits no swelling and no ecchymosis.       Left foot: He exhibits tenderness and swelling.       Feet:  Lymphadenopathy:       Head (right side): No preauricular, no posterior auricular and no occipital adenopathy present.       Head (left side): No preauricular, no posterior auricular and no occipital adenopathy present.    He has no cervical adenopathy.       Right: No supraclavicular adenopathy present.       Left: No supraclavicular adenopathy present.  Neurological: He is alert and oriented to person, place, and time.  Skin: Skin is warm, dry and intact.  Psychiatric: He has a normal mood and affect. His speech is normal and behavior is normal. Thought content normal.  Patient's 4th toe on his left foot is bruised, slightly swollen. He said he dropped something on it at work yesterday and  it was sore all night. He is going to take the day off work and keep his foot up.He denies any other symptoms or problems.        Assessment & Plan:  Patient was here today to screen for A5325 Isotretinoin study looking at the immune system. Informed consent was obtained after he reviewed the protocol and we went over it together. All his questions were answered regarding the protocol.  He understands it is completely voluntary and that he has a 67% chance of getting put on isotretinoin. We reviewed some of the guidelines for the [facility] program and he was given the patient information book to review. He will return next week for the preentry exam as long as his labs deem him eligible.

## 2014-01-18 ENCOUNTER — Other Ambulatory Visit: Payer: Self-pay | Admitting: *Deleted

## 2014-01-18 DIAGNOSIS — B20 Human immunodeficiency virus [HIV] disease: Secondary | ICD-10-CM

## 2014-01-18 LAB — HIV-1 RNA QUANT-NO REFLEX-BLD: HIV-1 RNA Quant, Log: <1.3 {Log} (ref <1.30)

## 2014-01-18 LAB — HIV ANTIBODY (ROUTINE TESTING W REFLEX): HIV: REACTIVE

## 2014-01-18 LAB — HEPATITIS B SURFACE ANTIBODY,QUALITATIVE: Hep B S Ab: POSITIVE — AB

## 2014-01-18 LAB — HEPATITIS B SURFACE ANTIGEN: Hepatitis B Surface Ag: NEGATIVE

## 2014-01-18 LAB — HEPATITIS B CORE ANTIBODY, TOTAL: Hep B Core Total Ab: NONREACTIVE

## 2014-01-18 LAB — HEPATITIS C ANTIBODY: HCV AB: NEGATIVE

## 2014-01-18 MED ORDER — DOLUTEGRAVIR SODIUM 50 MG PO TABS: 50.0000 mg | ORAL_TABLET | Freq: Every day | ORAL | Status: DC

## 2014-01-18 MED ORDER — EMTRICITABINE-TENOFOVIR DF 200-300 MG PO TABS: 1.0000 | ORAL_TABLET | Freq: Every day | ORAL | Status: DC

## 2014-01-19 LAB — HIV 1/2 CONFIRMATION: HIV-2 Ab: NEGATIVE

## 2014-01-20 LAB — HIV-1 RNA, QUALITATIVE, TMA: HIV-1 RNA, Qualitative, TMA: NOT DETECTED

## 2014-01-25 ENCOUNTER — Ambulatory Visit (INDEPENDENT_AMBULATORY_CARE_PROVIDER_SITE_OTHER): Payer: Self-pay | Admitting: *Deleted

## 2014-01-25 VITALS — BP 126/89 | HR 66 | Temp 98.2°F | Resp 16 | Wt 183.5 lb

## 2014-01-25 DIAGNOSIS — B20 Human immunodeficiency virus [HIV] disease: Secondary | ICD-10-CM

## 2014-01-25 DIAGNOSIS — Z21 Asymptomatic human immunodeficiency virus [HIV] infection status: Secondary | ICD-10-CM

## 2014-01-25 LAB — GLUCOSE, RANDOM: Glucose, Bld: 111 mg/dL — ABNORMAL HIGH (ref 70–99)

## 2014-01-25 LAB — HEPATIC FUNCTION PANEL
ALBUMIN: 4.4 g/dL (ref 3.5–5.2)
ALT: 15 U/L (ref 0–53)
AST: 16 U/L (ref 0–37)
Alkaline Phosphatase: 42 U/L (ref 39–117)
BILIRUBIN DIRECT: 0.2 mg/dL (ref 0.0–0.3)
Indirect Bilirubin: 0.7 mg/dL (ref 0.2–1.2)
Total Bilirubin: 0.9 mg/dL (ref 0.2–1.2)
Total Protein: 7.3 g/dL (ref 6.0–8.3)

## 2014-01-25 LAB — LIPID PANEL
CHOLESTEROL: 136 mg/dL (ref 0–200)
HDL: 39 mg/dL — ABNORMAL LOW (ref >39)
LDL Cholesterol: 87 mg/dL (ref 0–99)
Total CHOL/HDL Ratio: 3.5 Ratio
Triglycerides: 52 mg/dL (ref <150)
VLDL: 10 mg/dL (ref 0–40)

## 2014-01-25 NOTE — Progress Notes (Signed)
Patient here today for preentry visit for A5325. He denies any new problems or concerns. His labs deem him eligible for study and he plans to enroll on 3/24.

## 2014-01-31 ENCOUNTER — Ambulatory Visit (INDEPENDENT_AMBULATORY_CARE_PROVIDER_SITE_OTHER): Payer: BC Managed Care – PPO | Admitting: *Deleted

## 2014-01-31 VITALS — BP 122/85 | HR 65 | Temp 97.6°F | Resp 16 | Wt 182.0 lb

## 2014-01-31 DIAGNOSIS — Z21 Asymptomatic human immunodeficiency virus [HIV] infection status: Secondary | ICD-10-CM

## 2014-01-31 DIAGNOSIS — B20 Human immunodeficiency virus [HIV] disease: Secondary | ICD-10-CM

## 2014-01-31 LAB — HEMOGLOBIN A1C
Hgb A1c MFr Bld: 4.8 % (ref <5.7)
Mean Plasma Glucose: 91 mg/dL (ref <117)

## 2014-01-31 MED ORDER — ISOTRETINOIN 30 MG PO CAPS: ORAL_CAPSULE | ORAL | Status: DC

## 2014-01-31 NOTE — Progress Notes (Signed)
   Subjective:    [name], male    DOB: 17-Mar-1966, 48 y.o.   MRN: [id number]  HPI    Review of Systems  Constitutional: Negative.   HENT: Negative.   Eyes: Negative.   Respiratory: Negative.   Cardiovascular: Negative.   Gastrointestinal: Negative.   Genitourinary: Negative.   Musculoskeletal: Negative.   Skin: Negative.   Neurological: Negative.   Psychiatric/Behavioral: Negative.        Objective:   Physical Exam  Constitutional: He is oriented to person, place, and time.  Eyes: No scleral icterus.  Cardiovascular: Normal rate, regular rhythm, normal heart sounds and intact distal pulses.   Pulmonary/Chest: Effort normal and breath sounds normal.  Abdominal: Soft. Bowel sounds are normal.  Musculoskeletal: Normal range of motion.  Lymphadenopathy:    He has no cervical adenopathy.  Neurological: He is alert and oriented to person, place, and time.  Skin: Skin is warm and dry. No rash noted.  Psychiatric: He has a normal mood and affect. His behavior is normal.          Assessment & Plan:  Patient here for entry visit for A5325 Eligibility rechecked and confirmed with patient. He denies any current problems and says his toes have healed up from the bruising. He was randomized to the isotretinoin arm. He signed the consent for the [facility] program and was instructed on adherence, side effects, watching for skin issues and mood changes. He was given both study coordinators contact numbers, including my cell phone number to call for concerns. He plans to start the medication today with his evening meal. He will return in 2 weeks.

## 2014-02-01 LAB — COMPREHENSIVE METABOLIC PANEL
ALT: 19 U/L (ref 0–53)
AST: 17 U/L (ref 0–37)
Albumin: 4.5 g/dL (ref 3.5–5.2)
Alkaline Phosphatase: 41 U/L (ref 39–117)
BUN: 10 mg/dL (ref 6–23)
CALCIUM: 9.8 mg/dL (ref 8.4–10.5)
CO2: 29 meq/L (ref 19–32)
CREATININE: 0.93 mg/dL (ref 0.50–1.35)
Chloride: 101 mEq/L (ref 96–112)
Glucose, Bld: 84 mg/dL (ref 70–99)
Potassium: 3.3 mEq/L — ABNORMAL LOW (ref 3.5–5.3)
Sodium: 141 mEq/L (ref 135–145)
Total Bilirubin: 0.7 mg/dL (ref 0.2–1.2)
Total Protein: 7.7 g/dL (ref 6.0–8.3)

## 2014-02-01 LAB — HEPATIC FUNCTION PANEL
ALT: 19 U/L (ref 0–53)
AST: 17 U/L (ref 0–37)
Albumin: 4.5 g/dL (ref 3.5–5.2)
Alkaline Phosphatase: 41 U/L (ref 39–117)
BILIRUBIN INDIRECT: 0.5 mg/dL (ref 0.2–1.2)
Bilirubin, Direct: 0.2 mg/dL (ref 0.0–0.3)
TOTAL PROTEIN: 7.7 g/dL (ref 6.0–8.3)
Total Bilirubin: 0.7 mg/dL (ref 0.2–1.2)

## 2014-02-01 LAB — LIPID PANEL
Cholesterol: 158 mg/dL (ref 0–200)
HDL: 47 mg/dL (ref >39)
LDL CALC: 99 mg/dL (ref 0–99)
Total CHOL/HDL Ratio: 3.4 Ratio
Triglycerides: 60 mg/dL (ref <150)
VLDL: 12 mg/dL (ref 0–40)

## 2014-02-01 LAB — LIPASE: Lipase: 28 U/L (ref 0–75)

## 2014-02-02 ENCOUNTER — Encounter: Payer: Self-pay | Admitting: Internal Medicine

## 2014-02-02 LAB — CD4/CD8 (T-HELPER/T-SUPPRESSOR CELL)
CD4%: 21.7
CD4: 195
CD8 T CELL SUPPRESSOR: 52.3
CD8: 471

## 2014-02-14 ENCOUNTER — Ambulatory Visit (INDEPENDENT_AMBULATORY_CARE_PROVIDER_SITE_OTHER): Payer: Self-pay | Admitting: *Deleted

## 2014-02-14 VITALS — BP 136/88 | HR 72 | Temp 97.9°F | Resp 14 | Wt 186.5 lb

## 2014-02-14 DIAGNOSIS — Z21 Asymptomatic human immunodeficiency virus [HIV] infection status: Secondary | ICD-10-CM

## 2014-02-14 DIAGNOSIS — B20 Human immunodeficiency virus [HIV] disease: Secondary | ICD-10-CM

## 2014-02-14 NOTE — Progress Notes (Signed)
Patient here for week 2 study visit. He says the only side effect he has noticed since starting the isotretinoin is dry, chapped lips. He will return in 2 weeks for the next study visit.

## 2014-02-22 ENCOUNTER — Encounter: Payer: Self-pay | Admitting: Internal Medicine

## 2014-02-27 ENCOUNTER — Encounter: Payer: Self-pay | Admitting: Internal Medicine

## 2014-02-27 LAB — CD4/CD8 (T-HELPER/T-SUPPRESSOR CELL)
CD4%: 20.5
CD4%: 21.7
CD4: 185
CD4: 217
CD8 % Suppressor T Cell: 55.4
CD8 T CELL SUPPRESSOR: 55.3
CD8: 499
CD8: 553

## 2014-02-27 LAB — HIV-1 RNA QUANT-NO REFLEX-BLD

## 2014-02-28 ENCOUNTER — Ambulatory Visit (INDEPENDENT_AMBULATORY_CARE_PROVIDER_SITE_OTHER): Payer: BC Managed Care – PPO | Admitting: *Deleted

## 2014-02-28 VITALS — BP 132/88 | HR 64 | Temp 97.6°F | Resp 16 | Wt 185.5 lb

## 2014-02-28 DIAGNOSIS — B20 Human immunodeficiency virus [HIV] disease: Secondary | ICD-10-CM

## 2014-02-28 DIAGNOSIS — Z21 Asymptomatic human immunodeficiency virus [HIV] infection status: Secondary | ICD-10-CM

## 2014-02-28 LAB — LIPID PANEL
CHOL/HDL RATIO: 3.4 ratio
Cholesterol: 145 mg/dL (ref 0–200)
HDL: 43 mg/dL (ref >39)
LDL Cholesterol: 91 mg/dL (ref 0–99)
TRIGLYCERIDES: 57 mg/dL (ref <150)
VLDL: 11 mg/dL (ref 0–40)

## 2014-02-28 LAB — COMPREHENSIVE METABOLIC PANEL
ALK PHOS: 39 U/L (ref 39–117)
ALT: 16 U/L (ref 0–53)
AST: 16 U/L (ref 0–37)
Albumin: 4.2 g/dL (ref 3.5–5.2)
BILIRUBIN TOTAL: 0.8 mg/dL (ref 0.2–1.2)
BUN: 10 mg/dL (ref 6–23)
CO2: 30 mEq/L (ref 19–32)
Calcium: 9.1 mg/dL (ref 8.4–10.5)
Chloride: 102 mEq/L (ref 96–112)
Creat: 0.9 mg/dL (ref 0.50–1.35)
Glucose, Bld: 87 mg/dL (ref 70–99)
Potassium: 4 mEq/L (ref 3.5–5.3)
Sodium: 138 mEq/L (ref 135–145)
Total Protein: 7.1 g/dL (ref 6.0–8.3)

## 2014-02-28 LAB — CBC WITH DIFFERENTIAL/PLATELET
Basophils Absolute: 0 10*3/uL (ref 0.0–0.1)
Basophils Relative: 1 % (ref 0–1)
EOS ABS: 0.2 10*3/uL (ref 0.0–0.7)
EOS PCT: 6 % — AB (ref 0–5)
HCT: 43.3 % (ref 39.0–52.0)
Hemoglobin: 15.4 g/dL (ref 13.0–17.0)
LYMPHS ABS: 0.9 10*3/uL (ref 0.7–4.0)
Lymphocytes Relative: 24 % (ref 12–46)
MCH: 34.3 pg — AB (ref 26.0–34.0)
MCHC: 35.6 g/dL (ref 30.0–36.0)
MCV: 96.4 fL (ref 78.0–100.0)
Monocytes Absolute: 0.3 10*3/uL (ref 0.1–1.0)
Monocytes Relative: 7 % (ref 3–12)
Neutro Abs: 2.3 10*3/uL (ref 1.7–7.7)
Neutrophils Relative %: 62 % (ref 43–77)
PLATELETS: 254 10*3/uL (ref 150–400)
RBC: 4.49 MIL/uL (ref 4.22–5.81)
RDW: 12.2 % (ref 11.5–15.5)
WBC: 3.7 10*3/uL — ABNORMAL LOW (ref 4.0–10.5)

## 2014-02-28 LAB — LIPASE: Lipase: 28 U/L (ref 0–75)

## 2014-02-28 LAB — BILIRUBIN,DIRECT & INDIRECT (FRACTIONATED)
Bilirubin, Direct: 0.2 mg/dL (ref 0.0–0.3)
Indirect Bilirubin: 0.6 mg/dL (ref 0.2–1.2)

## 2014-02-28 NOTE — Progress Notes (Signed)
Colin Alexander is here for A5325 (isotretinoin) study, week 4. He has been on study drug since 01/31/2014 and continues to report dry lips and newly drying of his face. He denies any mood/behavior changes. Questionnaires completed and vital signs stable. Fasting labs were obtained. He returned his medication box however he threw away one of the containers inside. He was dispensed study medication at this visit and received $50 gift card for visit. His next appointment is scheduled for Tuesday, Mar 28, 2014 @ 7:30am. Araceli Bouche RN

## 2014-03-09 ENCOUNTER — Emergency Department (HOSPITAL_COMMUNITY)
Admission: EM | Admit: 2014-03-09 | Discharge: 2014-03-09 | Disposition: A | Discharge status: Home / Self Care | Payer: BC Managed Care – PPO | Attending: Emergency Medicine | Admitting: Emergency Medicine

## 2014-03-09 ENCOUNTER — Encounter (HOSPITAL_COMMUNITY): Payer: Self-pay | Admitting: Emergency Medicine

## 2014-03-09 DIAGNOSIS — Y9389 Activity, other specified: Secondary | ICD-10-CM | POA: Insufficient documentation

## 2014-03-09 DIAGNOSIS — I10 Essential (primary) hypertension: Secondary | ICD-10-CM | POA: Insufficient documentation

## 2014-03-09 DIAGNOSIS — S91009A Unspecified open wound, unspecified ankle, initial encounter: Principal | ICD-10-CM

## 2014-03-09 DIAGNOSIS — Z21 Asymptomatic human immunodeficiency virus [HIV] infection status: Secondary | ICD-10-CM | POA: Insufficient documentation

## 2014-03-09 DIAGNOSIS — I83893 Varicose veins of bilateral lower extremities with other complications: Secondary | ICD-10-CM | POA: Insufficient documentation

## 2014-03-09 DIAGNOSIS — Z7982 Long term (current) use of aspirin: Secondary | ICD-10-CM | POA: Insufficient documentation

## 2014-03-09 DIAGNOSIS — I83891 Varicose veins of right lower extremities with other complications: Secondary | ICD-10-CM

## 2014-03-09 DIAGNOSIS — S81809A Unspecified open wound, unspecified lower leg, initial encounter: Principal | ICD-10-CM

## 2014-03-09 DIAGNOSIS — Y929 Unspecified place or not applicable: Secondary | ICD-10-CM | POA: Insufficient documentation

## 2014-03-09 DIAGNOSIS — Z880 Allergy status to penicillin: Secondary | ICD-10-CM | POA: Insufficient documentation

## 2014-03-09 DIAGNOSIS — X58XXXA Exposure to other specified factors, initial encounter: Secondary | ICD-10-CM | POA: Insufficient documentation

## 2014-03-09 DIAGNOSIS — S81009A Unspecified open wound, unspecified knee, initial encounter: Secondary | ICD-10-CM | POA: Insufficient documentation

## 2014-03-09 MED ORDER — HYDROGEN PEROXIDE 3 % EX SOLN
CUTANEOUS | Status: AC
  Filled 2014-03-09: qty 473

## 2014-03-09 MED ORDER — "THROMBI-PAD 3""X3"" EX PADS"
MEDICATED_PAD | CUTANEOUS | Status: AC
  Administered 2014-03-09: 11:00:00
  Filled 2014-03-09: qty 1

## 2014-03-09 NOTE — ED Notes (Signed)
Pt c/o laceration to to rle.

## 2014-03-09 NOTE — Discharge Instructions (Signed)
Bleeding Varicose Veins  Varicose veins are veins that have become enlarged and twisted. Valves in the veins help return blood from the leg to the heart. If these valves are damaged, blood flows backwards and backs up into the veins in the leg near the skin. This causes the veins to become larger because of increased pressure within. Sometimes these veins bleed.  CAUSES   Factors that can lead to bleeding varicose veins include:   Thinning of the skin that covers the veins. This skin is stretched as the veins enlarge.   Weak and thinning walls of the varicose veins. These thin walls are part of the reason why blood is not flowing normally to the heart.   Having high pressure in the veins. This high pressure occurs because the blood is not flowing freely back up to the heart.   Injury. Even a small injury to a varicose vein can cause bleeding.   Open wounds. A sore may develop near a varicose vein and not heal. This makes bleeding more likely.   Taking medicine that thins the blood. These medicines may include aspirin, anti-inflammatory medicine, and other blood thinners.  SYMPTOMS   If bleeding is on the outside surface of the skin, blood can be seen. Sometimes, the bleeding stays under the skin. If this happens, the blue or purple area will spread beyond the vein. This discoloration may be visible.  DIAGNOSIS   To decide if you have a bleeding varicose vein, your caregiver may:   Ask about your symptoms. This will include when you first saw bleeding.   Ask about how long you have had varicose veins and if they cause you problems.   Ask about your overall health.   Ask about possible causes, like recent cuts or if the area near the varicose veins was bumped or injured.   Examine the skin or leg that concerns you. Your caregiver will probably feel the veins.   Order imaging tests. These create detailed pictures of the veins.  TREATMENT   The first goal of treating bleeding varicose veins is to stop the  bleeding. Then, the aim is to keep any bleeding from happening again. Treatment will depend on the cause of the bleeding and how bad it is. Ask your caregiver about what would be best for you. Options include:   Raising (elevating) your leg. Lie down with your leg propped up on a pillow or cushion. Your foot should be above your heart.   Applying pressure to the spot that is bleeding. The bleeding should stop in a short time.   Wearing elastic stocking that "compress" your legs (compression stockings). An elastic bandage may do the same thing.   Applying an antibiotic cream on sores that are not healing.   Surgically removing or closing off the bleeding varicose veins.  HOME CARE INSTRUCTIONS    Apply any creams that your caregiver prescribed. Follow the directions carefully.   Wear compression stockings or any special wraps that were prescribed. Make sure you know:   If you should wear them every day.   How long you should wear them.   If veins were removed or closed, a bandage (dressing) will probably cover the area. Make sure you know:   How often the dressing should be changed.   Whether the area can get wet.   When you can leave the skin uncovered.   Check your skin every day. Look for new sores and signs of bleeding.   To   prevent future bleeding:   Use extra care in situations where you could cut your legs. Shaving, for example, or working outside in the garden.   Try to keep your legs elevated as much as possible. Lie down when you can.  SEEK MEDICAL CARE IF:    You have any questions about how to wear compression stockings or elastic bandages.   Your veins continue to bleed.   Sores develop near your varicose veins.   You have a sore that does not heal or gets bigger.   Pain increases in your leg.   The area around a varicose vein becomes warm, red, or tender to the touch.   You notice a yellowish fluid that smells bad coming from a spot where there was bleeding.   You develop a  fever of more than 100.5 F (38.1 C).  SEEK IMMEDIATE MEDICAL CARE IF:    You develop a fever of more than 102 F (38.9 C).  Document Released: 03/15/2009 Document Revised: 01/19/2012 Document Reviewed: 12/23/2010  [employer] Patient Information 2014 [employer].

## 2014-03-09 NOTE — ED Notes (Signed)
Pt ambulated around nurse's station independently. Denies dizziness or leg pain. No bleeding noted.

## 2014-03-10 NOTE — ED Provider Notes (Signed)
CSN: [id number]     Arrival date & time 03/09/14  5093 History   First MD Initiated Contact with Patient 03/09/14 [phone]     Chief Complaint  Patient presents with  . Laceration     (Consider location/radiation/quality/duration/timing/severity/associated sxs/prior Treatment) HPI Comments: Colin Alexander is a 48 y.o. male who is HIV positive, presents to the Emergency Department complaining of bleeding from a "scratch" to is right lower leg that occurred just PTA.  Patient reports hx of varicose veins of his lower legs and states that he scratched his leg and suddenly developed severe bleeding that was "squirting out all over the place".  He states that he applied pressure and the bleeding subsided but still bleeds with weight bearing.  He denies pain, swelling, dizziness, weakness  or discoloration.   The history is provided by the patient.    Past Medical History  Diagnosis Date  . Hypertension   . HIV (human immunodeficiency virus infection)    Past Surgical History  Procedure Laterality Date  . Circumcision    . Hernia repair     Family History  Problem Relation Age of Onset  . Heart attack Mother     Stent placement  . Heart attack Father     CABG  . Hypertension Mother   . Hypertension Father   . Heart attack Cousin     In his 49's deceased.   History  Substance Use Topics  . Smoking status: Never Smoker   . Smokeless tobacco: Never Used  . Alcohol Use: No    Review of Systems  Constitutional: Negative for fever and chills.  Musculoskeletal: Negative for arthralgias, back pain and joint swelling.  Skin: Positive for wound.       Puncture wound right lower leg  Neurological: Negative for dizziness, syncope, weakness, light-headedness, numbness and headaches.  Hematological: Does not bruise/bleed easily.  All other systems reviewed and are negative.     Allergies  Penicillins and Raspberry  Home Medications   Prior to Admission medications   Medication  Sig Start Date End Date Taking? Authorizing Provider  aspirin EC 81 MG tablet Take 81 mg by mouth daily as needed for mild pain.   Yes Historical Provider, MD  Aspirin-Salicylamide-Caffeine (BC HEADACHE POWDER PO) Take 1 packet by mouth daily as needed (pain or headache).   Yes Historical Provider, MD  dolutegravir (TIVICAY) 50 MG tablet Take 1 tablet (50 mg total) by mouth daily. 01/18/14  Yes Truman Hayward, MD  emtricitabine-tenofovir (TRUVADA) 200-300 MG per tablet Take 1 tablet by mouth daily. 01/18/14 01/18/15 Yes Truman Hayward, MD  ISOtretinoin (CLARAVIS) 40 MG capsule Take 80 mg by mouth daily.   Yes Historical Provider, MD  lisinopril-hydrochlorothiazide (PRINZIDE,ZESTORETIC) 20-25 MG per tablet Take 1 tablet by mouth daily. 01/16/14  Yes Carlyle Basques, MD  valACYclovir (VALTREX) 1000 MG tablet Take 1 tablet (1,000 mg total) by mouth daily. 12/12/13  Yes Carlyle Basques, MD   BP 139/108  Pulse 83  Temp(Src) 98.4 F (36.9 C)  Resp 18  Ht 5\' 8"  (1.727 m)  Wt 180 lb (81.647 kg)  BMI 27.38 kg/m2  SpO2 95% Physical Exam  Nursing note and vitals reviewed. Constitutional: He is oriented to person, place, and time. He appears well-developed and well-nourished. No distress.  HENT:  Head: Normocephalic and atraumatic.  Cardiovascular: Normal rate, regular rhythm, normal heart sounds and intact distal pulses.   No murmur heard. Pulmonary/Chest: Effort normal and breath sounds normal. No  respiratory distress.  Musculoskeletal: He exhibits no edema and no tenderness.  Neurological: He is alert and oriented to person, place, and time. He exhibits normal muscle tone. Coordination normal.  Skin: Skin is warm. Laceration noted.  Pin point puncture wound to lateral right lower leg over a small varicose vein.  No edema, no active bleeding on exam.  Distal sensation intact, DP pulse brisk. Pt has full ROM of the right  foot, knee and hip.    ED Course  Procedures (including critical care  time) Labs Review Labs Reviewed - No data to display  Imaging Review No results found.   EKG Interpretation None      MDM   Final diagnoses:  Bleeding from varicose veins of right lower extremity    Puncture wound to the right lower leg w/o active bleeding over the site of several small varicose veins.  NV intact.  Area was cleaned with saline and thrombi-pad applied and pressure dressing also applied by me. Pt observed and no further bleeding or complications.    Pt advised to leave dressing and thrombi-pad in place for 24 hrs then completely remove and apply baddage only.  Elevate leg when possible.  Pt is well appearing and stable for d/c.  He agrees to plan.      Aneisha Skyles L. Vanessa [place], PA-C 03/10/14 2201

## 2014-03-13 NOTE — ED Provider Notes (Signed)
Medical screening examination/treatment/procedure(s) were performed by non-physician practitioner and as supervising physician I was immediately available for consultation/collaboration.   EKG Interpretation None        [name] [name], MD 03/13/14 1512 

## 2014-03-18 ENCOUNTER — Other Ambulatory Visit: Payer: Self-pay | Admitting: Internal Medicine

## 2014-03-20 ENCOUNTER — Encounter: Payer: Self-pay | Admitting: Internal Medicine

## 2014-03-23 ENCOUNTER — Telehealth: Payer: Self-pay | Admitting: *Deleted

## 2014-03-23 NOTE — Telephone Encounter (Signed)
Colin Alexander had called 2 days ago saying that his lips were very dry and peeling from the isotretinoin and wanted to know if there was anything to use for them that would help. He said he had used carmax and vaseline. I checked with Dr. Tommy Medal and the study team for [phone] and they suggested using a hydrated petrolatum, aquafor for the lips or beeswax lip balm. This was the suggestions by the dermatologist on the study team. I called him and let him know and he said he would try one of those. He has an appointment for the study next week.

## 2014-03-23 NOTE — Telephone Encounter (Signed)
Sounds great Maudie Mercury, thanks!

## 2014-03-28 ENCOUNTER — Other Ambulatory Visit: Payer: Self-pay | Admitting: Internal Medicine

## 2014-03-28 ENCOUNTER — Ambulatory Visit (INDEPENDENT_AMBULATORY_CARE_PROVIDER_SITE_OTHER): Payer: Self-pay | Admitting: *Deleted

## 2014-03-28 VITALS — BP 118/83 | HR 67 | Temp 98.1°F | Resp 16 | Wt 181.2 lb

## 2014-03-28 DIAGNOSIS — Z21 Asymptomatic human immunodeficiency virus [HIV] infection status: Secondary | ICD-10-CM

## 2014-03-28 DIAGNOSIS — B20 Human immunodeficiency virus [HIV] disease: Secondary | ICD-10-CM

## 2014-03-28 LAB — COMPREHENSIVE METABOLIC PANEL
ALBUMIN: 4.3 g/dL (ref 3.5–5.2)
ALT: 24 U/L (ref 0–53)
AST: 21 U/L (ref 0–37)
Alkaline Phosphatase: 49 U/L (ref 39–117)
BUN: 6 mg/dL (ref 6–23)
CALCIUM: 9.3 mg/dL (ref 8.4–10.5)
CHLORIDE: 99 meq/L (ref 96–112)
CO2: 33 meq/L — AB (ref 19–32)
CREATININE: 0.9 mg/dL (ref 0.50–1.35)
Glucose, Bld: 94 mg/dL (ref 70–99)
Potassium: 3.6 mEq/L (ref 3.5–5.3)
SODIUM: 137 meq/L (ref 135–145)
TOTAL PROTEIN: 7.5 g/dL (ref 6.0–8.3)
Total Bilirubin: 1 mg/dL (ref 0.2–1.2)

## 2014-03-28 LAB — BILIRUBIN,DIRECT & INDIRECT (FRACTIONATED)
BILIRUBIN DIRECT: 0.2 mg/dL (ref 0.0–0.3)
Indirect Bilirubin: 0.8 mg/dL (ref 0.2–1.2)

## 2014-03-28 LAB — LIPID PANEL
Cholesterol: 157 mg/dL (ref 0–200)
HDL: 40 mg/dL (ref >39)
LDL CALC: 105 mg/dL — AB (ref 0–99)
TRIGLYCERIDES: 61 mg/dL (ref <150)
Total CHOL/HDL Ratio: 3.9 Ratio
VLDL: 12 mg/dL (ref 0–40)

## 2014-03-28 LAB — HEMOGLOBIN A1C
Hgb A1c MFr Bld: 4.8 % (ref <5.7)
MEAN PLASMA GLUCOSE: 91 mg/dL (ref <117)

## 2014-03-28 NOTE — Progress Notes (Signed)
   Subjective:    [name], male    DOB: 1966/03/12, 48 y.o.   MRN: [id number]  HPI    Review of Systems  Constitutional: Negative.   HENT: Negative.  Negative for postnasal drip.   Eyes: Negative.   Respiratory: Negative.   Cardiovascular: Negative.   Gastrointestinal: Negative.   Genitourinary: Negative.   Musculoskeletal: Negative.   Skin: Negative.   Neurological: Negative.   Psychiatric/Behavioral: Negative.        Objective:   Physical Exam  Constitutional: He is oriented to person, place, and time.  HENT:  Mouth/Throat: Oropharynx is clear and moist.  Eyes: No scleral icterus.  Cardiovascular: Normal rate, regular rhythm, normal heart sounds and intact distal pulses.   Pulmonary/Chest: Effort normal and breath sounds normal.  Abdominal: Soft. Bowel sounds are normal.  Musculoskeletal: Normal range of motion.  Lymphadenopathy:    He has no cervical adenopathy.  Neurological: He is alert and oriented to person, place, and time.  Skin: Skin is warm and dry.  There are many small varicosities on the lower extremities.  Lips are peeling and dry. Facial skin is also dry.         Assessment & Plan:  [name] is here for his 8 week study visit. He has been very adherent with his isotretinoin and his ARVS, not missing any doses. He says he was in the ED on 03/09/14 for a bleeding varicose vein. Apparently he pinched and it started spurting blood and wouldn't stop. He says it was a very small pinprick and it eventually stopped in the ED. He has had no further problems with it. He denies any other current problems besides the dry skin and lips. He is using medicated vaseline for his lips and he says it is helping. He will return in 4 weeks for the next visit.

## 2014-03-30 ENCOUNTER — Other Ambulatory Visit: Payer: Self-pay | Admitting: *Deleted

## 2014-03-30 DIAGNOSIS — Z21 Asymptomatic human immunodeficiency virus [HIV] infection status: Secondary | ICD-10-CM

## 2014-03-30 LAB — LIPASE: Lipase: 19 U/L (ref 0–75)

## 2014-03-30 NOTE — Addendum Note (Signed)
Addended by: Dolan Amen D on: 03/30/2014 11:42 AM   Modules accepted: Orders

## 2014-04-13 ENCOUNTER — Other Ambulatory Visit: Payer: Self-pay | Admitting: Internal Medicine

## 2014-04-18 ENCOUNTER — Encounter: Payer: Self-pay | Admitting: Internal Medicine

## 2014-04-18 LAB — CD4/CD8 (T-HELPER/T-SUPPRESSOR CELL)
CD4%: 21.2
CD4: 191
CD8 % Suppressor T Cell: 53.3
CD8: 480

## 2014-04-18 LAB — HIV-1 RNA QUANT-NO REFLEX-BLD: HIV-1 RNA Viral Load: <40

## 2014-04-25 ENCOUNTER — Ambulatory Visit (INDEPENDENT_AMBULATORY_CARE_PROVIDER_SITE_OTHER): Payer: Self-pay | Admitting: *Deleted

## 2014-04-25 VITALS — BP 135/94 | HR 76 | Temp 98.7°F | Resp 16 | Wt 182.8 lb

## 2014-04-25 DIAGNOSIS — B20 Human immunodeficiency virus [HIV] disease: Secondary | ICD-10-CM

## 2014-04-25 DIAGNOSIS — Z21 Asymptomatic human immunodeficiency virus [HIV] infection status: Secondary | ICD-10-CM

## 2014-04-25 NOTE — Progress Notes (Addendum)
Colin Alexander is here for his week 43 A5325 study visit. He continues to take the isotretinoin as directed for the study but has been having increasingly dry eyes and skin. He has noticed that his nose has been more irritated and bleeding a little inside. He also feels like his vision is blurred more in the mornings when he wakes up. His left eye is "bloodshot". He is using carmex on his lips and vaseline in his naries which helps. He doesn't want to cut back on the dosage for now since he only has 4 more weeks to take it.He has noticed that his skin is paler than normal since new skin is forming.  I did tell him to let me know if it got any worse and that he may need to stop the medication. He will return in 2 weeks for the next study visit.

## 2014-04-25 NOTE — Progress Notes (Signed)
   Subjective:    [name], male    DOB: Dec 05, 1965, 48 y.o.   MRN: [id number]  HPI    Review of Systems  Constitutional: Negative.   HENT: Positive for nosebleeds.   Eyes: Positive for visual disturbance.  Respiratory: Negative.   Cardiovascular: Negative.   Gastrointestinal: Negative.   Genitourinary: Negative.   Skin: Positive for color change.  Neurological: Negative.   Psychiatric/Behavioral: Negative.        Objective:   Physical Exam  Constitutional: He is oriented to person, place, and time.  Neck: No JVD present.  Cardiovascular: Normal rate, regular rhythm and intact distal pulses.   Pulmonary/Chest: Breath sounds normal. No respiratory distress.  Abdominal: Soft. Normal appearance and bowel sounds are normal. There is no hepatomegaly.  Musculoskeletal: Normal range of motion.       Right shoulder: Normal. He exhibits no swelling.  Lymphadenopathy:    He has no cervical adenopathy.  Neurological: He is alert and oriented to person, place, and time.  Skin: Skin is dry.  Psychiatric: He has a normal mood and affect. His behavior is normal.          Assessment & Plan:

## 2014-05-09 ENCOUNTER — Ambulatory Visit (INDEPENDENT_AMBULATORY_CARE_PROVIDER_SITE_OTHER): Payer: Self-pay | Admitting: *Deleted

## 2014-05-09 VITALS — BP 123/80 | HR 69 | Temp 98.2°F | Resp 14 | Wt 185.5 lb

## 2014-05-09 DIAGNOSIS — Z006 Encounter for examination for normal comparison and control in clinical research program: Secondary | ICD-10-CM

## 2014-05-09 DIAGNOSIS — B20 Human immunodeficiency virus [HIV] disease: Secondary | ICD-10-CM

## 2014-05-09 LAB — CBC WITH DIFFERENTIAL/PLATELET
Basophils Absolute: 0.1 10*3/uL (ref 0.0–0.1)
Basophils Relative: 2 % — ABNORMAL HIGH (ref 0–1)
Eosinophils Absolute: 0.3 10*3/uL (ref 0.0–0.7)
Eosinophils Relative: 7 % — ABNORMAL HIGH (ref 0–5)
HCT: 44.6 % (ref 39.0–52.0)
HEMOGLOBIN: 15.7 g/dL (ref 13.0–17.0)
LYMPHS ABS: 1 10*3/uL (ref 0.7–4.0)
LYMPHS PCT: 26 % (ref 12–46)
MCH: 33.9 pg (ref 26.0–34.0)
MCHC: 35.2 g/dL (ref 30.0–36.0)
MCV: 96.3 fL (ref 78.0–100.0)
MONOS PCT: 9 % (ref 3–12)
Monocytes Absolute: 0.4 10*3/uL (ref 0.1–1.0)
NEUTROS PCT: 56 % (ref 43–77)
Neutro Abs: 2.2 10*3/uL (ref 1.7–7.7)
PLATELETS: 249 10*3/uL (ref 150–400)
RBC: 4.63 MIL/uL (ref 4.22–5.81)
RDW: 12.5 % (ref 11.5–15.5)
WBC: 3.9 10*3/uL — AB (ref 4.0–10.5)

## 2014-05-09 LAB — LIPASE: Lipase: 45 U/L (ref 0–75)

## 2014-05-09 LAB — COMPREHENSIVE METABOLIC PANEL
ALBUMIN: 4 g/dL (ref 3.5–5.2)
ALT: 32 U/L (ref 0–53)
AST: 23 U/L (ref 0–37)
Alkaline Phosphatase: 61 U/L (ref 39–117)
BUN: 11 mg/dL (ref 6–23)
CO2: 30 meq/L (ref 19–32)
Calcium: 8.8 mg/dL (ref 8.4–10.5)
Chloride: 102 mEq/L (ref 96–112)
Creat: 1.02 mg/dL (ref 0.50–1.35)
Glucose, Bld: 82 mg/dL (ref 70–99)
POTASSIUM: 3.2 meq/L — AB (ref 3.5–5.3)
Sodium: 140 mEq/L (ref 135–145)
Total Bilirubin: 0.5 mg/dL (ref 0.2–1.2)
Total Protein: 7.1 g/dL (ref 6.0–8.3)

## 2014-05-09 LAB — LIPID PANEL
Cholesterol: 148 mg/dL (ref 0–200)
HDL: 35 mg/dL — ABNORMAL LOW (ref >39)
LDL CALC: 84 mg/dL (ref 0–99)
Total CHOL/HDL Ratio: 4.2 Ratio
Triglycerides: 146 mg/dL (ref <150)
VLDL: 29 mg/dL (ref 0–40)

## 2014-05-09 LAB — BILIRUBIN,DIRECT & INDIRECT (FRACTIONATED)
BILIRUBIN DIRECT: 0.1 mg/dL (ref 0.0–0.3)
Indirect Bilirubin: 0.4 mg/dL (ref 0.2–1.2)

## 2014-05-09 NOTE — Progress Notes (Signed)
Colin Alexander is here for his 14 week study visit. He says he has noticed an increase in achiness in his back and legs over the past 2 months. Also, has noticed soreness with deep breaths around his ribcage. Denies any cold sx or cough. His skin is still very dry and peeling in some areas. Lips are not as bad as 2 weeks ago. He has a red patch on his face where the skin is irritated. Left eye still a little bloodshot. He stops the isotretinoin in 2 weeks when he returns for the next study visit.

## 2014-05-18 ENCOUNTER — Other Ambulatory Visit: Payer: Self-pay | Admitting: Internal Medicine

## 2014-05-23 ENCOUNTER — Ambulatory Visit: Payer: Self-pay

## 2014-05-23 ENCOUNTER — Ambulatory Visit (INDEPENDENT_AMBULATORY_CARE_PROVIDER_SITE_OTHER): Payer: Self-pay | Admitting: *Deleted

## 2014-05-23 VITALS — BP 124/87 | HR 71 | Temp 98.2°F | Resp 16 | Wt 183.2 lb

## 2014-05-23 DIAGNOSIS — Z006 Encounter for examination for normal comparison and control in clinical research program: Secondary | ICD-10-CM

## 2014-05-23 DIAGNOSIS — B20 Human immunodeficiency virus [HIV] disease: Secondary | ICD-10-CM

## 2014-05-23 LAB — LIPID PANEL
Cholesterol: 139 mg/dL (ref 0–200)
HDL: 40 mg/dL (ref >39)
LDL CALC: 89 mg/dL (ref 0–99)
Total CHOL/HDL Ratio: 3.5 Ratio
Triglycerides: 51 mg/dL (ref <150)
VLDL: 10 mg/dL (ref 0–40)

## 2014-05-23 LAB — COMPREHENSIVE METABOLIC PANEL
ALBUMIN: 4.1 g/dL (ref 3.5–5.2)
ALT: 20 U/L (ref 0–53)
AST: 18 U/L (ref 0–37)
Alkaline Phosphatase: 49 U/L (ref 39–117)
BUN: 10 mg/dL (ref 6–23)
CALCIUM: 9 mg/dL (ref 8.4–10.5)
CHLORIDE: 102 meq/L (ref 96–112)
CO2: 30 meq/L (ref 19–32)
Creat: 0.91 mg/dL (ref 0.50–1.35)
Glucose, Bld: 83 mg/dL (ref 70–99)
POTASSIUM: 3.6 meq/L (ref 3.5–5.3)
SODIUM: 141 meq/L (ref 135–145)
Total Bilirubin: 0.8 mg/dL (ref 0.2–1.2)
Total Protein: 6.9 g/dL (ref 6.0–8.3)

## 2014-05-23 LAB — HEMOGLOBIN A1C
HEMOGLOBIN A1C: 4.6 % (ref <5.7)
Mean Plasma Glucose: 85 mg/dL (ref <117)

## 2014-05-23 LAB — LIPASE: Lipase: 28 U/L (ref 0–75)

## 2014-05-23 NOTE — Progress Notes (Signed)
   Subjective:    [name], male    DOB: September 12, 1966, 48 y.o.   MRN: [id number]  HPI    Review of Systems  Constitutional: Negative.   HENT: Negative.   Respiratory: Negative.   Cardiovascular: Negative.   Gastrointestinal: Negative.   Genitourinary: Negative.   Musculoskeletal: Negative for arthralgias and myalgias.  Skin: Negative.   Neurological: Negative.   Psychiatric/Behavioral: Negative.        Objective:   Physical Exam  Constitutional: He is oriented to person, place, and time.  HENT:  Mouth/Throat: Oropharynx is clear and moist and mucous membranes are normal. Oral lesions present.    Cardiovascular: Normal rate, regular rhythm, normal heart sounds and intact distal pulses.   Pulmonary/Chest: Effort normal and breath sounds normal.  Abdominal: Soft. Bowel sounds are normal.  Musculoskeletal: Normal range of motion.  Neurological: He is alert and oriented to person, place, and time.  Skin: Skin is warm and dry.  Psychiatric: He has a normal mood and affect. His behavior is normal.          Assessment & Plan:  Colin Alexander is here for his week 16 study visit. He took his last dose of isotretinoin yesterday at 12 noon. He returned his unused meds and had 16 pills or 8 days of treatment left. He should have had 4 pills left. He seemed to be a little stressed out from taking care of his father who has dementia. He says he still has some muscle aches and joint pains and his skin still feels very dry. I did not notice any peeling skin on his lips this time. He had 2 small red lesions in the back of his throat on exam, which he had not noticed. He denies any mood problems. He will return in 4 weeks for the next study visit.

## 2014-06-05 ENCOUNTER — Encounter: Payer: Self-pay | Admitting: Internal Medicine

## 2014-06-05 ENCOUNTER — Other Ambulatory Visit: Payer: Self-pay | Admitting: *Deleted

## 2014-06-05 DIAGNOSIS — B20 Human immunodeficiency virus [HIV] disease: Secondary | ICD-10-CM

## 2014-06-05 MED ORDER — EMTRICITABINE-TENOFOVIR DF 200-300 MG PO TABS: 1.0000 | ORAL_TABLET | Freq: Every day | ORAL | Status: DC

## 2014-06-05 MED ORDER — DOLUTEGRAVIR SODIUM 50 MG PO TABS: 50.0000 mg | ORAL_TABLET | Freq: Every day | ORAL | Status: DC

## 2014-06-05 NOTE — Telephone Encounter (Signed)
ADAP Application 

## 2014-06-20 ENCOUNTER — Ambulatory Visit (INDEPENDENT_AMBULATORY_CARE_PROVIDER_SITE_OTHER): Payer: Self-pay | Admitting: *Deleted

## 2014-06-20 VITALS — BP 141/95 | HR 67 | Temp 98.6°F | Resp 16 | Wt 183.2 lb

## 2014-06-20 DIAGNOSIS — Z006 Encounter for examination for normal comparison and control in clinical research program: Secondary | ICD-10-CM

## 2014-06-20 DIAGNOSIS — Z21 Asymptomatic human immunodeficiency virus [HIV] infection status: Secondary | ICD-10-CM

## 2014-06-20 DIAGNOSIS — B20 Human immunodeficiency virus [HIV] disease: Secondary | ICD-10-CM

## 2014-06-20 LAB — LIPID PANEL
CHOL/HDL RATIO: 3.3 ratio
Cholesterol: 140 mg/dL (ref 0–200)
HDL: 42 mg/dL (ref >39)
LDL CALC: 85 mg/dL (ref 0–99)
Triglycerides: 64 mg/dL (ref <150)
VLDL: 13 mg/dL (ref 0–40)

## 2014-06-20 LAB — COMPREHENSIVE METABOLIC PANEL
ALBUMIN: 4.3 g/dL (ref 3.5–5.2)
ALK PHOS: 50 U/L (ref 39–117)
ALT: 14 U/L (ref 0–53)
AST: 15 U/L (ref 0–37)
BUN: 7 mg/dL (ref 6–23)
CO2: 30 mEq/L (ref 19–32)
Calcium: 9.2 mg/dL (ref 8.4–10.5)
Chloride: 99 mEq/L (ref 96–112)
Creat: 0.97 mg/dL (ref 0.50–1.35)
GLUCOSE: 93 mg/dL (ref 70–99)
Potassium: 3.2 mEq/L — ABNORMAL LOW (ref 3.5–5.3)
SODIUM: 134 meq/L — AB (ref 135–145)
TOTAL PROTEIN: 7.3 g/dL (ref 6.0–8.3)
Total Bilirubin: 0.5 mg/dL (ref 0.2–1.2)

## 2014-06-20 LAB — CBC WITH DIFFERENTIAL/PLATELET
BASOS PCT: 2 % — AB (ref 0–1)
Basophils Absolute: 0.1 10*3/uL (ref 0.0–0.1)
Eosinophils Absolute: 0.4 10*3/uL (ref 0.0–0.7)
Eosinophils Relative: 9 % — ABNORMAL HIGH (ref 0–5)
HCT: 43.8 % (ref 39.0–52.0)
Hemoglobin: 15.2 g/dL (ref 13.0–17.0)
Lymphocytes Relative: 28 % (ref 12–46)
Lymphs Abs: 1.1 10*3/uL (ref 0.7–4.0)
MCH: 33.6 pg (ref 26.0–34.0)
MCHC: 34.7 g/dL (ref 30.0–36.0)
MCV: 96.7 fL (ref 78.0–100.0)
Monocytes Absolute: 0.3 10*3/uL (ref 0.1–1.0)
Monocytes Relative: 8 % (ref 3–12)
Neutro Abs: 2.2 10*3/uL (ref 1.7–7.7)
Neutrophils Relative %: 53 % (ref 43–77)
Platelets: 224 10*3/uL (ref 150–400)
RBC: 4.53 MIL/uL (ref 4.22–5.81)
RDW: 12.6 % (ref 11.5–15.5)
WBC: 4.1 10*3/uL (ref 4.0–10.5)

## 2014-06-20 LAB — HEMOGLOBIN A1C
HEMOGLOBIN A1C: 4.8 % (ref <5.7)
Mean Plasma Glucose: 91 mg/dL (ref <117)

## 2014-06-20 LAB — BILIRUBIN,DIRECT & INDIRECT (FRACTIONATED)
BILIRUBIN INDIRECT: 0.4 mg/dL (ref 0.2–1.2)
Bilirubin, Direct: 0.1 mg/dL (ref 0.0–0.3)

## 2014-06-20 LAB — LIPASE: LIPASE: 29 U/L (ref 0–75)

## 2014-06-20 NOTE — Progress Notes (Signed)
Colin Alexander is here for week 20 A5325 study visit. He completed the study treatment at week 16 and he says that his skin dryness has resolved. He continues to complain of muscle aches, especially in the morning. He has noticed some tingling in his hands and feet also. He will see Dr. Baxter Flattery in a few weeks and his final study appt will be on October 6th.

## 2014-06-24 ENCOUNTER — Other Ambulatory Visit: Payer: Self-pay | Admitting: Internal Medicine

## 2014-06-29 ENCOUNTER — Encounter: Payer: Self-pay | Admitting: Internal Medicine

## 2014-06-29 LAB — CD4/CD8 (T-HELPER/T-SUPPRESSOR CELL)
CD4%: 23.4
CD4: 257
CD8 % Suppressor T Cell: 53.5
CD8: 589

## 2014-06-29 LAB — HIV-1 RNA QUANT-NO REFLEX-BLD: HIV-1 RNA Viral Load: <40

## 2014-07-11 ENCOUNTER — Ambulatory Visit (INDEPENDENT_AMBULATORY_CARE_PROVIDER_SITE_OTHER): Payer: BC Managed Care – PPO | Admitting: Internal Medicine

## 2014-07-11 VITALS — Wt 182.0 lb

## 2014-07-11 DIAGNOSIS — B20 Human immunodeficiency virus [HIV] disease: Secondary | ICD-10-CM

## 2014-07-11 DIAGNOSIS — G589 Mononeuropathy, unspecified: Secondary | ICD-10-CM

## 2014-07-11 DIAGNOSIS — Z113 Encounter for screening for infections with a predominantly sexual mode of transmission: Secondary | ICD-10-CM

## 2014-07-11 DIAGNOSIS — Z23 Encounter for immunization: Secondary | ICD-10-CM

## 2014-07-11 DIAGNOSIS — M5430 Sciatica, unspecified side: Secondary | ICD-10-CM

## 2014-07-11 DIAGNOSIS — G629 Polyneuropathy, unspecified: Secondary | ICD-10-CM

## 2014-07-11 LAB — URINALYSIS, ROUTINE W REFLEX MICROSCOPIC
Bilirubin Urine: NEGATIVE
Glucose, UA: NEGATIVE mg/dL
Hgb urine dipstick: NEGATIVE
Ketones, ur: NEGATIVE mg/dL
LEUKOCYTES UA: NEGATIVE
Nitrite: NEGATIVE
PROTEIN: NEGATIVE mg/dL
Specific Gravity, Urine: 1.02 (ref 1.005–1.030)
UROBILINOGEN UA: 1 mg/dL (ref 0.0–1.0)
pH: 7 (ref 5.0–8.0)

## 2014-07-11 LAB — T4: T4, Total: 6.1 ug/dL (ref 4.5–12.0)

## 2014-07-11 LAB — VITAMIN B12: VITAMIN B 12: 369 pg/mL (ref 211–911)

## 2014-07-11 LAB — TSH: TSH: 0.634 u[IU]/mL (ref 0.350–4.500)

## 2014-07-11 NOTE — Addendum Note (Signed)
Addended byMarlis Edelson on: 07/11/2014 02:04 PM   Modules accepted: Orders

## 2014-07-11 NOTE — Progress Notes (Signed)
[name], male   DOB: Dec 17, 1965, 48 y.o.   MRN: [id number]       [name], male   DOB: Jan 29, 1966, 48 y.o.   MRN: [id number]  HPI  Colin Alexander is a 48yo M with HIV, CD 4 count of 257/VL<20 on truvada/tivicay. He notices having low back pain that his sharp, electric-like, radiating down back of legs. Also occ feels that he has bilateral leg numbness. He is not interested in taking medication for this back pain but wondering what other therapies can be done.  Outpatient Encounter Prescriptions as of 07/11/2014  Medication Sig  . aspirin EC 81 MG tablet Take 81 mg by mouth daily as needed for mild pain.  . Aspirin-Salicylamide-Caffeine (BC HEADACHE POWDER PO) Take 1 packet by mouth daily as needed (pain or headache).  . dolutegravir (TIVICAY) 50 MG tablet Take 1 tablet (50 mg total) by mouth daily.  Marland Kitchen emtricitabine-tenofovir (TRUVADA) 200-300 MG per tablet Take 1 tablet by mouth daily.  . ISOtretinoin (CLARAVIS) 40 MG capsule Take 80 mg by mouth daily.  Marland Kitchen lisinopril-hydrochlorothiazide (PRINZIDE,ZESTORETIC) 20-25 MG per tablet Take 1 tablet by mouth daily.  Marland Kitchen TIVICAY 50 MG tablet TAKE 1 TABLET BY MOUTH DAILY  . TRUVADA 200-300 MG per tablet TAKE 1 TABLET BY MOUTH EVERY DAY  . valACYclovir (VALTREX) 1000 MG tablet TAKE 1 TABLET BY MOUTH DAILY  . valACYclovir (VALTREX) 1000 MG tablet TAKE 1 TABLET BY MOUTH DAILY     Patient Active Problem List   Diagnosis Date Noted  . Bronchitis 10/19/2012  . Atypical chest pain 10/18/2012  . Family history of premature CAD 10/18/2012  . Chest pain 10/17/2012  . HSV (herpes simplex virus) anogenital infection 06/07/2012  . HIV (human immunodeficiency virus infection) 01/13/2012  . HTN (hypertension) 12/31/2011     Health Maintenance Due  Topic Date Due  . Tetanus/tdap  11/27/1983  . Influenza Vaccine  06/10/2014     Review of Systems 10 point ros is negative except for low back pain and bilateral feet  numbness Physical Exam  Wt 182 lb (82.555 kg) Physical Exam  Constitutional: He is oriented to person, place, and time. He appears well-developed and well-nourished. No distress.  HENT:  Mouth/Throat: Oropharynx is clear and moist. No oropharyngeal exudate.  Cardiovascular: Normal rate, regular rhythm and normal heart sounds. Exam reveals no gallop and no friction rub.  No murmur heard.  Pulmonary/Chest: Effort normal and breath sounds normal. No respiratory distress. He has no wheezes.  Abdominal: Soft. Bowel sounds are normal. He exhibits no distension. There is no tenderness.  Lymphadenopathy:  He has no cervical adenopathy.  Neurological: He is alert and oriented to person, place, and time.  Skin: Skin is warm and dry. No rash noted. No erythema.  Psychiatric: He has a normal mood and affect. His behavior is normal.    Lab Results  Component Value Date   CD4TCELL 23* 12/29/2013   Lab Results  Component Value Date   CD4TABS 257 05/23/2014   CD4TABS 191 03/28/2014   CD4TABS 185 01/31/2014   Lab Results  Component Value Date   HIV1RNAQUANT <20 01/17/2014   Lab Results  Component Value Date   HEPBSAB POS* 01/17/2014   No results found for this basename: RPR    CBC Lab Results  Component Value Date   WBC 4.1 06/20/2014   RBC 4.53 06/20/2014   HGB 15.2 06/20/2014   HCT 43.8 06/20/2014   PLT 224 06/20/2014  MCV 96.7 06/20/2014   MCH 33.6 06/20/2014   MCHC 34.7 06/20/2014   RDW 12.6 06/20/2014   LYMPHSABS 1.1 06/20/2014   MONOABS 0.3 06/20/2014   EOSABS 0.4 06/20/2014   BASOSABS 0.1 06/20/2014   BMET Lab Results  Component Value Date   NA 134* 06/20/2014   K 3.2* 06/20/2014   CL 99 06/20/2014   CO2 30 06/20/2014   GLUCOSE 93 06/20/2014   BUN 7 06/20/2014   CREATININE 0.97 06/20/2014   CALCIUM 9.2 06/20/2014   GFRNONAA 84 12/29/2013   GFRAA >89 12/29/2013     Assessment and Plan  hiv = well controlled, continue with truvada/tivicay  Peripheral neuropathy = will check  vitamin b12, tsh  Sciatica = gave handout on stretching exercise  Health maintenance = will check ur gc/chlam.

## 2014-07-11 NOTE — Addendum Note (Signed)
Addended by: Janyce Llanos F on: 07/11/2014 01:55 PM   Modules accepted: Orders

## 2014-08-15 ENCOUNTER — Ambulatory Visit (INDEPENDENT_AMBULATORY_CARE_PROVIDER_SITE_OTHER): Payer: Self-pay | Admitting: *Deleted

## 2014-08-15 VITALS — BP 142/99 | HR 65 | Temp 98.3°F | Wt 187.0 lb

## 2014-08-15 DIAGNOSIS — Z006 Encounter for examination for normal comparison and control in clinical research program: Secondary | ICD-10-CM

## 2014-08-15 DIAGNOSIS — B20 Human immunodeficiency virus [HIV] disease: Secondary | ICD-10-CM

## 2014-08-15 DIAGNOSIS — Z21 Asymptomatic human immunodeficiency virus [HIV] infection status: Secondary | ICD-10-CM

## 2014-08-15 LAB — CD4/CD8 (T-HELPER/T-SUPPRESSOR CELL)
CD4%: 22.3
CD4: 290
CD8 T CELL SUPPRESSOR: 54.4
CD8: 707

## 2014-08-15 NOTE — Progress Notes (Signed)
Colin Alexander was here for the final A5325 study visit. He denies any new problems or concerns. He says his skin is not nearly as dry now as it had been while he was taking the isotretinoin.

## 2014-08-23 LAB — HIV-1 RNA QUANT-NO REFLEX-BLD: HIV-1 RNA Viral Load: <40

## 2014-09-08 ENCOUNTER — Encounter: Payer: Self-pay | Admitting: Internal Medicine

## 2014-09-16 ENCOUNTER — Other Ambulatory Visit: Payer: Self-pay | Admitting: Internal Medicine

## 2014-09-18 ENCOUNTER — Other Ambulatory Visit: Payer: Self-pay | Admitting: *Deleted

## 2014-09-18 DIAGNOSIS — A6000 Herpesviral infection of urogenital system, unspecified: Secondary | ICD-10-CM | POA: Insufficient documentation

## 2014-09-18 DIAGNOSIS — B20 Human immunodeficiency virus [HIV] disease: Secondary | ICD-10-CM

## 2014-09-18 MED ORDER — EMTRICITABINE-TENOFOVIR DF 200-300 MG PO TABS: ORAL_TABLET | ORAL | Status: DC

## 2014-09-18 MED ORDER — DOLUTEGRAVIR SODIUM 50 MG PO TABS: ORAL_TABLET | ORAL | Status: DC

## 2014-09-18 MED ORDER — VALACYCLOVIR HCL 1 G PO TABS: ORAL_TABLET | ORAL | Status: DC

## 2015-01-02 ENCOUNTER — Other Ambulatory Visit: Payer: Self-pay | Admitting: *Deleted

## 2015-01-02 DIAGNOSIS — B20 Human immunodeficiency virus [HIV] disease: Secondary | ICD-10-CM

## 2015-01-02 MED ORDER — DOLUTEGRAVIR SODIUM 50 MG PO TABS: ORAL_TABLET | ORAL | Status: DC

## 2015-01-02 MED ORDER — EMTRICITABINE-TENOFOVIR DF 200-300 MG PO TABS: ORAL_TABLET | ORAL | Status: DC

## 2015-01-02 NOTE — Telephone Encounter (Signed)
ADAP Application 

## 2015-01-17 ENCOUNTER — Other Ambulatory Visit: Payer: Self-pay | Admitting: *Deleted

## 2015-01-17 DIAGNOSIS — Z113 Encounter for screening for infections with a predominantly sexual mode of transmission: Secondary | ICD-10-CM

## 2015-02-03 ENCOUNTER — Other Ambulatory Visit: Payer: Self-pay | Admitting: Internal Medicine

## 2015-05-10 ENCOUNTER — Other Ambulatory Visit: Payer: Self-pay

## 2015-05-15 ENCOUNTER — Other Ambulatory Visit (INDEPENDENT_AMBULATORY_CARE_PROVIDER_SITE_OTHER): Payer: Self-pay

## 2015-05-15 DIAGNOSIS — Z113 Encounter for screening for infections with a predominantly sexual mode of transmission: Secondary | ICD-10-CM

## 2015-05-15 DIAGNOSIS — B20 Human immunodeficiency virus [HIV] disease: Secondary | ICD-10-CM

## 2015-05-15 DIAGNOSIS — Z79899 Other long term (current) drug therapy: Secondary | ICD-10-CM

## 2015-05-15 LAB — CBC WITH DIFFERENTIAL/PLATELET
Basophils Absolute: 0 10*3/uL (ref 0.0–0.1)
Basophils Relative: 1 % (ref 0–1)
Eosinophils Absolute: 0.3 10*3/uL (ref 0.0–0.7)
Eosinophils Relative: 7 % — ABNORMAL HIGH (ref 0–5)
HCT: 45 % (ref 39.0–52.0)
Hemoglobin: 15.4 g/dL (ref 13.0–17.0)
LYMPHS PCT: 29 % (ref 12–46)
Lymphs Abs: 1.3 10*3/uL (ref 0.7–4.0)
MCH: 33.4 pg (ref 26.0–34.0)
MCHC: 34.2 g/dL (ref 30.0–36.0)
MCV: 97.6 fL (ref 78.0–100.0)
MPV: 10.7 fL (ref 8.6–12.4)
Monocytes Absolute: 0.3 10*3/uL (ref 0.1–1.0)
Monocytes Relative: 7 % (ref 3–12)
Neutro Abs: 2.5 10*3/uL (ref 1.7–7.7)
Neutrophils Relative %: 56 % (ref 43–77)
PLATELETS: 251 10*3/uL (ref 150–400)
RBC: 4.61 MIL/uL (ref 4.22–5.81)
RDW: 12.6 % (ref 11.5–15.5)
WBC: 4.5 10*3/uL (ref 4.0–10.5)

## 2015-05-15 LAB — LIPID PANEL
CHOL/HDL RATIO: 3.7 ratio
CHOLESTEROL: 137 mg/dL (ref 0–200)
HDL: 37 mg/dL — ABNORMAL LOW (ref >=40)
LDL Cholesterol: 82 mg/dL (ref 0–99)
TRIGLYCERIDES: 92 mg/dL (ref <150)
VLDL: 18 mg/dL (ref 0–40)

## 2015-05-15 LAB — COMPLETE METABOLIC PANEL WITH GFR
ALT: 26 U/L (ref 0–53)
AST: 17 U/L (ref 0–37)
Albumin: 4.1 g/dL (ref 3.5–5.2)
Alkaline Phosphatase: 42 U/L (ref 39–117)
BUN: 10 mg/dL (ref 6–23)
CO2: 26 meq/L (ref 19–32)
CREATININE: 0.85 mg/dL (ref 0.50–1.35)
Calcium: 9.1 mg/dL (ref 8.4–10.5)
Chloride: 103 mEq/L (ref 96–112)
GFR, Est African American: >89 mL/min
GFR, Est Non African American: >89 mL/min
GLUCOSE: 80 mg/dL (ref 70–99)
Potassium: 3.5 mEq/L (ref 3.5–5.3)
SODIUM: 141 meq/L (ref 135–145)
TOTAL PROTEIN: 6.9 g/dL (ref 6.0–8.3)
Total Bilirubin: 0.6 mg/dL (ref 0.2–1.2)

## 2015-05-16 LAB — HIV-1 RNA QUANT-NO REFLEX-BLD
HIV 1 RNA Quant: <20 copies/mL (ref <20)
HIV-1 RNA Quant, Log: <1.3 {Log} (ref <1.30)

## 2015-05-16 LAB — RPR

## 2015-05-16 LAB — T-HELPER CELL (CD4) - (RCID CLINIC ONLY)
CD4 % Helper T Cell: 26 % — ABNORMAL LOW (ref 33–55)
CD4 T CELL ABS: 340 /uL — AB (ref 400–2700)

## 2015-05-20 ENCOUNTER — Other Ambulatory Visit: Payer: Self-pay | Admitting: Internal Medicine

## 2015-05-22 LAB — HLA B*5701: HLA-B*5701 w/rflx HLA-B High: NEGATIVE

## 2015-05-23 ENCOUNTER — Ambulatory Visit: Payer: Self-pay | Admitting: Internal Medicine

## 2015-05-31 ENCOUNTER — Encounter: Payer: Self-pay | Admitting: Internal Medicine

## 2015-05-31 ENCOUNTER — Ambulatory Visit (INDEPENDENT_AMBULATORY_CARE_PROVIDER_SITE_OTHER): Payer: Self-pay | Admitting: Internal Medicine

## 2015-05-31 ENCOUNTER — Ambulatory Visit: Payer: Self-pay

## 2015-05-31 VITALS — BP 141/101 | HR 97 | Temp 98.7°F | Wt 195.0 lb

## 2015-05-31 DIAGNOSIS — I10 Essential (primary) hypertension: Secondary | ICD-10-CM

## 2015-05-31 DIAGNOSIS — Z8619 Personal history of other infectious and parasitic diseases: Secondary | ICD-10-CM

## 2015-05-31 DIAGNOSIS — Z23 Encounter for immunization: Secondary | ICD-10-CM

## 2015-05-31 DIAGNOSIS — B20 Human immunodeficiency virus [HIV] disease: Secondary | ICD-10-CM

## 2015-05-31 MED ORDER — ELVITEG-COBIC-EMTRICIT-TENOFAF 150-150-200-10 MG PO TABS
1.0000 | ORAL_TABLET | Freq: Every day | ORAL | Status: DC
Start: 2015-05-31 — End: 2016-06-14

## 2015-05-31 MED ORDER — HYDROCHLOROTHIAZIDE 25 MG PO TABS
25.0000 mg | ORAL_TABLET | Freq: Every day | ORAL | Status: DC
Start: 2015-05-31 — End: 2016-06-19

## 2015-05-31 MED ORDER — LISINOPRIL 40 MG PO TABS: 40.0000 mg | ORAL_TABLET | Freq: Every day | ORAL | Status: DC

## 2015-05-31 NOTE — Progress Notes (Signed)
[name], male   DOB: Mar 09, 1966, 49 y.o.   MRN: [id number]       [name], male   DOB: 12/07/1965, 49 y.o.   MRN: [id number]  HPI 49 yo Male with HIV disease, cd4 count 340/VL<20 on tivicay, and truvada. Doing well with med adherence. He states that he has been in good state of health. 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   Outpatient Encounter Prescriptions as of 05/31/2015  Medication Sig  . aspirin EC 81 MG tablet Take 81 mg by mouth daily as needed for mild pain.  . Aspirin-Salicylamide-Caffeine (BC HEADACHE POWDER PO) Take 1 packet by mouth daily as needed (pain or headache).  Marland Kitchen emtricitabine-tenofovir (TRUVADA) 200-300 MG per tablet TAKE 1 TABLET BY MOUTH EVERY DAY  . ISOtretinoin (CLARAVIS) 40 MG capsule Take 80 mg by mouth daily.  Marland Kitchen lisinopril-hydrochlorothiazide (PRINZIDE,ZESTORETIC) 20-25 MG per tablet TAKE 1 TABLET BY MOUTH DAILY  . TIVICAY 50 MG tablet TAKE 1 TABLET BY MOUTH DAILY  . valACYclovir (VALTREX) 1000 MG tablet TAKE 1 TABLET BY MOUTH DAILY  . [DISCONTINUED] valACYclovir (VALTREX) 1000 MG tablet Take 1 tablet (1,000 mg total) by mouth daily.   No facility-administered encounter medications on file as of 05/31/2015.     Patient Active Problem List   Diagnosis Date Noted  . Herpes genitalis   . Bronchitis 10/19/2012  . Atypical chest pain 10/18/2012  . Family history of premature CAD 10/18/2012  . Chest pain 10/17/2012  . HSV (herpes simplex virus) anogenital infection 06/07/2012  . HIV (human immunodeficiency virus infection) 01/13/2012  . HTN (hypertension) 12/31/2011     Health Maintenance Due  Topic Date Due  . TETANUS/TDAP  11/27/1983  . COLONOSCOPY  11/26/2014     Review of Systems 10 point ros is negative Physical Exam   BP 141/101 mmHg  Pulse 97  Temp(Src) 98.7 F (37.1 C) (Oral)  Wt 195 lb (88.451 kg) Physical Exam  Constitutional: He is  oriented to person, place, and time. He appears well-developed and well-nourished. No distress.  HENT:  Mouth/Throat: Oropharynx is clear and moist. No oropharyngeal exudate.  Cardiovascular: Normal rate, regular rhythm and normal heart sounds. Exam reveals no gallop and no friction rub.  No murmur heard.  Pulmonary/Chest: Effort normal and breath sounds normal. No respiratory distress. He has no wheezes.  Abdominal: Soft. Bowel sounds are normal. He exhibits no distension. There is no tenderness.  Lymphadenopathy:  He has no cervical adenopathy.  Neurological: He is alert and oriented to person, place, and time.  Skin: Skin is warm and dry. No rash noted. No erythema.  Psychiatric: He has a normal mood and affect. His behavior is normal.    Lab Results  Component Value Date   CD4TCELL 26* 05/15/2015   Lab Results  Component Value Date   CD4TABS 340* 05/15/2015   CD4TABS 290 08/15/2014   CD4TABS 257 05/23/2014   Lab Results  Component Value Date   HIV1RNAQUANT <20 05/15/2015   Lab Results  Component Value Date   HEPBSAB POS* 01/17/2014   No results found for: RPR  CBC Lab Results  Component Value Date   WBC 4.5 05/15/2015   RBC 4.61 05/15/2015   HGB 15.4 05/15/2015   HCT 45.0 05/15/2015   PLT 251 05/15/2015   MCV 97.6 05/15/2015   MCH 33.4 05/15/2015   MCHC 34.2 05/15/2015   RDW 12.6 05/15/2015  LYMPHSABS 1.3 05/15/2015   MONOABS 0.3 05/15/2015   EOSABS 0.3 05/15/2015   BASOSABS 0.0 05/15/2015   BMET Lab Results  Component Value Date   NA 141 05/15/2015   K 3.5 05/15/2015   CL 103 05/15/2015   CO2 26 05/15/2015   GLUCOSE 80 05/15/2015   BUN 10 05/15/2015   CREATININE 0.85 05/15/2015   CALCIUM 9.1 05/15/2015   GFRNONAA >89 05/15/2015   GFRAA >89 05/15/2015     Assessment and Plan  Hypertension, uncontrolled =wil change bp regimen to lisinopril 40 and hctz 25  Health maintenance = will give him vaccine with tetanus, check ppd for work  purposes  hiv disease = Will change him to genvoya  Hx of hsv infection = continue with valtrex suppression

## 2015-06-04 ENCOUNTER — Ambulatory Visit (INDEPENDENT_AMBULATORY_CARE_PROVIDER_SITE_OTHER): Payer: Self-pay | Admitting: *Deleted

## 2015-06-04 DIAGNOSIS — B20 Human immunodeficiency virus [HIV] disease: Secondary | ICD-10-CM

## 2015-06-06 LAB — TB SKIN TEST
Induration: <0 mm
TB Skin Test: NEGATIVE

## 2015-06-08 NOTE — Addendum Note (Signed)
Addended by: Reggy Eye on: 06/08/2015 09:38 AM   Modules accepted: Orders

## 2015-06-12 ENCOUNTER — Encounter: Payer: Self-pay | Admitting: *Deleted

## 2015-07-18 NOTE — Progress Notes (Signed)
Notified [facility] by fax. Azari Janssens M, RN 

## 2015-09-04 ENCOUNTER — Encounter: Payer: Self-pay | Admitting: Internal Medicine

## 2015-09-04 ENCOUNTER — Ambulatory Visit (INDEPENDENT_AMBULATORY_CARE_PROVIDER_SITE_OTHER): Payer: Self-pay | Admitting: Internal Medicine

## 2015-09-04 VITALS — BP 145/95 | HR 87 | Temp 98.4°F | Wt 193.0 lb

## 2015-09-04 DIAGNOSIS — Z23 Encounter for immunization: Secondary | ICD-10-CM

## 2015-09-04 DIAGNOSIS — Z8619 Personal history of other infectious and parasitic diseases: Secondary | ICD-10-CM

## 2015-09-04 DIAGNOSIS — I10 Essential (primary) hypertension: Secondary | ICD-10-CM

## 2015-09-04 DIAGNOSIS — B20 Human immunodeficiency virus [HIV] disease: Secondary | ICD-10-CM

## 2015-09-04 DIAGNOSIS — Z113 Encounter for screening for infections with a predominantly sexual mode of transmission: Secondary | ICD-10-CM

## 2015-09-04 NOTE — Progress Notes (Signed)
[name], male   DOB: Apr 18, 1966, 49 y.o.   MRN: [id number]       [name], male   DOB: [birth year], 49 y.o.   MRN: [id number]  HPI  49yo M with HIV disease, CD 4 count of 340/VL<20. On genvoya. Outpatient Encounter Prescriptions as of 09/04/2015  Medication Sig  . aspirin EC 81 MG tablet Take 81 mg by mouth daily as needed for mild pain.  . Aspirin-Salicylamide-Caffeine (BC HEADACHE POWDER PO) Take 1 packet by mouth daily as needed (pain or headache).  . elvitegravir-cobicistat-emtricitabine-tenofovir (GENVOYA) 150-150-200-10 MG TABS tablet Take 1 tablet by mouth daily with breakfast.  . hydrochlorothiazide (HYDRODIURIL) 25 MG tablet Take 1 tablet (25 mg total) by mouth daily.  . ISOtretinoin (CLARAVIS) 40 MG capsule Take 80 mg by mouth daily.  Marland Kitchen lisinopril (PRINIVIL,ZESTRIL) 40 MG tablet Take 1 tablet (40 mg total) by mouth daily.  . valACYclovir (VALTREX) 1000 MG tablet TAKE 1 TABLET BY MOUTH DAILY   No facility-administered encounter medications on file as of 09/04/2015.     Patient Active Problem List   Diagnosis Date Noted  . Herpes genitalis   . Bronchitis 10/19/2012  . Atypical chest pain 10/18/2012  . Family history of premature CAD 10/18/2012  . Chest pain 10/17/2012  . HSV (herpes simplex virus) anogenital infection 06/07/2012  . HIV (human immunodeficiency virus infection) ([place]) 01/13/2012  . HTN (hypertension) 12/31/2011     Health Maintenance Due  Topic Date Due  . COLONOSCOPY  11/26/2014  . INFLUENZA VACCINE  06/11/2015     Review of Systems  Physical Exam   BP 145/95 mmHg  Pulse 87  Temp(Src) 98.4 F (36.9 C) (Oral)  Wt 193 lb (87.544 kg)  Lab Results  Component Value Date   CD4TCELL 26* 05/15/2015   Lab Results  Component Value Date   CD4TABS 340* 05/15/2015   CD4TABS 290 08/15/2014   CD4TABS 257 05/23/2014   Lab Results  Component Value Date   HIV1RNAQUANT <20 05/15/2015   Lab Results  Component Value  Date   HEPBSAB POS* 01/17/2014   No results found for: RPR  CBC Lab Results  Component Value Date   WBC 4.5 05/15/2015   RBC 4.61 05/15/2015   HGB 15.4 05/15/2015   HCT 45.0 05/15/2015   PLT 251 05/15/2015   MCV 97.6 05/15/2015   MCH 33.4 05/15/2015   MCHC 34.2 05/15/2015   RDW 12.6 05/15/2015   LYMPHSABS 1.3 05/15/2015   MONOABS 0.3 05/15/2015   EOSABS 0.3 05/15/2015   BASOSABS 0.0 05/15/2015   BMET Lab Results  Component Value Date   NA 141 05/15/2015   K 3.5 05/15/2015   CL 103 05/15/2015   CO2 26 05/15/2015   GLUCOSE 80 05/15/2015   BUN 10 05/15/2015   CREATININE 0.85 05/15/2015   CALCIUM 9.1 05/15/2015   GFRNONAA >89 05/15/2015   GFRAA >89 05/15/2015     Assessment and Plan   hiv disease = continue on genvoya. Will check cd 4 count and viral load, and rpr  Htn, poorly controlled = he did not take his meds today that would account for his bp elevated on visit. Asked him to keep bp diary to see if need to titrate meds  Health maintenance = flu vaccine today  hsv  -= continue on valtrex for prophylaxis

## 2015-09-05 LAB — HIV-1 RNA QUANT-NO REFLEX-BLD: HIV 1 RNA Quant: <20 copies/mL (ref <20)

## 2015-09-05 LAB — RPR

## 2015-09-06 LAB — T-HELPER CELL (CD4) - (RCID CLINIC ONLY)
CD4 % Helper T Cell: 25 % — ABNORMAL LOW (ref 33–55)
CD4 T Cell Abs: 290 /uL — ABNORMAL LOW (ref 400–2700)

## 2015-12-06 ENCOUNTER — Encounter: Payer: Self-pay | Admitting: Internal Medicine

## 2015-12-06 ENCOUNTER — Ambulatory Visit (INDEPENDENT_AMBULATORY_CARE_PROVIDER_SITE_OTHER): Payer: Self-pay | Admitting: Internal Medicine

## 2015-12-06 VITALS — BP 135/94 | HR 89 | Temp 98.0°F | Ht 68.0 in | Wt 194.2 lb

## 2015-12-06 DIAGNOSIS — Z113 Encounter for screening for infections with a predominantly sexual mode of transmission: Secondary | ICD-10-CM

## 2015-12-06 DIAGNOSIS — B20 Human immunodeficiency virus [HIV] disease: Secondary | ICD-10-CM

## 2015-12-06 DIAGNOSIS — N50812 Left testicular pain: Secondary | ICD-10-CM

## 2015-12-06 DIAGNOSIS — G44009 Cluster headache syndrome, unspecified, not intractable: Secondary | ICD-10-CM

## 2015-12-06 NOTE — Progress Notes (Signed)
[name], male   DOB: Apr 18, 1966, 50 y.o.   MRN: [id number]       [name], male   DOB: 1966-10-11, 50 y.o.   MRN: [id number]  HPI Grasyn is a 50yo M with HIV disease, HTN, CD 4 count of 290/VL<20 in Oct 2016. Currently on genvoya He is doing well with adherence. He mentions that he has noticed intermittent left testicular pain, at base of scrotum. He denies any urethral discharge. He last had anal sex around November, with use of condoms. Also has intermittent headache in which he wakes up in the middle of the night with a headache though it disappears by morning time.  Outpatient Encounter Prescriptions as of 12/06/2015  Medication Sig  . elvitegravir-cobicistat-emtricitabine-tenofovir (GENVOYA) 150-150-200-10 MG TABS tablet Take 1 tablet by mouth daily with breakfast.  . hydrochlorothiazide (HYDRODIURIL) 25 MG tablet Take 1 tablet (25 mg total) by mouth daily.  . ISOtretinoin (CLARAVIS) 40 MG capsule Take 80 mg by mouth daily.  Marland Kitchen lisinopril (PRINIVIL,ZESTRIL) 40 MG tablet Take 1 tablet (40 mg total) by mouth daily.  . valACYclovir (VALTREX) 1000 MG tablet TAKE 1 TABLET BY MOUTH DAILY  . aspirin EC 81 MG tablet Take 81 mg by mouth daily as needed for mild pain. Reported on 12/06/2015  . Aspirin-Salicylamide-Caffeine (BC HEADACHE POWDER PO) Take 1 packet by mouth daily as needed (pain or headache). Reported on 12/06/2015   No facility-administered encounter medications on file as of 12/06/2015.     Patient Active Problem List   Diagnosis Date Noted  . Herpes genitalis   . Bronchitis 10/19/2012  . Atypical chest pain 10/18/2012  . Family history of premature CAD 10/18/2012  . Chest pain 10/17/2012  . HSV (herpes simplex virus) anogenital infection 06/07/2012  . HIV (human immunodeficiency virus infection) ([place]) 01/13/2012  . HTN (hypertension) 12/31/2011     Health Maintenance Due  Topic Date Due  . COLONOSCOPY  11/26/2014     Review of  Systems 10 point ros is negative except what is mentioned ni hpi Physical Exam   BP 135/94 mmHg  Pulse 89  Temp(Src) 98 F (36.7 C) (Oral)  Ht 5\' 8"  (1.727 m)  Wt 194 lb 4 oz (88.111 kg)  BMI 29.54 kg/m2 Physical Exam  Constitutional: He is oriented to person, place, and time. He appears well-developed and well-nourished. No distress.  HENT:  Mouth/Throat: Oropharynx is clear and moist. No oropharyngeal exudate.  Cardiovascular: Normal rate, regular rhythm and normal heart sounds. Exam reveals no gallop and no friction rub.  No murmur heard.  Pulmonary/Chest: Effort normal and breath sounds normal. No respiratory distress. He has no wheezes.  Abdominal: Soft. Bowel sounds are normal. He exhibits no distension. There is no tenderness.  GU = no erythema, swelling of testicles.no rash  Lymphadenopathy:  He has no cervical adenopathy.  Neurological: He is alert and oriented to person, place, and time.  Skin: Skin is warm and dry. No rash noted. No erythema.  Psychiatric: He has a normal mood and affect. His behavior is normal.    Lab Results  Component Value Date   CD4TCELL 25* 09/04/2015   Lab Results  Component Value Date   CD4TABS 290* 09/04/2015   CD4TABS 340* 05/15/2015   CD4TABS 290 08/15/2014   Lab Results  Component Value Date   HIV1RNAQUANT <20 09/04/2015   Lab Results  Component Value Date   HEPBSAB POS* 01/17/2014   No results found for: RPR  CBC  Lab Results  Component Value Date   WBC 4.5 05/15/2015   RBC 4.61 05/15/2015   HGB 15.4 05/15/2015   HCT 45.0 05/15/2015   PLT 251 05/15/2015   MCV 97.6 05/15/2015   MCH 33.4 05/15/2015   MCHC 34.2 05/15/2015   RDW 12.6 05/15/2015   LYMPHSABS 1.3 05/15/2015   MONOABS 0.3 05/15/2015   EOSABS 0.3 05/15/2015   BASOSABS 0.0 05/15/2015   BMET Lab Results  Component Value Date   NA 141 05/15/2015   K 3.5 05/15/2015   CL 103 05/15/2015   CO2 26 05/15/2015   GLUCOSE 80 05/15/2015   BUN 10 05/15/2015    CREATININE 0.85 05/15/2015   CALCIUM 9.1 05/15/2015   GFRNONAA >89 05/15/2015   GFRAA >89 05/15/2015     Assessment and Plan hiv disease = well controlled, continue with current regimen. Recheck labs in 3 months  Testicular pain = will check gc/chlam ur and other sti screening. Will get u/s testicles to rule out structural issues Will do sti screening  Headache = try to see if increasing fluid intake improves, symptoms. If still happening after that will do, work up. Currently does not appear to be caffeine withdrawal or NSAID rebound headache. No new stressors/anxiety

## 2015-12-07 LAB — RPR

## 2015-12-07 LAB — CYTOLOGY, (ORAL, ANAL, URETHRAL) ANCILLARY ONLY
CHLAMYDIA, DNA PROBE: NEGATIVE
CHLAMYDIA, DNA PROBE: NEGATIVE
NEISSERIA GONORRHEA: NEGATIVE
Neisseria Gonorrhea: NEGATIVE

## 2015-12-07 LAB — URINE CYTOLOGY ANCILLARY ONLY
Chlamydia: NEGATIVE
Neisseria Gonorrhea: NEGATIVE

## 2016-02-22 ENCOUNTER — Other Ambulatory Visit: Payer: Self-pay | Admitting: Internal Medicine

## 2016-03-12 ENCOUNTER — Other Ambulatory Visit (INDEPENDENT_AMBULATORY_CARE_PROVIDER_SITE_OTHER): Payer: Self-pay

## 2016-03-12 DIAGNOSIS — B20 Human immunodeficiency virus [HIV] disease: Secondary | ICD-10-CM

## 2016-03-12 LAB — CBC WITH DIFFERENTIAL/PLATELET
BASOS ABS: 52 {cells}/uL (ref 0–200)
Basophils Relative: 1 %
EOS PCT: 6 %
Eosinophils Absolute: 312 cells/uL (ref 15–500)
HCT: 45.5 % (ref 38.5–50.0)
HEMOGLOBIN: 15.9 g/dL (ref 13.2–17.1)
LYMPHS ABS: 1456 {cells}/uL (ref 850–3900)
Lymphocytes Relative: 28 %
MCH: 34 pg — ABNORMAL HIGH (ref 27.0–33.0)
MCHC: 34.9 g/dL (ref 32.0–36.0)
MCV: 97.4 fL (ref 80.0–100.0)
MONOS PCT: 10 %
MPV: 10.3 fL (ref 7.5–12.5)
Monocytes Absolute: 520 cells/uL (ref 200–950)
NEUTROS PCT: 55 %
Neutro Abs: 2860 cells/uL (ref 1500–7800)
PLATELETS: 221 10*3/uL (ref 140–400)
RBC: 4.67 MIL/uL (ref 4.20–5.80)
RDW: 12.4 % (ref 11.0–15.0)
WBC: 5.2 10*3/uL (ref 3.8–10.8)

## 2016-03-12 LAB — COMPLETE METABOLIC PANEL WITH GFR
ALBUMIN: 4.4 g/dL (ref 3.6–5.1)
ALK PHOS: 44 U/L (ref 40–115)
ALT: 24 U/L (ref 9–46)
AST: 17 U/L (ref 10–35)
BILIRUBIN TOTAL: 0.7 mg/dL (ref 0.2–1.2)
BUN: 12 mg/dL (ref 7–25)
CO2: 27 mmol/L (ref 20–31)
CREATININE: 1 mg/dL (ref 0.70–1.33)
Calcium: 9.7 mg/dL (ref 8.6–10.3)
Chloride: 104 mmol/L (ref 98–110)
GFR, Est African American: >89 mL/min (ref >=60)
GFR, Est Non African American: 87 mL/min (ref >=60)
Glucose, Bld: 77 mg/dL (ref 65–99)
Potassium: 3.7 mmol/L (ref 3.5–5.3)
SODIUM: 141 mmol/L (ref 135–146)
Total Protein: 7.3 g/dL (ref 6.1–8.1)

## 2016-03-13 LAB — HIV-1 RNA QUANT-NO REFLEX-BLD

## 2016-03-13 LAB — T-HELPER CELL (CD4) - (RCID CLINIC ONLY)
CD4 % Helper T Cell: 26 % — ABNORMAL LOW (ref 33–55)
CD4 T Cell Abs: 430 /uL (ref 400–2700)

## 2016-03-26 ENCOUNTER — Encounter: Payer: Self-pay | Admitting: Internal Medicine

## 2016-04-10 ENCOUNTER — Encounter: Payer: Self-pay | Admitting: Internal Medicine

## 2016-04-10 ENCOUNTER — Ambulatory Visit (INDEPENDENT_AMBULATORY_CARE_PROVIDER_SITE_OTHER): Payer: Self-pay | Admitting: Internal Medicine

## 2016-04-10 VITALS — BP 152/92 | HR 84 | Temp 98.1°F | Ht 68.0 in | Wt 197.0 lb

## 2016-04-10 DIAGNOSIS — I10 Essential (primary) hypertension: Secondary | ICD-10-CM

## 2016-04-10 DIAGNOSIS — B20 Human immunodeficiency virus [HIV] disease: Secondary | ICD-10-CM

## 2016-04-10 DIAGNOSIS — G44229 Chronic tension-type headache, not intractable: Secondary | ICD-10-CM

## 2016-04-10 DIAGNOSIS — J302 Other seasonal allergic rhinitis: Secondary | ICD-10-CM

## 2016-04-10 MED ORDER — CETIRIZINE HCL 10 MG PO TABS: 10.0000 mg | ORAL_TABLET | Freq: Every day | ORAL | Status: DC

## 2016-04-10 MED ORDER — AMITRIPTYLINE HCL 25 MG PO TABS: 25.0000 mg | ORAL_TABLET | Freq: Every day | ORAL | Status: DC

## 2016-04-10 NOTE — Progress Notes (Signed)
[name], male   DOB: [birth year], 50 y.o.   MRN: [id number]       [name], male   DOB: 1966-10-01, 50 y.o.   MRN: [id number]  HPI 50yo M with well controlled hiv disease, htn, CD 4 count of 430/VL<20 on genvoya but also reports to having chronic daily headache not responding to bc powder, which he takes frequently. Denies visual distrubance, sound/light making his headaches worse.  Outpatient Encounter Prescriptions as of 04/10/2016  Medication Sig  . Aspirin-Salicylamide-Caffeine (BC HEADACHE POWDER PO) Take 1 packet by mouth daily as needed (pain or headache). Reported on 12/06/2015  . elvitegravir-cobicistat-emtricitabine-tenofovir (GENVOYA) 150-150-200-10 MG TABS tablet Take 1 tablet by mouth daily with breakfast.  . hydrochlorothiazide (HYDRODIURIL) 25 MG tablet Take 1 tablet (25 mg total) by mouth daily.  Marland Kitchen lisinopril (PRINIVIL,ZESTRIL) 40 MG tablet Take 1 tablet (40 mg total) by mouth daily.  Marland Kitchen TIVICAY 50 MG tablet TAKE 1 TABLET BY MOUTH DAILY  . valACYclovir (VALTREX) 1000 MG tablet TAKE 1 TABLET BY MOUTH DAILY  . [DISCONTINUED] aspirin EC 81 MG tablet Take 81 mg by mouth daily as needed for mild pain. Reported on 04/10/2016  . [DISCONTINUED] ISOtretinoin (CLARAVIS) 40 MG capsule Take 80 mg by mouth daily. Reported on 04/10/2016   No facility-administered encounter medications on file as of 04/10/2016.     Patient Active Problem List   Diagnosis Date Noted  . Herpes genitalis   . Bronchitis 10/19/2012  . Atypical chest pain 10/18/2012  . Family history of premature CAD 10/18/2012  . Chest pain 10/17/2012  . HSV (herpes simplex virus) anogenital infection 06/07/2012  . HIV (human immunodeficiency virus infection) ([place]) 01/13/2012  . HTN (hypertension) 12/31/2011     Health Maintenance Due  Topic Date Due  . COLONOSCOPY  11/26/2014     Review of Systems Per hpi, daily headache, otherwise 10 point ros is negative Physical Exam   BP 152/92  mmHg  Pulse 84  Temp(Src) 98.1 F (36.7 C) (Oral)  Ht 5\' 8"  (1.727 m)  Wt 197 lb (89.359 kg)  BMI 29.96 kg/m2 Physical Exam  Constitutional: He is oriented to person, place, and time. He appears well-developed and well-nourished. No distress.  HENT:  Mouth/Throat: Oropharynx is clear and moist. No oropharyngeal exudate.  Cardiovascular: Normal rate, regular rhythm and normal heart sounds. Exam reveals no gallop and no friction rub.  No murmur heard.  Pulmonary/Chest: Effort normal and breath sounds normal. No respiratory distress. He has no wheezes.  Abdominal: Soft. Bowel sounds are normal. He exhibits no distension. There is no tenderness.  Lymphadenopathy:  He has no cervical adenopathy.  Neurological: He is alert and oriented to person, place, and time.  Skin: Skin is warm and dry. No rash noted. No erythema.  Psychiatric: He has a normal mood and affect. His behavior is normal.    Lab Results  Component Value Date   CD4TCELL 26* 03/12/2016   Lab Results  Component Value Date   CD4TABS 430 03/12/2016   CD4TABS 290* 09/04/2015   CD4TABS 340* 05/15/2015   Lab Results  Component Value Date   HIV1RNAQUANT <20 03/12/2016   Lab Results  Component Value Date   HEPBSAB POS* 01/17/2014   No results found for: RPR  CBC Lab Results  Component Value Date   WBC 5.2 03/12/2016   RBC 4.67 03/12/2016   HGB 15.9 03/12/2016   HCT 45.5 03/12/2016   PLT 221 03/12/2016   MCV 97.4  03/12/2016   MCH 34.0* 03/12/2016   MCHC 34.9 03/12/2016   RDW 12.4 03/12/2016   LYMPHSABS 1456 03/12/2016   MONOABS 520 03/12/2016   EOSABS 312 03/12/2016   BASOSABS 52 03/12/2016   BMET Lab Results  Component Value Date   NA 141 03/12/2016   K 3.7 03/12/2016   CL 104 03/12/2016   CO2 27 03/12/2016   GLUCOSE 77 03/12/2016   BUN 12 03/12/2016   CREATININE 1.00 03/12/2016   CALCIUM 9.7 03/12/2016   GFRNONAA 87 03/12/2016   GFRAA >89 03/12/2016     Assessment and Plan   hiv disease  = well controlled. Continue with current regimen  htn = elevated at this visit, but did not take meds yet per his report  Chronic headache = will have him start amiltryptiline qhs to see ifi it helps his symptoms. Still may need something for rescue  Seasonal allergies = will give mucinex nad zyrtec prn to help with symptoms management

## 2016-05-22 ENCOUNTER — Ambulatory Visit (INDEPENDENT_AMBULATORY_CARE_PROVIDER_SITE_OTHER): Payer: Self-pay | Admitting: Internal Medicine

## 2016-05-22 ENCOUNTER — Encounter: Payer: Self-pay | Admitting: Internal Medicine

## 2016-05-22 VITALS — BP 148/104 | HR 76 | Temp 98.6°F | Wt 192.0 lb

## 2016-05-22 DIAGNOSIS — I10 Essential (primary) hypertension: Secondary | ICD-10-CM

## 2016-05-22 DIAGNOSIS — Z113 Encounter for screening for infections with a predominantly sexual mode of transmission: Secondary | ICD-10-CM

## 2016-05-22 DIAGNOSIS — G44229 Chronic tension-type headache, not intractable: Secondary | ICD-10-CM

## 2016-05-22 DIAGNOSIS — B20 Human immunodeficiency virus [HIV] disease: Secondary | ICD-10-CM

## 2016-05-22 DIAGNOSIS — Z0000 Encounter for general adult medical examination without abnormal findings: Secondary | ICD-10-CM

## 2016-05-22 NOTE — Progress Notes (Signed)
[name], male   DOB: December 24, 1965, 50 y.o.   MRN: [id number]       [name], male   DOB: September 30, 1966, 50 y.o.   MRN: [id number]  HPI Colin Alexander is a 50yo M with HIV disease, CD 4 count 430/VL<20, currently on genvoya. When we saw him at last visit, he complained of chronic headache. We started him on elavil for which he reports that has helped considerably.   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   He has had one sexual encounter, used condoms. He was the receptive partner.  Outpatient Encounter Prescriptions as of 05/22/2016  Medication Sig  . amitriptyline (ELAVIL) 25 MG tablet Take 1 tablet (25 mg total) by mouth at bedtime. Start with 1/2 tab at bedtime x 8 days, then increase to 1 tab at night  . Aspirin-Salicylamide-Caffeine (BC HEADACHE POWDER PO) Take 1 packet by mouth daily as needed (pain or headache). Reported on 12/06/2015  . cetirizine (ZYRTEC) 10 MG tablet Take 1 tablet (10 mg total) by mouth daily.  Marland Kitchen elvitegravir-cobicistat-emtricitabine-tenofovir (GENVOYA) 150-150-200-10 MG TABS tablet Take 1 tablet by mouth daily with breakfast.  . hydrochlorothiazide (HYDRODIURIL) 25 MG tablet Take 1 tablet (25 mg total) by mouth daily.  Marland Kitchen lisinopril (PRINIVIL,ZESTRIL) 40 MG tablet Take 1 tablet (40 mg total) by mouth daily.  Marland Kitchen TIVICAY 50 MG tablet TAKE 1 TABLET BY MOUTH DAILY  . valACYclovir (VALTREX) 1000 MG tablet TAKE 1 TABLET BY MOUTH DAILY   No facility-administered encounter medications on file as of 05/22/2016.     Patient Active Problem List   Diagnosis Date Noted  . Herpes genitalis   . Bronchitis 10/19/2012  . Atypical chest pain 10/18/2012  . Family history of premature CAD 10/18/2012  . Chest pain 10/17/2012  . HSV (herpes simplex virus) anogenital infection 06/07/2012  . HIV (human immunodeficiency virus infection) ([place]) 01/13/2012  . HTN (hypertension) 12/31/2011     Health  Maintenance Due  Topic Date Due  . COLONOSCOPY  11/26/2014     Review of Systems   Constitutional: Negative for fever, chills, diaphoresis, activity change, appetite change, fatigue and unexpected weight change.  HENT: Negative for congestion, sore throat, rhinorrhea, sneezing, trouble swallowing and sinus pressure.  Eyes: Negative for photophobia and visual disturbance.  Respiratory: Negative for cough, chest tightness, shortness of breath, wheezing and stridor.  Cardiovascular: Negative for chest pain, palpitations and leg swelling.  Gastrointestinal: Negative for nausea, vomiting, abdominal pain, diarrhea, constipation, blood in stool, abdominal distention and anal bleeding.  Genitourinary: Negative for dysuria, hematuria, flank pain and difficulty urinating.  Musculoskeletal: Negative for myalgias, back pain, joint swelling, arthralgias and gait problem.  Skin: Negative for color change, pallor, rash and wound.  Neurological: Negative for dizziness, tremors, weakness and light-headedness.  Hematological: Negative for adenopathy. Does not bruise/bleed easily.  Psychiatric/Behavioral: Negative for behavioral problems, confusion, sleep disturbance, dysphoric mood, decreased concentration and agitation.    Physical Exam   BP 148/104 mmHg  Pulse 76  Temp(Src) 98.6 F (37 C) (Oral)  Wt 192 lb (87.091 kg) Physical Exam  Constitutional: He is oriented to person, place, and time. He appears well-developed and well-nourished. No distress.  HENT:  Mouth/Throat: Oropharynx is clear and moist. No oropharyngeal exudate.  Cardiovascular: Normal rate, regular rhythm and normal heart sounds. Exam reveals no gallop and no friction rub.  No murmur heard.  Pulmonary/Chest: Effort normal and breath sounds normal.  No respiratory distress. He has no wheezes.  Abdominal: Soft. Bowel sounds are normal. He exhibits no distension. There is no tenderness.  Lymphadenopathy:  He has no cervical  adenopathy.  Neurological: He is alert and oriented to person, place, and time.  Skin: Skin is warm and dry. No rash noted. No erythema.  Psychiatric: He has a normal mood and affect. His behavior is normal.    Lab Results  Component Value Date   CD4TCELL 26* 03/12/2016   Lab Results  Component Value Date   CD4TABS 430 03/12/2016   CD4TABS 290* 09/04/2015   CD4TABS 340* 05/15/2015   Lab Results  Component Value Date   HIV1RNAQUANT <20 03/12/2016   Lab Results  Component Value Date   HEPBSAB POS* 01/17/2014   No results found for: RPR  CBC Lab Results  Component Value Date   WBC 5.2 03/12/2016   RBC 4.67 03/12/2016   HGB 15.9 03/12/2016   HCT 45.5 03/12/2016   PLT 221 03/12/2016   MCV 97.4 03/12/2016   MCH 34.0* 03/12/2016   MCHC 34.9 03/12/2016   RDW 12.4 03/12/2016   LYMPHSABS 1456 03/12/2016   MONOABS 520 03/12/2016   EOSABS 312 03/12/2016   BASOSABS 52 03/12/2016   BMET Lab Results  Component Value Date   NA 141 03/12/2016   K 3.7 03/12/2016   CL 104 03/12/2016   CO2 27 03/12/2016   GLUCOSE 77 03/12/2016   BUN 12 03/12/2016   CREATININE 1.00 03/12/2016   CALCIUM 9.7 03/12/2016   GFRNONAA 87 03/12/2016   GFRAA >89 03/12/2016     Assessment and Plan  hiv disease = continue on genvoya. Well controlled  sti screening = we will check rpr,  Gc/chlam rectal and oral  htn = not at goal but he reports not taking his medications this morning. We will repeat at next visit and ensure he takes meds in the am. He is to continue on hctz and lisinopril  Chronic headache = continue with  elavil

## 2016-05-23 LAB — RPR

## 2016-05-23 LAB — CYTOLOGY, (ORAL, ANAL, URETHRAL) ANCILLARY ONLY
CHLAMYDIA, DNA PROBE: NEGATIVE
CHLAMYDIA, DNA PROBE: POSITIVE — AB
NEISSERIA GONORRHEA: NEGATIVE
NEISSERIA GONORRHEA: NEGATIVE

## 2016-05-26 ENCOUNTER — Telehealth: Payer: Self-pay | Admitting: *Deleted

## 2016-05-26 ENCOUNTER — Ambulatory Visit (INDEPENDENT_AMBULATORY_CARE_PROVIDER_SITE_OTHER): Payer: Self-pay | Admitting: *Deleted

## 2016-05-26 DIAGNOSIS — A749 Chlamydial infection, unspecified: Secondary | ICD-10-CM

## 2016-05-26 MED ORDER — AZITHROMYCIN 250 MG PO TABS
1000.0000 mg | ORAL_TABLET | Freq: Once | ORAL | Status: AC
  Administered 2016-05-26: 1000 mg via ORAL

## 2016-05-26 NOTE — Telephone Encounter (Signed)
Per Dr Baxter Flattery called the patient to have him come for treatment for a positive chlamydia. Had to leave a message for him to call the office will tell him then.

## 2016-05-26 NOTE — Telephone Encounter (Signed)
Coming this AM for tx.

## 2016-05-26 NOTE — Telephone Encounter (Signed)
-----   Message from Carlyle Basques, MD sent at 05/24/2016  1:11 AM EDT ----- Can you have him come in to get treated for chlamydia. He will need 1g azithromycin

## 2016-05-29 ENCOUNTER — Encounter: Payer: Self-pay | Admitting: Internal Medicine

## 2016-06-14 ENCOUNTER — Other Ambulatory Visit: Payer: Self-pay | Admitting: Internal Medicine

## 2016-06-14 DIAGNOSIS — B20 Human immunodeficiency virus [HIV] disease: Secondary | ICD-10-CM

## 2016-06-14 DIAGNOSIS — I10 Essential (primary) hypertension: Secondary | ICD-10-CM

## 2016-06-19 ENCOUNTER — Other Ambulatory Visit: Payer: Self-pay | Admitting: *Deleted

## 2016-06-19 DIAGNOSIS — I10 Essential (primary) hypertension: Secondary | ICD-10-CM

## 2016-06-19 MED ORDER — HYDROCHLOROTHIAZIDE 25 MG PO TABS: 25.0000 mg | ORAL_TABLET | Freq: Every day | ORAL | 5 refills | Status: DC

## 2016-08-29 ENCOUNTER — Other Ambulatory Visit: Payer: Self-pay | Admitting: Internal Medicine

## 2016-09-14 ENCOUNTER — Other Ambulatory Visit: Payer: Self-pay | Admitting: Internal Medicine

## 2016-09-15 ENCOUNTER — Telehealth: Payer: Self-pay | Admitting: *Deleted

## 2016-09-15 NOTE — Telephone Encounter (Signed)
Patient requesting refill of tivicay - discontinued by Dr Baxter Flattery 05/2016, patient should be on genvoya alone per chart. RN contacted pharmacy to update medication regimen. Patient has been sporadically picking up tivicay with his genvoya this year. Landis Gandy, RN

## 2016-12-22 ENCOUNTER — Encounter: Payer: Self-pay | Admitting: Internal Medicine

## 2017-01-20 ENCOUNTER — Encounter: Payer: Self-pay | Admitting: Internal Medicine

## 2017-01-20 ENCOUNTER — Ambulatory Visit: Payer: Self-pay | Admitting: Internal Medicine

## 2017-01-20 ENCOUNTER — Ambulatory Visit (INDEPENDENT_AMBULATORY_CARE_PROVIDER_SITE_OTHER): Payer: Self-pay | Admitting: Internal Medicine

## 2017-01-20 VITALS — BP 130/90 | HR 80 | Temp 98.5°F | Wt 199.0 lb

## 2017-01-20 DIAGNOSIS — M722 Plantar fascial fibromatosis: Secondary | ICD-10-CM

## 2017-01-20 DIAGNOSIS — Z23 Encounter for immunization: Secondary | ICD-10-CM

## 2017-01-20 DIAGNOSIS — B20 Human immunodeficiency virus [HIV] disease: Secondary | ICD-10-CM

## 2017-01-20 DIAGNOSIS — R140 Abdominal distension (gaseous): Secondary | ICD-10-CM

## 2017-01-20 DIAGNOSIS — Z79899 Other long term (current) drug therapy: Secondary | ICD-10-CM

## 2017-01-20 LAB — CBC WITH DIFFERENTIAL/PLATELET
Basophils Absolute: 49 cells/uL (ref 0–200)
Basophils Relative: 1 %
EOS ABS: 245 {cells}/uL (ref 15–500)
Eosinophils Relative: 5 %
HEMATOCRIT: 45.8 % (ref 38.5–50.0)
Hemoglobin: 15.3 g/dL (ref 13.2–17.1)
Lymphocytes Relative: 24 %
Lymphs Abs: 1176 cells/uL (ref 850–3900)
MCH: 33.7 pg — ABNORMAL HIGH (ref 27.0–33.0)
MCHC: 33.4 g/dL (ref 32.0–36.0)
MCV: 100.9 fL — AB (ref 80.0–100.0)
MONO ABS: 343 {cells}/uL (ref 200–950)
MPV: 10.5 fL (ref 7.5–12.5)
Monocytes Relative: 7 %
NEUTROS PCT: 63 %
Neutro Abs: 3087 cells/uL (ref 1500–7800)
Platelets: 236 10*3/uL (ref 140–400)
RBC: 4.54 MIL/uL (ref 4.20–5.80)
RDW: 12.5 % (ref 11.0–15.0)
WBC: 4.9 10*3/uL (ref 3.8–10.8)

## 2017-01-20 LAB — COMPLETE METABOLIC PANEL WITH GFR
ALT: 22 U/L (ref 9–46)
AST: 18 U/L (ref 10–35)
Albumin: 4.2 g/dL (ref 3.6–5.1)
Alkaline Phosphatase: 36 U/L — ABNORMAL LOW (ref 40–115)
BUN: 12 mg/dL (ref 7–25)
CALCIUM: 9.1 mg/dL (ref 8.6–10.3)
CHLORIDE: 107 mmol/L (ref 98–110)
CO2: 26 mmol/L (ref 20–31)
Creat: 0.87 mg/dL (ref 0.70–1.33)
Glucose, Bld: 86 mg/dL (ref 65–99)
POTASSIUM: 3.9 mmol/L (ref 3.5–5.3)
Sodium: 140 mmol/L (ref 135–146)
Total Bilirubin: 0.7 mg/dL (ref 0.2–1.2)
Total Protein: 6.8 g/dL (ref 6.1–8.1)

## 2017-01-20 LAB — LIPID PANEL
CHOL/HDL RATIO: 3.6 ratio (ref <5.0)
CHOLESTEROL: 148 mg/dL (ref <200)
HDL: 41 mg/dL (ref >40)
LDL Cholesterol: 85 mg/dL (ref <100)
Triglycerides: 108 mg/dL (ref <150)
VLDL: 22 mg/dL (ref <30)

## 2017-01-20 NOTE — Progress Notes (Signed)
RFV: hiv disease follow up  [name], male   DOB: 20-Mar-1966, 51 y.o.   MRN: [id number]  HPI Colin Alexander is 51yo M with CD 4 count of 430/VL<20 on genvoya. Doing well. 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 No unprotected sex since last seen in clinic. Denies any recent illnesses  Occasionally notices pain in bottoms of feet/heels in particular due to spending much time on his feet. He also has noticed increasing flatulence in the last few months, though no change in his dietary intake  Outpatient Encounter Prescriptions as of 01/20/2017  Medication Sig  . amitriptyline (ELAVIL) 25 MG tablet Take 1 tablet (25 mg total) by mouth at bedtime. Start with 1/2 tab at bedtime x 8 days, then increase to 1 tab at night  . Aspirin-Salicylamide-Caffeine (BC HEADACHE POWDER PO) Take 1 packet by mouth daily as needed (pain or headache). Reported on 12/06/2015  . cetirizine (ZYRTEC) 10 MG tablet Take 1 tablet (10 mg total) by mouth daily.  . GENVOYA 150-150-200-10 MG TABS tablet TAKE 1 TABLET BY MOUTH DAILY WITH BREAKFAST  . hydrochlorothiazide (HYDRODIURIL) 25 MG tablet Take 1 tablet (25 mg total) by mouth daily.  Marland Kitchen lisinopril (PRINIVIL,ZESTRIL) 40 MG tablet TAKE 1 TABLET(40 MG) BY MOUTH DAILY  . valACYclovir (VALTREX) 1000 MG tablet TAKE 1 TABLET BY MOUTH DAILY   No facility-administered encounter medications on file as of 01/20/2017.      Patient Active Problem List   Diagnosis Date Noted  . Herpes genitalis   . Bronchitis 10/19/2012  . Atypical chest pain 10/18/2012  . Family history of premature CAD 10/18/2012  . Chest pain 10/17/2012  . HSV (herpes simplex virus) anogenital infection 06/07/2012  . HIV (human immunodeficiency virus infection) ([place]) 01/13/2012  . HTN (hypertension) 12/31/2011     Health Maintenance Due  Topic Date Due  . COLONOSCOPY  11/26/2014  . INFLUENZA VACCINE  06/10/2016     Review of Systems + noticing more difficulty in reading. Otherwise  per hpi, other 10 point ros is negative  Physical Exam  BP 130/90   Pulse 80   Temp 98.5 F (36.9 C) (Oral)   Wt 199 lb (90.3 kg)   BMI 30.26 kg/m  Physical Exam  Constitutional: He is oriented to person, place, and time. He appears well-developed and well-nourished. No distress.  HENT:  Mouth/Throat: Oropharynx is clear and moist. No oropharyngeal exudate.  Cardiovascular: Normal rate, regular rhythm and normal heart sounds. Exam reveals no gallop and no friction rub.  No murmur heard.  Pulmonary/Chest: Effort normal and breath sounds normal. No respiratory distress. He has no wheezes.  Abdominal: Soft. Bowel sounds are normal. He exhibits no distension. There is no tenderness.  Lymphadenopathy:  He has no cervical adenopathy.  Neurological: He is alert and oriented to person, place, and time.  Skin: Skin is warm and dry. No rash noted. No erythema.  Psychiatric: He has a normal mood and affect. His behavior is normal.     Lab Results  Component Value Date   CD4TCELL 26 (L) 03/12/2016   Lab Results  Component Value Date   CD4TABS 430 03/12/2016   CD4TABS 290 (L) 09/04/2015   CD4TABS 340 (L) 05/15/2015   Lab Results  Component Value Date   HIV1RNAQUANT <20 03/12/2016   Lab Results  Component Value Date   HEPBSAB POS (A) 01/17/2014   Lab Results  Component Value Date   LABRPR NON REAC 05/22/2016    CBC  Lab Results  Component Value Date   WBC 5.2 03/12/2016   RBC 4.67 03/12/2016   HGB 15.9 03/12/2016   HCT 45.5 03/12/2016   PLT 221 03/12/2016   MCV 97.4 03/12/2016   MCH 34.0 (H) 03/12/2016   MCHC 34.9 03/12/2016   RDW 12.4 03/12/2016   LYMPHSABS 1,456 03/12/2016   MONOABS 520 03/12/2016   EOSABS 312 03/12/2016    BMET Lab Results  Component Value Date   NA 141 03/12/2016   K 3.7 03/12/2016   CL 104 03/12/2016   CO2 27 03/12/2016   GLUCOSE 77 03/12/2016   BUN 12 03/12/2016   CREATININE 1.00 03/12/2016   CALCIUM 9.7 03/12/2016   GFRNONAA 87  03/12/2016   GFRAA >89 03/12/2016      Assessment and Plan  hiv disease = will check labs today. Plan to continue on [place] maintenance = will give flu and meningococcal vaccine today  Plantar fasciitis = gave tips, rec inserts in shoes, can do high dose ibuprofen for pain  Recommend vision test  Increase intra-abd gas/flatus = recommend to try probiotic yogurt. Simethicone for sx relief

## 2017-01-21 LAB — URINE CYTOLOGY ANCILLARY ONLY
Chlamydia: NEGATIVE
Neisseria Gonorrhea: NEGATIVE

## 2017-01-21 LAB — T-HELPER CELL (CD4) - (RCID CLINIC ONLY)
CD4 T CELL HELPER: 27 % — AB (ref 33–55)
CD4 T Cell Abs: 310 /uL — ABNORMAL LOW (ref 400–2700)

## 2017-01-21 LAB — RPR

## 2017-01-22 LAB — HIV-1 RNA QUANT-NO REFLEX-BLD
HIV 1 RNA Quant: <20 copies/mL
HIV-1 RNA Quant, Log: <1.3 Log copies/mL

## 2017-01-30 ENCOUNTER — Other Ambulatory Visit: Payer: Self-pay | Admitting: Internal Medicine

## 2017-01-30 DIAGNOSIS — I10 Essential (primary) hypertension: Secondary | ICD-10-CM

## 2017-01-30 DIAGNOSIS — B20 Human immunodeficiency virus [HIV] disease: Secondary | ICD-10-CM

## 2017-03-07 ENCOUNTER — Other Ambulatory Visit: Payer: Self-pay | Admitting: Internal Medicine

## 2017-03-07 DIAGNOSIS — I10 Essential (primary) hypertension: Secondary | ICD-10-CM

## 2017-04-23 ENCOUNTER — Encounter: Payer: Self-pay | Admitting: Internal Medicine

## 2017-04-23 ENCOUNTER — Ambulatory Visit (INDEPENDENT_AMBULATORY_CARE_PROVIDER_SITE_OTHER): Payer: Self-pay | Admitting: Internal Medicine

## 2017-04-23 VITALS — BP 139/101 | HR 94 | Temp 98.6°F | Wt 198.0 lb

## 2017-04-23 DIAGNOSIS — Z79899 Other long term (current) drug therapy: Secondary | ICD-10-CM

## 2017-04-23 DIAGNOSIS — I10 Essential (primary) hypertension: Secondary | ICD-10-CM

## 2017-04-23 DIAGNOSIS — B20 Human immunodeficiency virus [HIV] disease: Secondary | ICD-10-CM

## 2017-04-23 MED ORDER — LISINOPRIL 40 MG PO TABS: ORAL_TABLET | ORAL | 5 refills | Status: DC

## 2017-04-23 MED ORDER — AMLODIPINE BESYLATE 10 MG PO TABS: 10.0000 mg | ORAL_TABLET | Freq: Every day | ORAL | 11 refills | Status: DC

## 2017-04-23 NOTE — Progress Notes (Signed)
RFV: folow up for hiv disease [name], male   DOB: December 06, 1965, 51 y.o.   MRN: [id number]  HPI 51yo M with HIV disease, CD 4 count of 310/VL<20, currently on genvoya. Doing well with adherence. Not sexually active since we last saw him. His health has been good with exception to still Having issues with plantar fasciitis. He is going on vacation to the [place] this summer. No other illnesses since last visit.   Outpatient Encounter Prescriptions as of 04/23/2017  Medication Sig  . amitriptyline (ELAVIL) 25 MG tablet Take 1 tablet (25 mg total) by mouth at bedtime. Start with 1/2 tab at bedtime x 8 days, then increase to 1 tab at night  . Aspirin-Salicylamide-Caffeine (BC HEADACHE POWDER PO) Take 1 packet by mouth daily as needed (pain or headache). Reported on 12/06/2015  . cetirizine (ZYRTEC) 10 MG tablet Take 1 tablet (10 mg total) by mouth daily.  . GENVOYA 150-150-200-10 MG TABS tablet TAKE 1 TABLET BY MOUTH DAILY WITH BREAKFAST  . hydrochlorothiazide (HYDRODIURIL) 25 MG tablet TAKE 1 TABLET(25 MG) BY MOUTH DAILY  . lisinopril (PRINIVIL,ZESTRIL) 40 MG tablet TAKE 1 TABLET(40 MG) BY MOUTH DAILY  . valACYclovir (VALTREX) 1000 MG tablet TAKE 1 TABLET BY MOUTH DAILY   No facility-administered encounter medications on file as of 04/23/2017.      Patient Active Problem List   Diagnosis Date Noted  . Herpes genitalis   . Bronchitis 10/19/2012  . Atypical chest pain 10/18/2012  . Family history of premature CAD 10/18/2012  . Chest pain 10/17/2012  . HSV (herpes simplex virus) anogenital infection 06/07/2012  . HIV (human immunodeficiency virus infection) ([place]) 01/13/2012  . HTN (hypertension) 12/31/2011     Health Maintenance Due  Topic Date Due  . COLONOSCOPY  11/26/2014    Social History  Substance Use Topics  . Smoking status: Never Smoker  . Smokeless tobacco: Never Used  . Alcohol use No    Review of Systems Review of Systems  Constitutional:  Negative for fever, chills, diaphoresis, activity change, appetite change, fatigue and unexpected weight change.  HENT: Negative for congestion, sore throat, rhinorrhea, sneezing, trouble swallowing and sinus pressure.  Eyes: Negative for photophobia and visual disturbance.  Respiratory: Negative for cough, chest tightness, shortness of breath, wheezing and stridor.  Cardiovascular: Negative for chest pain, palpitations and leg swelling.  Gastrointestinal: Negative for nausea, vomiting, abdominal pain, diarrhea, constipation, blood in stool, abdominal distention and anal bleeding.  Genitourinary: Negative for dysuria, hematuria, flank pain and difficulty urinating.  Musculoskeletal: + right foot pain/heel pain Skin: Negative for color change, pallor, rash and wound.  Neurological: Negative for dizziness, tremors, weakness and light-headedness.  Hematological: Negative for adenopathy. Does not bruise/bleed easily.  Psychiatric/Behavioral: Negative for behavioral problems, confusion, sleep disturbance, dysphoric mood, decreased concentration and agitation.    Physical Exam   BP (!) 139/101   Pulse 94   Temp 98.6 F (37 C) (Oral)   Wt 198 lb (89.8 kg)   BMI 30.11 kg/m  , repeat BP 146/98  Physical Exam  Constitutional: He is oriented to person, place, and time. He appears well-developed and well-nourished. No distress.  HENT:  Mouth/Throat: Oropharynx is clear and moist. No oropharyngeal exudate.  Cardiovascular: Normal rate, regular rhythm and normal heart sounds. Exam reveals no gallop and no friction rub.  No murmur heard.  Pulmonary/Chest: Effort normal and breath sounds normal. No respiratory distress. He has no wheezes.  Abdominal: Soft. Bowel sounds are normal. He  exhibits no distension. There is no tenderness.  Lymphadenopathy:  He has no cervical adenopathy.  Neurological: He is alert and oriented to person, place, and time.  Skin: Skin is warm and dry. No rash noted. No  erythema.  Psychiatric: He has a normal mood and affect. His behavior is normal.     Lab Results  Component Value Date   CD4TCELL 27 (L) 01/20/2017   Lab Results  Component Value Date   CD4TABS 310 (L) 01/20/2017   CD4TABS 430 03/12/2016   CD4TABS 290 (L) 09/04/2015   Lab Results  Component Value Date   HIV1RNAQUANT <20 NOT DETECTED 01/20/2017   Lab Results  Component Value Date   HEPBSAB POS (A) 01/17/2014   Lab Results  Component Value Date   LABRPR NON REAC 01/20/2017    CBC Lab Results  Component Value Date   WBC 4.9 01/20/2017   RBC 4.54 01/20/2017   HGB 15.3 01/20/2017   HCT 45.8 01/20/2017   PLT 236 01/20/2017   MCV 100.9 (H) 01/20/2017   MCH 33.7 (H) 01/20/2017   MCHC 33.4 01/20/2017   RDW 12.5 01/20/2017   LYMPHSABS 1,176 01/20/2017   MONOABS 343 01/20/2017   EOSABS 245 01/20/2017    BMET Lab Results  Component Value Date   NA 140 01/20/2017   K 3.9 01/20/2017   CL 107 01/20/2017   CO2 26 01/20/2017   GLUCOSE 86 01/20/2017   BUN 12 01/20/2017   CREATININE 0.87 01/20/2017   CALCIUM 9.1 01/20/2017   GFRNONAA >89 01/20/2017   GFRAA >89 01/20/2017      Assessment and Plan  hiv disease = well controlled. Continue on genvoya  htn = poorly controlled despite, Lisinopril plus HCTZ. Will change out HCTZ for amlodipine and continue on lisinopril based upon ACCOMPLISH trial. Will see if it improves his outcomes. Have BP check in 2 wks  Long term medication monitoring = will continue genvoya. Cr stable

## 2017-04-23 NOTE — Patient Instructions (Signed)
Start taking amlodipine 1/2 tab per day x 4 days then increase to full tablet a day.  Stop taking HCTZ when you have started the amlodipine  Continue on Lisinopril 1 tab daily

## 2017-06-08 ENCOUNTER — Ambulatory Visit: Payer: Self-pay

## 2017-06-10 ENCOUNTER — Encounter: Payer: Self-pay | Admitting: Internal Medicine

## 2017-07-01 ENCOUNTER — Ambulatory Visit (INDEPENDENT_AMBULATORY_CARE_PROVIDER_SITE_OTHER): Payer: Self-pay | Admitting: Internal Medicine

## 2017-07-01 ENCOUNTER — Encounter: Payer: Self-pay | Admitting: Internal Medicine

## 2017-07-01 VITALS — BP 125/79 | HR 83 | Temp 98.4°F | Wt 199.0 lb

## 2017-07-01 DIAGNOSIS — I10 Essential (primary) hypertension: Secondary | ICD-10-CM

## 2017-07-01 DIAGNOSIS — R600 Localized edema: Secondary | ICD-10-CM

## 2017-07-01 DIAGNOSIS — B20 Human immunodeficiency virus [HIV] disease: Secondary | ICD-10-CM

## 2017-07-01 DIAGNOSIS — R0609 Other forms of dyspnea: Secondary | ICD-10-CM

## 2017-07-01 LAB — CBC WITH DIFFERENTIAL/PLATELET
Basophils Absolute: 58 {cells}/uL (ref 0–200)
Basophils Relative: 1 %
Eosinophils Absolute: 232 {cells}/uL (ref 15–500)
Eosinophils Relative: 4 %
HCT: 45.8 % (ref 38.5–50.0)
Hemoglobin: 15.9 g/dL (ref 13.2–17.1)
Lymphocytes Relative: 31 %
Lymphs Abs: 1798 {cells}/uL (ref 850–3900)
MCH: 34.2 pg — ABNORMAL HIGH (ref 27.0–33.0)
MCHC: 34.7 g/dL (ref 32.0–36.0)
MCV: 98.5 fL (ref 80.0–100.0)
MPV: 10.6 fL (ref 7.5–12.5)
Monocytes Absolute: 522 {cells}/uL (ref 200–950)
Monocytes Relative: 9 %
Neutro Abs: 3190 {cells}/uL (ref 1500–7800)
Neutrophils Relative %: 55 %
Platelets: 280 K/uL (ref 140–400)
RBC: 4.65 MIL/uL (ref 4.20–5.80)
RDW: 13 % (ref 11.0–15.0)
WBC: 5.8 K/uL (ref 3.8–10.8)

## 2017-07-01 MED ORDER — HYDROCHLOROTHIAZIDE 25 MG PO TABS: 25.0000 mg | ORAL_TABLET | Freq: Every day | ORAL | 11 refills | Status: DC

## 2017-07-01 MED ORDER — BICTEGRAVIR-EMTRICITAB-TENOFOV 50-200-25 MG PO TABS: 1.0000 | ORAL_TABLET | Freq: Every day | ORAL | 11 refills | Status: DC

## 2017-07-01 NOTE — Progress Notes (Signed)
RFV: lower extremity swelling  [name], male   DOB: 1966/02/28, 51 y.o.   MRN: [id number]  HPI Colin Alexander is a 51yo M with HIV disease, HTN, CD 4 count of 310/LV<20 on genvoya.   Started back to work on 8/13. But helped with move in on 8/11 but at the end of the day noticed his legs/feet swelling. 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 Left foot/dorsum slightly more swollen When he wakes up in the morning the swelling has resolved  Also has ongoing plantar fasciitis.   No shortness of breath when walking or with flight of stairs x 1 but starts noticing SOB at 2nd flight   Still working part-time and not yet full-time, thus does not have health insurance  Family hx: hypertension, hypercholesteremia. Dad had CABG at 73 yo. Prostate ca in dad. No DM, no ESRD   Outpatient Encounter Prescriptions as of 07/01/2017  Medication Sig  . amitriptyline (ELAVIL) 25 MG tablet Take 1 tablet (25 mg total) by mouth at bedtime. Start with 1/2 tab at bedtime x 8 days, then increase to 1 tab at night  . amLODipine (NORVASC) 10 MG tablet Take 1 tablet (10 mg total) by mouth daily.  . Aspirin-Salicylamide-Caffeine (BC HEADACHE POWDER PO) Take 1 packet by mouth daily as needed (pain or headache). Reported on 12/06/2015  . cetirizine (ZYRTEC) 10 MG tablet Take 1 tablet (10 mg total) by mouth daily.  . GENVOYA 150-150-200-10 MG TABS tablet TAKE 1 TABLET BY MOUTH DAILY WITH BREAKFAST  . lisinopril (PRINIVIL,ZESTRIL) 40 MG tablet TAKE 1 TABLET(40 MG) BY MOUTH DAILY  . valACYclovir (VALTREX) 1000 MG tablet TAKE 1 TABLET BY MOUTH DAILY   No facility-administered encounter medications on file as of 07/01/2017.      Patient Active Problem List   Diagnosis Date Noted  . Herpes genitalis   . Bronchitis 10/19/2012  . Atypical chest pain 10/18/2012  . Family history of premature CAD 10/18/2012  . Chest pain 10/17/2012  . HSV (herpes simplex virus) anogenital  infection 06/07/2012  . HIV (human immunodeficiency virus infection) ([place]) 01/13/2012  . HTN (hypertension) 12/31/2011     Health Maintenance Due  Topic Date Due  . COLONOSCOPY  11/26/2014  . INFLUENZA VACCINE  06/10/2017     Review of Systems Review of Systems  Constitutional: Negative for fever, chills, diaphoresis, activity change, appetite change, fatigue and unexpected weight change.  HENT: Negative for congestion, sore throat, rhinorrhea, sneezing, trouble swallowing and sinus pressure.  Eyes: Negative for photophobia and visual disturbance.  Respiratory: Negative for cough, chest tightness, shortness of breath, wheezing and stridor.  Cardiovascular: Negative for chest pain, palpitations and leg swelling.  Gastrointestinal: Negative for nausea, vomiting, abdominal pain, diarrhea, constipation, blood in stool, abdominal distention and anal bleeding.  Genitourinary: Negative for dysuria, hematuria, flank pain and difficulty urinating.  Musculoskeletal: Negative for myalgias, back pain, joint swelling, arthralgias abut has leg swelling Skin: Negative for color change, pallor, rash and wound.  Neurological: Negative for dizziness, tremors, weakness and light-headedness.  Hematological: Negative for adenopathy. Does not bruise/bleed easily.  Psychiatric/Behavioral: Negative for behavioral problems, confusion, sleep disturbance, dysphoric mood, decreased concentration and agitation.    Physical Exam  BP 125/79   Pulse 83   Temp 98.4 F (36.9 C) (Oral)   Wt 199 lb (90.3 kg)   BMI 30.26 kg/m  Physical Exam  Constitutional: He is oriented to person, place, and time.  He appears well-developed and well-nourished. No distress.  HENT:  Mouth/Throat: Oropharynx is clear and moist. No oropharyngeal exudate.  Cardiovascular: Normal rate, regular rhythm and normal heart sounds. Exam reveals no gallop and no friction rub.  No murmur heard.  Pulmonary/Chest: Effort normal and breath  sounds normal. No respiratory distress. He has no wheezes.   Neurological: He is alert and oriented to person, place, and time.  Skin: Skin is warm and dry. No rash noted. No erythema.  Ext: trace pitting edema anterior tibial region bilateral but slightly worse on left dorsum of foot Psychiatric: He has a normal mood and affect. His behavior is normal.     Lab Results  Component Value Date   CD4TCELL 27 (L) 01/20/2017   Lab Results  Component Value Date   CD4TABS 310 (L) 01/20/2017   CD4TABS 430 03/12/2016   CD4TABS 290 (L) 09/04/2015   Lab Results  Component Value Date   HIV1RNAQUANT <20 NOT DETECTED 01/20/2017   Lab Results  Component Value Date   HEPBSAB POS (A) 01/17/2014   Lab Results  Component Value Date   LABRPR NON REAC 01/20/2017    CBC Lab Results  Component Value Date   WBC 4.9 01/20/2017   RBC 4.54 01/20/2017   HGB 15.3 01/20/2017   HCT 45.8 01/20/2017   PLT 236 01/20/2017   MCV 100.9 (H) 01/20/2017   MCH 33.7 (H) 01/20/2017   MCHC 33.4 01/20/2017   RDW 12.5 01/20/2017   LYMPHSABS 1,176 01/20/2017   MONOABS 343 01/20/2017   EOSABS 245 01/20/2017    BMET Lab Results  Component Value Date   NA 140 01/20/2017   K 3.9 01/20/2017   CL 107 01/20/2017   CO2 26 01/20/2017   GLUCOSE 86 01/20/2017   BUN 12 01/20/2017   CREATININE 0.87 01/20/2017   CALCIUM 9.1 01/20/2017   GFRNONAA >89 01/20/2017   GFRAA >89 01/20/2017    Assessment and Plan  Lower extremity swelling = will do compression stockings, I suspect this is due to immobility   DOE  At 2nd flight of stairs =is concerning for either deconditioning vs. Maybe worth doing cardiac stress test given family hx of htn and heart disease  htn = well controlled. Continue on hctz, amlodipine  hiv disease = switch to biktarvy. Lets check labs as well today

## 2017-07-02 LAB — COMPLETE METABOLIC PANEL WITH GFR
ALT: 27 U/L (ref 9–46)
AST: 19 U/L (ref 10–35)
Albumin: 4.6 g/dL (ref 3.6–5.1)
Alkaline Phosphatase: 41 U/L (ref 40–115)
BUN: 8 mg/dL (ref 7–25)
CHLORIDE: 99 mmol/L (ref 98–110)
CO2: 26 mmol/L (ref 20–32)
CREATININE: 0.89 mg/dL (ref 0.70–1.33)
Calcium: 9.8 mg/dL (ref 8.6–10.3)
GFR, Est Non African American: >89 mL/min (ref >=60)
Glucose, Bld: 80 mg/dL (ref 65–99)
POTASSIUM: 3.5 mmol/L (ref 3.5–5.3)
Sodium: 140 mmol/L (ref 135–146)
Total Bilirubin: 0.8 mg/dL (ref 0.2–1.2)
Total Protein: 7.3 g/dL (ref 6.1–8.1)

## 2017-07-02 LAB — T-HELPER CELL (CD4) - (RCID CLINIC ONLY)
CD4 % Helper T Cell: 24 % — ABNORMAL LOW (ref 33–55)
CD4 T CELL ABS: 490 /uL (ref 400–2700)

## 2017-07-02 LAB — FLUORESCENT TREPONEMAL AB(FTA)-IGG-BLD: FLUORESCENT TREPONEMAL ABS: REACTIVE — AB

## 2017-07-02 LAB — URINE CYTOLOGY ANCILLARY ONLY
CHLAMYDIA, DNA PROBE: NEGATIVE
NEISSERIA GONORRHEA: NEGATIVE

## 2017-07-02 LAB — RPR TITER: RPR Titer: 1:2 {titer}

## 2017-07-02 LAB — RPR: RPR Ser Ql: REACTIVE — AB

## 2017-07-03 ENCOUNTER — Telehealth: Payer: Self-pay | Admitting: *Deleted

## 2017-07-03 DIAGNOSIS — A539 Syphilis, unspecified: Secondary | ICD-10-CM

## 2017-07-03 LAB — HIV-1 RNA QUANT-NO REFLEX-BLD
HIV 1 RNA Quant: 60 {copies}/mL — ABNORMAL HIGH
HIV-1 RNA Quant, Log: 1.78 {Log_copies}/mL — ABNORMAL HIGH

## 2017-07-03 NOTE — Telephone Encounter (Signed)
Spoke with patient. He cannot come until Wednesday.  Patient scheduled for 8/29 at 11:30.   Landis Gandy, RN

## 2017-07-03 NOTE — Telephone Encounter (Signed)
-----   Message from Carlyle Basques, MD sent at 07/03/2017  9:56 AM EDT ----- Can you have him come in to get penicillin injection. It looks like he may have been exposed to syphilis. Though I think he told me he has not had any recent sex

## 2017-07-08 ENCOUNTER — Ambulatory Visit: Payer: Self-pay

## 2017-07-08 MED ORDER — DOXYCYCLINE HYCLATE 100 MG PO TABS: 100.0000 mg | ORAL_TABLET | Freq: Two times a day (BID) | ORAL | 0 refills | Status: DC

## 2017-07-08 NOTE — Telephone Encounter (Signed)
Patient stated his allergy to penicillin is shortness of breath.  Per verbal order from Dr Baxter Flattery, will send doxycycline 100 mg twice daily for 14 days.  Pt will pick up today from [facility].

## 2017-07-08 NOTE — Addendum Note (Signed)
Addended by: Landis Gandy on: 07/08/2017 02:12 PM   Modules accepted: Orders

## 2017-07-27 ENCOUNTER — Telehealth: Payer: Self-pay | Admitting: *Deleted

## 2017-07-27 NOTE — Telephone Encounter (Signed)
error 

## 2017-08-14 ENCOUNTER — Other Ambulatory Visit: Payer: Self-pay | Admitting: Internal Medicine

## 2017-08-14 DIAGNOSIS — B20 Human immunodeficiency virus [HIV] disease: Secondary | ICD-10-CM

## 2017-08-14 DIAGNOSIS — B009 Herpesviral infection, unspecified: Secondary | ICD-10-CM

## 2017-08-17 ENCOUNTER — Other Ambulatory Visit: Payer: Self-pay | Admitting: *Deleted

## 2017-08-17 DIAGNOSIS — B20 Human immunodeficiency virus [HIV] disease: Secondary | ICD-10-CM

## 2017-08-17 MED ORDER — BICTEGRAVIR-EMTRICITAB-TENOFOV 50-200-25 MG PO TABS: 1.0000 | ORAL_TABLET | Freq: Every day | ORAL | 11 refills | Status: DC

## 2017-09-23 ENCOUNTER — Ambulatory Visit: Payer: Self-pay | Admitting: Internal Medicine

## 2017-09-26 ENCOUNTER — Other Ambulatory Visit: Payer: Self-pay | Admitting: Internal Medicine

## 2017-09-26 DIAGNOSIS — I10 Essential (primary) hypertension: Secondary | ICD-10-CM

## 2017-11-05 ENCOUNTER — Telehealth: Payer: Self-pay

## 2017-11-05 NOTE — Telephone Encounter (Signed)
Initiated PA for [employer] for the pt which was approved, however, due to the [employer] stated that the medication must be sent to a speciality pharmacy and that they would notify the pt about these changes. [place]

## 2017-11-12 ENCOUNTER — Other Ambulatory Visit: Payer: Worker's Compensation

## 2017-11-12 DIAGNOSIS — B20 Human immunodeficiency virus [HIV] disease: Secondary | ICD-10-CM

## 2017-11-13 LAB — T-HELPER CELL (CD4) - (RCID CLINIC ONLY)
CD4 % Helper T Cell: 27 % — ABNORMAL LOW (ref 33–55)
CD4 T CELL ABS: 450 /uL (ref 400–2700)

## 2017-11-14 LAB — COMPLETE METABOLIC PANEL WITH GFR
AG Ratio: 1.5 (calc) (ref 1.0–2.5)
ALBUMIN MSPROF: 4.2 g/dL (ref 3.6–5.1)
ALKALINE PHOSPHATASE (APISO): 41 U/L (ref 40–115)
ALT: 29 U/L (ref 9–46)
AST: 16 U/L (ref 10–35)
BUN: 10 mg/dL (ref 7–25)
CHLORIDE: 104 mmol/L (ref 98–110)
CO2: 31 mmol/L (ref 20–32)
CREATININE: 0.92 mg/dL (ref 0.70–1.33)
Calcium: 9 mg/dL (ref 8.6–10.3)
GFR, EST AFRICAN AMERICAN: 110 mL/min/{1.73_m2} (ref >=60)
GFR, Est Non African American: 95 mL/min/{1.73_m2} (ref >=60)
GLUCOSE: 97 mg/dL (ref 65–99)
Globulin: 2.8 g/dL (calc) (ref 1.9–3.7)
Potassium: 3.9 mmol/L (ref 3.5–5.3)
Sodium: 141 mmol/L (ref 135–146)
TOTAL PROTEIN: 7 g/dL (ref 6.1–8.1)
Total Bilirubin: 0.7 mg/dL (ref 0.2–1.2)

## 2017-11-14 LAB — CBC WITH DIFFERENTIAL/PLATELET
BASOS PCT: 1.1 %
Basophils Absolute: 62 cells/uL (ref 0–200)
Eosinophils Absolute: 308 cells/uL (ref 15–500)
Eosinophils Relative: 5.5 %
HCT: 42.9 % (ref 38.5–50.0)
Hemoglobin: 14.8 g/dL (ref 13.2–17.1)
Lymphs Abs: 1473 cells/uL (ref 850–3900)
MCH: 33.6 pg — AB (ref 27.0–33.0)
MCHC: 34.5 g/dL (ref 32.0–36.0)
MCV: 97.3 fL (ref 80.0–100.0)
MONOS PCT: 7.3 %
MPV: 10.7 fL (ref 7.5–12.5)
NEUTROS PCT: 59.8 %
Neutro Abs: 3349 cells/uL (ref 1500–7800)
PLATELETS: 222 10*3/uL (ref 140–400)
RBC: 4.41 10*6/uL (ref 4.20–5.80)
RDW: 11.3 % (ref 11.0–15.0)
TOTAL LYMPHOCYTE: 26.3 %
WBC: 5.6 10*3/uL (ref 3.8–10.8)
WBCMIX: 409 {cells}/uL (ref 200–950)

## 2017-11-16 LAB — HIV-1 RNA QUANT-NO REFLEX-BLD
HIV 1 RNA QUANT: DETECTED {copies}/mL — AB
HIV-1 RNA Quant, Log: <1.3 Log copies/mL — AB

## 2017-12-01 ENCOUNTER — Ambulatory Visit: Payer: Self-pay | Admitting: Internal Medicine

## 2018-04-16 ENCOUNTER — Other Ambulatory Visit: Payer: Self-pay | Admitting: Behavioral Health

## 2018-04-16 DIAGNOSIS — B20 Human immunodeficiency virus [HIV] disease: Secondary | ICD-10-CM

## 2018-04-16 MED ORDER — BICTEGRAVIR-EMTRICITAB-TENOFOV 50-200-25 MG PO TABS
1.0000 | ORAL_TABLET | Freq: Every day | ORAL | 1 refills | Status: DC
Start: 2018-04-16 — End: 2018-08-25

## 2018-05-15 ENCOUNTER — Other Ambulatory Visit: Payer: Self-pay | Admitting: Internal Medicine

## 2018-06-26 ENCOUNTER — Other Ambulatory Visit: Payer: Self-pay | Admitting: Internal Medicine

## 2018-06-26 DIAGNOSIS — B009 Herpesviral infection, unspecified: Secondary | ICD-10-CM

## 2018-07-08 ENCOUNTER — Other Ambulatory Visit: Payer: Self-pay | Admitting: Internal Medicine

## 2018-08-07 ENCOUNTER — Other Ambulatory Visit: Payer: Self-pay | Admitting: Internal Medicine

## 2018-08-23 ENCOUNTER — Other Ambulatory Visit: Payer: Self-pay | Admitting: Internal Medicine

## 2018-08-23 ENCOUNTER — Telehealth: Payer: Self-pay

## 2018-08-23 DIAGNOSIS — B20 Human immunodeficiency virus [HIV] disease: Secondary | ICD-10-CM

## 2018-08-23 NOTE — Telephone Encounter (Signed)
Attempted to call patient to schedule a follow- up appointment. Patient has not been in clinic since 06/2017. Left voicemail asking patient to call back to schedule an appointment for lab and office visit with Dr. Baxter Flattery.  [place]

## 2018-08-24 NOTE — Telephone Encounter (Signed)
Yes. But make sure he has appt to come soon. Get labs first

## 2018-08-25 ENCOUNTER — Other Ambulatory Visit: Payer: Self-pay

## 2018-08-25 DIAGNOSIS — B20 Human immunodeficiency virus [HIV] disease: Secondary | ICD-10-CM

## 2018-08-25 MED ORDER — BICTEGRAVIR-EMTRICITAB-TENOFOV 50-200-25 MG PO TABS
1.0000 | ORAL_TABLET | Freq: Every day | ORAL | 0 refills | Status: DC
Start: 2018-08-25 — End: 2018-12-14

## 2018-09-19 ENCOUNTER — Other Ambulatory Visit: Payer: Self-pay | Admitting: Internal Medicine

## 2018-09-19 DIAGNOSIS — I10 Essential (primary) hypertension: Secondary | ICD-10-CM

## 2018-09-19 DIAGNOSIS — B20 Human immunodeficiency virus [HIV] disease: Secondary | ICD-10-CM

## 2018-12-02 ENCOUNTER — Other Ambulatory Visit: Payer: Self-pay | Admitting: *Deleted

## 2018-12-02 ENCOUNTER — Other Ambulatory Visit (HOSPITAL_COMMUNITY)
Admission: RE | Admit: 2018-12-02 | Discharge: 2018-12-02 | Disposition: A | Discharge status: Home / Self Care | Payer: BC Managed Care – PPO | Source: Ambulatory Visit | Attending: Internal Medicine | Admitting: Internal Medicine

## 2018-12-02 ENCOUNTER — Other Ambulatory Visit: Payer: BC Managed Care – PPO

## 2018-12-02 DIAGNOSIS — Z113 Encounter for screening for infections with a predominantly sexual mode of transmission: Secondary | ICD-10-CM

## 2018-12-02 DIAGNOSIS — Z79899 Other long term (current) drug therapy: Secondary | ICD-10-CM

## 2018-12-02 DIAGNOSIS — B20 Human immunodeficiency virus [HIV] disease: Secondary | ICD-10-CM

## 2018-12-03 LAB — T-HELPER CELL (CD4) - (RCID CLINIC ONLY)
CD4 T CELL HELPER: 20 % — AB (ref 33–55)
CD4 T Cell Abs: 330 /uL — ABNORMAL LOW (ref 400–2700)

## 2018-12-03 LAB — URINE CYTOLOGY ANCILLARY ONLY
Chlamydia: NEGATIVE
Neisseria Gonorrhea: NEGATIVE

## 2018-12-04 LAB — CBC WITH DIFFERENTIAL/PLATELET
ABSOLUTE MONOCYTES: 419 {cells}/uL (ref 200–950)
BASOS PCT: 0.9 %
Basophils Absolute: 41 cells/uL (ref 0–200)
EOS ABS: 212 {cells}/uL (ref 15–500)
Eosinophils Relative: 4.7 %
HCT: 44.7 % (ref 38.5–50.0)
Hemoglobin: 15.7 g/dL (ref 13.2–17.1)
Lymphs Abs: 1580 cells/uL (ref 850–3900)
MCH: 33.4 pg — ABNORMAL HIGH (ref 27.0–33.0)
MCHC: 35.1 g/dL (ref 32.0–36.0)
MCV: 95.1 fL (ref 80.0–100.0)
MPV: 10.6 fL (ref 7.5–12.5)
Monocytes Relative: 9.3 %
NEUTROS PCT: 50 %
Neutro Abs: 2250 cells/uL (ref 1500–7800)
PLATELETS: 238 10*3/uL (ref 140–400)
RBC: 4.7 10*6/uL (ref 4.20–5.80)
RDW: 11.3 % (ref 11.0–15.0)
TOTAL LYMPHOCYTE: 35.1 %
WBC: 4.5 10*3/uL (ref 3.8–10.8)

## 2018-12-04 LAB — COMPLETE METABOLIC PANEL WITH GFR
AG Ratio: 1.4 (calc) (ref 1.0–2.5)
ALT: 27 U/L (ref 9–46)
AST: 20 U/L (ref 10–35)
Albumin: 4.5 g/dL (ref 3.6–5.1)
Alkaline phosphatase (APISO): 39 U/L — ABNORMAL LOW (ref 40–115)
BILIRUBIN TOTAL: 0.6 mg/dL (ref 0.2–1.2)
BUN: 10 mg/dL (ref 7–25)
CHLORIDE: 102 mmol/L (ref 98–110)
CO2: 29 mmol/L (ref 20–32)
Calcium: 9.3 mg/dL (ref 8.6–10.3)
Creat: 0.97 mg/dL (ref 0.70–1.33)
GFR, Est African American: 102 mL/min/{1.73_m2} (ref >=60)
GFR, Est Non African American: 88 mL/min/{1.73_m2} (ref >=60)
Globulin: 3.2 g/dL (calc) (ref 1.9–3.7)
Glucose, Bld: 75 mg/dL (ref 65–99)
Potassium: 3.7 mmol/L (ref 3.5–5.3)
Sodium: 139 mmol/L (ref 135–146)
Total Protein: 7.7 g/dL (ref 6.1–8.1)

## 2018-12-04 LAB — HIV-1 RNA QUANT-NO REFLEX-BLD
HIV 1 RNA QUANT: 8790 {copies}/mL — AB
HIV-1 RNA QUANT, LOG: 3.94 {Log_copies}/mL — AB

## 2018-12-04 LAB — RPR: RPR Ser Ql: NONREACTIVE

## 2018-12-04 LAB — LIPID PANEL
Cholesterol: 160 mg/dL (ref <200)
HDL: 36 mg/dL — AB (ref >40)
LDL Cholesterol (Calc): 102 mg/dL (calc) — ABNORMAL HIGH
Non-HDL Cholesterol (Calc): 124 mg/dL (calc) (ref <130)
Total CHOL/HDL Ratio: 4.4 (calc) (ref <5.0)
Triglycerides: 128 mg/dL (ref <150)

## 2018-12-07 ENCOUNTER — Encounter (INDEPENDENT_AMBULATORY_CARE_PROVIDER_SITE_OTHER): Payer: Self-pay | Admitting: *Deleted

## 2018-12-14 ENCOUNTER — Ambulatory Visit (INDEPENDENT_AMBULATORY_CARE_PROVIDER_SITE_OTHER): Payer: BC Managed Care – PPO | Admitting: Internal Medicine

## 2018-12-14 ENCOUNTER — Encounter: Payer: Self-pay | Admitting: Internal Medicine

## 2018-12-14 VITALS — BP 149/100 | HR 91 | Temp 98.3°F | Wt 204.0 lb

## 2018-12-14 DIAGNOSIS — I10 Essential (primary) hypertension: Secondary | ICD-10-CM | POA: Diagnosis not present

## 2018-12-14 DIAGNOSIS — Z79899 Other long term (current) drug therapy: Secondary | ICD-10-CM

## 2018-12-14 DIAGNOSIS — Z23 Encounter for immunization: Secondary | ICD-10-CM | POA: Diagnosis not present

## 2018-12-14 DIAGNOSIS — B20 Human immunodeficiency virus [HIV] disease: Secondary | ICD-10-CM | POA: Diagnosis not present

## 2018-12-14 DIAGNOSIS — B009 Herpesviral infection, unspecified: Secondary | ICD-10-CM

## 2018-12-14 MED ORDER — BICTEGRAVIR-EMTRICITAB-TENOFOV 50-200-25 MG PO TABS: 1.0000 | ORAL_TABLET | Freq: Every day | ORAL | 6 refills | Status: DC

## 2018-12-14 MED ORDER — BICTEGRAVIR-EMTRICITAB-TENOFOV 50-200-25 MG PO TABS: 1.0000 | ORAL_TABLET | Freq: Every day | ORAL | 0 refills | Status: DC

## 2018-12-14 MED ORDER — VALACYCLOVIR HCL 1 G PO TABS: 1000.0000 mg | ORAL_TABLET | Freq: Every day | ORAL | 5 refills | Status: DC

## 2018-12-14 NOTE — Progress Notes (Signed)
[name], male   DOB: July 21, 1966, 53 y.o.   MRN: [id number]  HPI Colin Alexander is a 53yo M with hiv disease, previously well controlled but now Off of meds for 3 months.  Biktarvy. Cd 4 count of 330 and vl 8,900. Has not have new partners. Though he tries to be companions/ friends to others.  no recent health problems  Outpatient Encounter Medications as of 12/14/2018  Medication Sig  . amLODipine (NORVASC) 10 MG tablet TAKE 1 TABLET BY MOUTH DAILY  . hydrochlorothiazide (HYDRODIURIL) 25 MG tablet TAKE 1 TABLET(25 MG) BY MOUTH DAILY  . valACYclovir (VALTREX) 1000 MG tablet TAKE 1 TABLET BY MOUTH DAILY  . amitriptyline (ELAVIL) 25 MG tablet Take 1 tablet (25 mg total) by mouth at bedtime. Start with 1/2 tab at bedtime x 8 days, then increase to 1 tab at night  . bictegravir-emtricitabine-tenofovir AF (BIKTARVY) 50-200-25 MG TABS tablet Take 1 tablet by mouth daily. (Patient not taking: Reported on 12/14/2018)  . doxycycline (VIBRA-TABS) 100 MG tablet Take 1 tablet (100 mg total) by mouth 2 (two) times daily. (Patient not taking: Reported on 12/14/2018)   No facility-administered encounter medications on file as of 12/14/2018.      Patient Active Problem List   Diagnosis Date Noted  . Herpes genitalis   . Bronchitis 10/19/2012  . Atypical chest pain 10/18/2012  . Family history of premature CAD 10/18/2012  . Chest pain 10/17/2012  . HSV (herpes simplex virus) anogenital infection 06/07/2012  . HIV (human immunodeficiency virus infection) ([place]) 01/13/2012  . HTN (hypertension) 12/31/2011     Health Maintenance Due  Topic Date Due  . COLONOSCOPY  11/26/2014  . INFLUENZA VACCINE  06/10/2018     Review of Systems Review of Systems  Constitutional: Negative for fever, chills, diaphoresis, activity change, appetite change, fatigue and unexpected weight change.  HENT: Negative for congestion, sore throat, rhinorrhea, sneezing, trouble swallowing and sinus pressure.  Eyes:  Negative for photophobia and visual disturbance.  Respiratory: Negative for cough, chest tightness, shortness of breath, wheezing and stridor.  Cardiovascular: Negative for chest pain, palpitations and leg swelling.  Gastrointestinal: Negative for nausea, vomiting, abdominal pain, diarrhea, constipation, blood in stool, abdominal distention and anal bleeding.  Genitourinary: Negative for dysuria, hematuria, flank pain and difficulty urinating.  Musculoskeletal: Negative for myalgias, back pain, joint swelling, arthralgias and gait problem.  Skin: Negative for color change, pallor, rash and wound.  Neurological: Negative for dizziness, tremors, weakness and light-headedness.  Hematological: Negative for adenopathy. Does not bruise/bleed easily.  Psychiatric/Behavioral: Negative for behavioral problems, confusion, sleep disturbance, dysphoric mood, decreased concentration and agitation.    Physical Exam   BP (!) 149/100   Pulse 91   Temp 98.3 F (36.8 C)   Wt 204 lb (92.5 kg)   BMI 31.02 kg/m   Physical Exam  Constitutional: He is oriented to person, place, and time. He appears well-developed and well-nourished. No distress.  HENT:  Mouth/Throat: Oropharynx is clear and moist. No oropharyngeal exudate.  Cardiovascular: Normal rate, regular rhythm and normal heart sounds. Exam reveals no gallop and no friction rub.  No murmur heard.  Pulmonary/Chest: Effort normal and breath sounds normal. No respiratory distress. He has no wheezes.  Abdominal: Soft. Bowel sounds are normal. He exhibits no distension. There is no tenderness.  Lymphadenopathy:  He has no cervical adenopathy.  Neurological: He is alert and oriented to person, place, and time.  Skin: Skin is warm and dry. No rash noted.  No erythema.  Psychiatric: He has a normal mood and affect. His behavior is normal.    Lab Results  Component Value Date   CD4TCELL 20 (L) 12/02/2018   Lab Results  Component Value Date   CD4TABS  330 (L) 12/02/2018   CD4TABS 450 11/12/2017   CD4TABS 490 07/01/2017   Lab Results  Component Value Date   HIV1RNAQUANT 8,790 (H) 12/02/2018   Lab Results  Component Value Date   HEPBSAB POS (A) 01/17/2014   Lab Results  Component Value Date   LABRPR NON-REACTIVE 12/02/2018    CBC Lab Results  Component Value Date   WBC 4.5 12/02/2018   RBC 4.70 12/02/2018   HGB 15.7 12/02/2018   HCT 44.7 12/02/2018   PLT 238 12/02/2018   MCV 95.1 12/02/2018   MCH 33.4 (H) 12/02/2018   MCHC 35.1 12/02/2018   RDW 11.3 12/02/2018   LYMPHSABS 1,580 12/02/2018   MONOABS 522 07/01/2017   EOSABS 212 12/02/2018    BMET Lab Results  Component Value Date   NA 139 12/02/2018   K 3.7 12/02/2018   CL 102 12/02/2018   CO2 29 12/02/2018   GLUCOSE 75 12/02/2018   BUN 10 12/02/2018   CREATININE 0.97 12/02/2018   CALCIUM 9.3 12/02/2018   GFRNONAA 88 12/02/2018   GFRAA 102 12/02/2018      Assessment and Plan  hiv = will restarted biktarvy  Health miantenance = will give prevnar. And will do sti at next visit in 4 wk  rtc 4 wk  htn =poorly controlled. Still getting managed by his new pcp

## 2018-12-16 ENCOUNTER — Encounter: Payer: BC Managed Care – PPO | Admitting: Internal Medicine

## 2018-12-23 ENCOUNTER — Other Ambulatory Visit: Payer: Self-pay | Admitting: Internal Medicine

## 2018-12-23 DIAGNOSIS — B20 Human immunodeficiency virus [HIV] disease: Secondary | ICD-10-CM

## 2019-01-17 ENCOUNTER — Encounter: Payer: Self-pay | Admitting: *Deleted

## 2019-01-17 ENCOUNTER — Encounter: Payer: Self-pay | Admitting: Internal Medicine

## 2019-01-17 ENCOUNTER — Ambulatory Visit (INDEPENDENT_AMBULATORY_CARE_PROVIDER_SITE_OTHER): Payer: BC Managed Care – PPO | Admitting: Internal Medicine

## 2019-01-17 VITALS — BP 135/98 | HR 94 | Temp 98.3°F | Wt 200.0 lb

## 2019-01-17 DIAGNOSIS — Z113 Encounter for screening for infections with a predominantly sexual mode of transmission: Secondary | ICD-10-CM

## 2019-01-17 DIAGNOSIS — B20 Human immunodeficiency virus [HIV] disease: Secondary | ICD-10-CM | POA: Diagnosis not present

## 2019-01-17 DIAGNOSIS — Z79899 Other long term (current) drug therapy: Secondary | ICD-10-CM | POA: Diagnosis not present

## 2019-01-17 NOTE — Progress Notes (Signed)
RFV: follow up for hiv disease  [name], male   DOB: Jan 24, 1966, 53 y.o.   MRN: [id number]  HPI 53yoM with hiv disease, on biktarvy. Started back on biktarvy at last visit 1 month ago. He has been taking his medications daily. Has been caring for his father.he doesn't report any illnesses  Outpatient Encounter Medications as of 01/17/2019  Medication Sig  . amitriptyline (ELAVIL) 25 MG tablet Take 1 tablet (25 mg total) by mouth at bedtime. Start with 1/2 tab at bedtime x 8 days, then increase to 1 tab at night  . amLODipine (NORVASC) 10 MG tablet TAKE 1 TABLET BY MOUTH DAILY  . BIKTARVY 50-200-25 MG TABS tablet TAKE 1 TABLET BY MOUTH DAILY.  Mar[facility] Kitchen doxycycline (VIBRA-TABS) 100 MG tablet Take 1 tablet (100 mg total) by mouth 2 (two) times daily. (Patient not taking: Reported on 12/14/2018)  . hydrochlorothiazide (HYDRODIURIL) 25 MG tablet TAKE 1 TABLET(25 MG) BY MOUTH DAILY  . valACYclovir (VALTREX) 1000 MG tablet Take 1 tablet (1,000 mg total) by mouth daily.   No facility-administered encounter medications on file as of 01/17/2019.      Patient Active Problem List   Diagnosis Date Noted  . Herpes genitalis   . Bronchitis 10/19/2012  . Atypical chest pain 10/18/2012  . Family history of premature CAD 10/18/2012  . Chest pain 10/17/2012  . HSV (herpes simplex virus) anogenital infection 06/07/2012  . HIV (human immunodeficiency virus infection) ([place]) 01/13/2012  . HTN (hypertension) 12/31/2011     Health Maintenance Due  Topic Date Due  . COLONOSCOPY  11/26/2014    Social History   Tobacco Use  . Smoking status: Never Smoker  . Smokeless tobacco: Never Used  Substance Use Topics  . Alcohol use: No    Alcohol/week: 0.0 standard drinks  . Drug use: No    Review of Systems Review of Systems  Constitutional: Negative for fever, chills, diaphoresis, activity change, appetite change, fatigue and unexpected weight change.  HENT: Negative for congestion, sore  throat, rhinorrhea, sneezing, trouble swallowing and sinus pressure.  Eyes: Negative for photophobia and visual disturbance.  Respiratory: Negative for cough, chest tightness, shortness of breath, wheezing and stridor.  Cardiovascular: Negative for chest pain, palpitations and leg swelling.  Gastrointestinal: Negative for nausea, vomiting, abdominal pain, diarrhea, constipation, blood in stool, abdominal distention and anal bleeding.  Genitourinary: Negative for dysuria, hematuria, flank pain and difficulty urinating.  Musculoskeletal: Negative for myalgias, back pain, joint swelling, arthralgias and gait problem.  Skin: Negative for color change, pallor, rash and wound.  Neurological: Negative for dizziness, tremors, weakness and light-headedness.  Hematological: Negative for adenopathy. Does not bruise/bleed easily.  Psychiatric/Behavioral: Negative for behavioral problems, confusion, sleep disturbance, dysphoric mood, decreased concentration and agitation.    Physical Exam   BP (!) 135/98   Pulse 94   Temp 98.3 F (36.8 C) (Oral)   Wt 200 lb (90.7 kg)   BMI 30.41 kg/m  Physical Exam  Constitutional: He is oriented to person, place, and time. He appears well-developed and well-nourished. No distress.  HENT:  Mouth/Throat: Oropharynx is clear and moist. No oropharyngeal exudate.  Cardiovascular: Normal rate, regular rhythm and normal heart sounds. Exam reveals no gallop and no friction rub.  No murmur heard.  Pulmonary/Chest: Effort normal and breath sounds normal. No respiratory distress. He has no wheezes.   Lymphadenopathy:  He has no cervical adenopathy.  Neurological: He is alert and oriented to person, place, and time.  Skin: Skin  is warm and dry. No rash noted. No erythema.  Psychiatric: He has a normal mood and affect. His behavior is normal.     Lab Results  Component Value Date   CD4TCELL 20 (L) 12/02/2018   Lab Results  Component Value Date   CD4TABS 330 (L)  12/02/2018   CD4TABS 450 11/12/2017   CD4TABS 490 07/01/2017   Lab Results  Component Value Date   HIV1RNAQUANT 8,790 (H) 12/02/2018   Lab Results  Component Value Date   HEPBSAB POS (A) 01/17/2014   Lab Results  Component Value Date   LABRPR NON-REACTIVE 12/02/2018    CBC Lab Results  Component Value Date   WBC 4.5 12/02/2018   RBC 4.70 12/02/2018   HGB 15.7 12/02/2018   HCT 44.7 12/02/2018   PLT 238 12/02/2018   MCV 95.1 12/02/2018   MCH 33.4 (H) 12/02/2018   MCHC 35.1 12/02/2018   RDW 11.3 12/02/2018   LYMPHSABS 1,580 12/02/2018   MONOABS 522 07/01/2017   EOSABS 212 12/02/2018    BMET Lab Results  Component Value Date   NA 139 12/02/2018   K 3.7 12/02/2018   CL 102 12/02/2018   CO2 29 12/02/2018   GLUCOSE 75 12/02/2018   BUN 10 12/02/2018   CREATININE 0.97 12/02/2018   CALCIUM 9.3 12/02/2018   GFRNONAA 88 12/02/2018   GFRAA 102 12/02/2018      Assessment and Plan  hiv disease= has increased VL since he reports being out of medication. He has since restarted - plan to recheck his VL once on meds for at least 4-6 wk  Health maintenance = plan to check for sti  Long term medication management = cr stable, plan to continue with biktarvy

## 2019-01-19 LAB — HIV-1 RNA QUANT-NO REFLEX-BLD
HIV 1 RNA Quant: <20 copies/mL
HIV-1 RNA QUANT, LOG: NOT DETECTED {Log_copies}/mL

## 2019-04-19 ENCOUNTER — Telehealth: Payer: Self-pay | Admitting: Internal Medicine

## 2019-04-19 NOTE — Telephone Encounter (Signed)
COVID-19 Pre-Screening Questions:04/19/19 Do you currently have a fever (>100 F), chills or unexplained body aches? NO  Are you currently experiencing new cough, shortness of breath, sore throat, runny nose? NO   Have you recently travelled outside the state of [place] in the last 14 days? NO   Have you been in contact with someone that is currently pending confirmation of Covid19 testing or has been confirmed to have the [place] virus?  NO   **If the patient answers NO to ALL questions -  advise the patient to please call the clinic before coming to the office should any symptoms develop.

## 2019-04-20 ENCOUNTER — Other Ambulatory Visit: Payer: Self-pay

## 2019-04-20 ENCOUNTER — Ambulatory Visit: Payer: BC Managed Care – PPO | Admitting: Internal Medicine

## 2019-04-20 ENCOUNTER — Encounter: Payer: Self-pay | Admitting: Internal Medicine

## 2019-04-20 VITALS — BP 153/98 | HR 72 | Temp 98.0°F | Ht 68.0 in | Wt 197.0 lb

## 2019-04-20 DIAGNOSIS — Z23 Encounter for immunization: Secondary | ICD-10-CM | POA: Diagnosis not present

## 2019-04-20 DIAGNOSIS — B20 Human immunodeficiency virus [HIV] disease: Secondary | ICD-10-CM

## 2019-04-20 DIAGNOSIS — I10 Essential (primary) hypertension: Secondary | ICD-10-CM

## 2019-04-20 DIAGNOSIS — Z79899 Other long term (current) drug therapy: Secondary | ICD-10-CM | POA: Diagnosis not present

## 2019-04-20 NOTE — Progress Notes (Signed)
RFV: follow up for hiv disease  [name], male   DOB: 1966-07-29, 53 y.o.   MRN: [id number]  HPI Colin Alexander is a 71yoM with hiv disease, CD 4 count 330/VL<28 January 2019. Doing well on biktarvy not missing dose. Continues on hsv proph with valtex.  Has been shelter in place. Cares for his elderly father and now son back from college.  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    Outpatient Encounter Medications as of 04/20/2019  Medication Sig  . amitriptyline (ELAVIL) 25 MG tablet Take 1 tablet (25 mg total) by mouth at bedtime. Start with 1/2 tab at bedtime x 8 days, then increase to 1 tab at night  . amLODipine (NORVASC) 10 MG tablet TAKE 1 TABLET BY MOUTH DAILY  . BIKTARVY 50-200-25 MG TABS tablet TAKE 1 TABLET BY MOUTH DAILY.  . hydrochlorothiazide (HYDRODIURIL) 25 MG tablet TAKE 1 TABLET(25 MG) BY MOUTH DAILY  . lisinopril (ZESTRIL) 10 MG tablet Take 1 tablet by mouth daily.  . valACYclovir (VALTREX) 1000 MG tablet Take 1 tablet (1,000 mg total) by mouth daily.   No facility-administered encounter medications on file as of 04/20/2019.      Patient Active Problem List   Diagnosis Date Noted  . Herpes genitalis   . Bronchitis 10/19/2012  . Atypical chest pain 10/18/2012  . Family history of premature CAD 10/18/2012  . Chest pain 10/17/2012  . HSV (herpes simplex virus) anogenital infection 06/07/2012  . HIV (human immunodeficiency virus infection) ([place]) 01/13/2012  . HTN (hypertension) 12/31/2011     Health Maintenance Due  Topic Date Due  . COLONOSCOPY  11/26/2014    Social History   Tobacco Use  . Smoking status: Never Smoker  . Smokeless tobacco: Never Used  Substance Use Topics  . Alcohol use: No    Alcohol/week: 0.0 standard drinks  . Drug use: No   Review of Systems Review of Systems  Constitutional: Negative for fever, chills, diaphoresis, activity change, appetite change, fatigue and unexpected weight  change.  HENT: Negative for congestion, sore throat, rhinorrhea, sneezing, trouble swallowing and sinus pressure.  Eyes: Negative for photophobia and visual disturbance.  Respiratory: Negative for cough, chest tightness, shortness of breath, wheezing and stridor.  Cardiovascular: Negative for chest pain, palpitations and leg swelling.  Gastrointestinal: Negative for nausea, vomiting, abdominal pain, diarrhea, constipation, blood in stool, abdominal distention and anal bleeding.  Genitourinary: Negative for dysuria, hematuria, flank pain and difficulty urinating.  Musculoskeletal: Negative for myalgias, back pain, joint swelling, arthralgias and gait problem.  Skin: Negative for color change, pallor, rash and wound.  Neurological: Negative for dizziness, tremors, weakness and light-headedness.  Hematological: Negative for adenopathy. Does not bruise/bleed easily.  Psychiatric/Behavioral: Negative for behavioral problems, confusion, sleep disturbance, dysphoric mood, decreased concentration and agitation.    Physical Exam   BP (!) 153/98   Pulse 72   Temp 98 F (36.7 C)   Ht 5\' 8"  (1.727 m)   Wt 197 lb (89.4 kg)   BMI 29.95 kg/m   Physical Exam  Constitutional: He is oriented to person, place, and time. He appears well-developed and well-nourished. No distress.  HENT:  Mouth/Throat: Oropharynx is clear and moist. No oropharyngeal exudate.  Cardiovascular: Normal rate, regular rhythm and normal heart sounds. Exam reveals no gallop and no friction rub.  No murmur heard.  Pulmonary/Chest: Effort normal and breath sounds normal. No respiratory distress. He has no wheezes.  Abdominal: Soft. Bowel sounds  are normal. He exhibits no distension. There is no tenderness.  Lymphadenopathy:  He has no cervical adenopathy.  Neurological: He is alert and oriented to person, place, and time.  Skin: Skin is warm and dry. No rash noted. No erythema.  Psychiatric: He has a normal mood and affect. His  behavior is normal.    Lab Results  Component Value Date   CD4TCELL 20 (L) 12/02/2018   Lab Results  Component Value Date   CD4TABS 330 (L) 12/02/2018   CD4TABS 450 11/12/2017   CD4TABS 490 07/01/2017   Lab Results  Component Value Date   HIV1RNAQUANT <20 NOT DETECTED 01/17/2019   Lab Results  Component Value Date   HEPBSAB POS (A) 01/17/2014   Lab Results  Component Value Date   LABRPR NON-REACTIVE 12/02/2018    CBC Lab Results  Component Value Date   WBC 4.5 12/02/2018   RBC 4.70 12/02/2018   HGB 15.7 12/02/2018   HCT 44.7 12/02/2018   PLT 238 12/02/2018   MCV 95.1 12/02/2018   MCH 33.4 (H) 12/02/2018   MCHC 35.1 12/02/2018   RDW 11.3 12/02/2018   LYMPHSABS 1,580 12/02/2018   MONOABS 522 07/01/2017   EOSABS 212 12/02/2018    BMET Lab Results  Component Value Date   NA 139 12/02/2018   K 3.7 12/02/2018   CL 102 12/02/2018   CO2 29 12/02/2018   GLUCOSE 75 12/02/2018   BUN 10 12/02/2018   CREATININE 0.97 12/02/2018   CALCIUM 9.3 12/02/2018   GFRNONAA 88 12/02/2018   GFRAA 102 12/02/2018      Assessment and Plan  hiv disease = well controlled, continue on biktarvy. Will check cd 4 count today  htn = above target today, but just took his meds prior to coming to clinic. Otherwise asymptomatic. Continue to follow  Health maintenance = pneumovax today

## 2019-04-21 LAB — T-HELPER CELL (CD4) - (RCID CLINIC ONLY)
CD4 % Helper T Cell: 24 % — ABNORMAL LOW (ref 33–65)
CD4 T Cell Abs: 347 /uL — ABNORMAL LOW (ref 400–1790)

## 2019-04-25 NOTE — Addendum Note (Signed)
Addended by: Eugenia Mcalpine on: 04/25/2019 04:37 PM   Modules accepted: Orders

## 2019-07-25 ENCOUNTER — Ambulatory Visit: Payer: BC Managed Care – PPO | Admitting: Internal Medicine

## 2019-09-02 ENCOUNTER — Other Ambulatory Visit: Payer: Self-pay | Admitting: Internal Medicine

## 2019-09-02 DIAGNOSIS — B009 Herpesviral infection, unspecified: Secondary | ICD-10-CM

## 2019-09-05 ENCOUNTER — Emergency Department (HOSPITAL_COMMUNITY)
Admission: EM | Admit: 2019-09-05 | Discharge: 2019-09-05 | Disposition: A | Discharge status: Home / Self Care | Payer: BC Managed Care – PPO | Attending: Emergency Medicine | Admitting: Emergency Medicine

## 2019-09-05 ENCOUNTER — Emergency Department (HOSPITAL_COMMUNITY): Payer: BC Managed Care – PPO

## 2019-09-05 ENCOUNTER — Other Ambulatory Visit: Payer: Self-pay

## 2019-09-05 ENCOUNTER — Encounter (HOSPITAL_COMMUNITY): Payer: Self-pay | Admitting: Emergency Medicine

## 2019-09-05 DIAGNOSIS — R519 Headache, unspecified: Secondary | ICD-10-CM | POA: Diagnosis present

## 2019-09-05 DIAGNOSIS — B20 Human immunodeficiency virus [HIV] disease: Secondary | ICD-10-CM | POA: Insufficient documentation

## 2019-09-05 DIAGNOSIS — R42 Dizziness and giddiness: Secondary | ICD-10-CM | POA: Insufficient documentation

## 2019-09-05 DIAGNOSIS — I160 Hypertensive urgency: Secondary | ICD-10-CM | POA: Insufficient documentation

## 2019-09-05 DIAGNOSIS — I10 Essential (primary) hypertension: Secondary | ICD-10-CM | POA: Insufficient documentation

## 2019-09-05 DIAGNOSIS — Z79899 Other long term (current) drug therapy: Secondary | ICD-10-CM | POA: Diagnosis not present

## 2019-09-05 LAB — CBC WITH DIFFERENTIAL/PLATELET
Abs Immature Granulocytes: 0.04 10*3/uL (ref 0.00–0.07)
Basophils Absolute: 0.1 10*3/uL (ref 0.0–0.1)
Basophils Relative: 1 %
Eosinophils Absolute: 0.2 10*3/uL (ref 0.0–0.5)
Eosinophils Relative: 2 %
HCT: 47.7 % (ref 39.0–52.0)
Hemoglobin: 15.7 g/dL (ref 13.0–17.0)
Immature Granulocytes: 1 %
Lymphocytes Relative: 22 %
Lymphs Abs: 1.8 10*3/uL (ref 0.7–4.0)
MCH: 33.8 pg (ref 26.0–34.0)
MCHC: 32.9 g/dL (ref 30.0–36.0)
MCV: 102.6 fL — ABNORMAL HIGH (ref 80.0–100.0)
Monocytes Absolute: 0.5 10*3/uL (ref 0.1–1.0)
Monocytes Relative: 5 %
Neutro Abs: 5.8 10*3/uL (ref 1.7–7.7)
Neutrophils Relative %: 69 %
Platelets: 244 10*3/uL (ref 150–400)
RBC: 4.65 MIL/uL (ref 4.22–5.81)
RDW: 11.8 % (ref 11.5–15.5)
WBC: 8.3 10*3/uL (ref 4.0–10.5)
nRBC: 0 % (ref 0.0–0.2)

## 2019-09-05 LAB — BASIC METABOLIC PANEL
Anion gap: 11 (ref 5–15)
BUN: 10 mg/dL (ref 6–20)
CO2: 23 mmol/L (ref 22–32)
Calcium: 9.2 mg/dL (ref 8.9–10.3)
Chloride: 104 mmol/L (ref 98–111)
Creatinine, Ser: 0.89 mg/dL (ref 0.61–1.24)
GFR calc Af Amer: >60 mL/min (ref >60)
GFR calc non Af Amer: >60 mL/min (ref >60)
Glucose, Bld: 92 mg/dL (ref 70–99)
Potassium: 3.5 mmol/L (ref 3.5–5.1)
Sodium: 138 mmol/L (ref 135–145)

## 2019-09-05 NOTE — ED Provider Notes (Signed)
[facility] DEPT Provider Note   CSN: [id number] Arrival date & time: 09/05/19  1445     History   Chief Complaint Chief Complaint  Patient presents with  . Hypertension    HPI Colin Alexander is a 53 y.o. male.     Patient is a 53 year old male with history of HIV disease and hypertension.  He presents today for evaluation of headache and elevated blood pressure.  Patient reports having forgotten to take his lisinopril this morning prior to going to work.  While he was at work he developed headache and dizziness.  He went to the nurses station to be evaluated and was told his blood pressure was nearly 200.  He was then referred here for further work-up.  Patient states that he is now feeling somewhat better.  His blood pressure is now in the [id number] systolic.  He denies any numbness or tingling.  He denies any visual disturbances.  The history is provided by the patient.  Hypertension This is a new problem. The current episode started 3 to 5 hours ago. The problem occurs constantly. The problem has been gradually improving. Associated symptoms include headaches. Pertinent negatives include no chest pain. Nothing aggravates the symptoms. Nothing relieves the symptoms. He has tried nothing for the symptoms.    Past Medical History:  Diagnosis Date  . HIV (human immunodeficiency virus infection) ([place])   . Hypertension     Patient Active Problem List   Diagnosis Date Noted  . Herpes genitalis   . Bronchitis 10/19/2012  . Atypical chest pain 10/18/2012  . Family history of premature CAD 10/18/2012  . Chest pain 10/17/2012  . HSV (herpes simplex virus) anogenital infection 06/07/2012  . HIV (human immunodeficiency virus infection) ([place]) 01/13/2012  . HTN (hypertension) 12/31/2011    Past Surgical History:  Procedure Laterality Date  . CIRCUMCISION    . HERNIA REPAIR          Home Medications    Prior to Admission medications   Medication  Sig Start Date End Date Taking? Authorizing Provider  amitriptyline (ELAVIL) 25 MG tablet Take 1 tablet (25 mg total) by mouth at bedtime. Start with 1/2 tab at bedtime x 8 days, then increase to 1 tab at night 04/10/16   Carlyle Basques, MD  amLODipine (NORVASC) 10 MG tablet TAKE 1 TABLET BY MOUTH DAILY 07/08/18   Carlyle Basques, MD  BIKTARVY 50-200-25 MG TABS tablet TAKE 1 TABLET BY MOUTH DAILY. 12/23/18   Carlyle Basques, MD  lisinopril (ZESTRIL) 10 MG tablet Take 1 tablet by mouth daily. 12/05/18   [provider]  valACYclovir (VALTREX) 1000 MG tablet TAKE 1 TABLET(1000 MG) BY MOUTH DAILY 09/02/19   Carlyle Basques, MD    Family History Family History  Problem Relation Age of Onset  . Heart attack Mother        Stent placement  . Hypertension Mother   . Heart attack Father        CABG  . Hypertension Father   . Heart attack Cousin        In his 32's deceased.    Social History Social History   Tobacco Use  . Smoking status: Never Smoker  . Smokeless tobacco: Never Used  Substance Use Topics  . Alcohol use: No    Alcohol/week: 0.0 standard drinks  . Drug use: No     Allergies   Penicillins and Raspberry   Review of Systems Review of Systems  Cardiovascular: Negative for  chest pain.  Neurological: Positive for headaches.  All other systems reviewed and are negative.    Physical Exam Updated Vital Signs BP (!) 155/106   Pulse 67   Temp 98.1 F (36.7 C) (Oral)   Resp 16   Ht 5\' 8"  (1.727 m)   Wt 84.8 kg   SpO2 98%   BMI 28.43 kg/m   Physical Exam Vitals signs and nursing note reviewed.  Constitutional:      General: He is not in acute distress.    Appearance: He is well-developed. He is not diaphoretic.  HENT:     Head: Normocephalic and atraumatic.  Eyes:     Extraocular Movements: Extraocular movements intact.     Pupils: Pupils are equal, round, and reactive to light.  Neck:     Musculoskeletal: Normal range of motion and neck supple.   Cardiovascular:     Rate and Rhythm: Normal rate and regular rhythm.     Heart sounds: No murmur. No friction rub.  Pulmonary:     Effort: Pulmonary effort is normal. No respiratory distress.     Breath sounds: Normal breath sounds. No wheezing or rales.  Abdominal:     General: Bowel sounds are normal. There is no distension.     Palpations: Abdomen is soft.     Tenderness: There is no abdominal tenderness.  Musculoskeletal: Normal range of motion.  Skin:    General: Skin is warm and dry.  Neurological:     General: No focal deficit present.     Mental Status: He is alert and oriented to person, place, and time.     Cranial Nerves: No cranial nerve deficit.     Sensory: No sensory deficit.     Motor: No weakness.     Coordination: Coordination normal.      ED Treatments / Results  Labs (all labs ordered are listed, but only abnormal results are displayed) Labs Reviewed  BASIC METABOLIC PANEL  CBC WITH DIFFERENTIAL/PLATELET    EKG EKG Interpretation  Date/Time:  Monday September 05 2019 15:09:56 EDT Ventricular Rate:  67 PR Interval:    QRS Duration: 90 QT Interval:  418 QTC Calculation: 442 R Axis:   42 Text Interpretation: Sinus rhythm No significant change since last tracing Confirmed by Dorie Rank [phone]) on 09/05/2019 3:14:20 PM   Radiology No results found.  Procedures Procedures (including critical care time)  Medications Ordered in ED Medications - No data to display   Initial Impression / Assessment and Plan / ED Course  I have reviewed the triage vital signs and the nursing notes.  Pertinent labs & imaging results that were available during my care of the patient were reviewed by me and considered in my medical decision making (see chart for details).  Patient presenting here with headache and elevated blood pressure as described in the HPI.  He is neurologically intact and CT scan is unremarkable.  Laboratory studies are also unremarkable.  At  this point, I feel as though patient is appropriate for discharge.  He is to resume taking his blood pressure medication as prescribed, keep a record of his blood pressures at home, and follow-up with primary doctor/return for any problems.  Final Clinical Impressions(s) / ED Diagnoses   Final diagnoses:  None    ED Discharge Orders    None       Veryl Speak, MD 09/05/19 [phone]

## 2019-09-05 NOTE — ED Notes (Signed)
Patient given water

## 2019-09-05 NOTE — ED Triage Notes (Signed)
Pt verbalizes felt off at lunch and threw up. Pt verbalizes "I knew I felt off because I didn't have my blood pressure medicine before I left." Pt denies weakness or numbness but "I have a nagging headache."

## 2019-09-05 NOTE — Discharge Instructions (Addendum)
Resume your lisinopril as previously prescribed.  Keep a record of your blood pressures at home and take this with you to your next doctor's appointment.  Return to the emergency department in the meantime if you develop worsening headache, chest pain, or other new and concerning symptoms.

## 2019-09-08 ENCOUNTER — Other Ambulatory Visit: Payer: BC Managed Care – PPO

## 2019-09-08 ENCOUNTER — Other Ambulatory Visit: Payer: Self-pay

## 2019-09-08 DIAGNOSIS — A609 Anogenital herpesviral infection, unspecified: Secondary | ICD-10-CM

## 2019-09-08 DIAGNOSIS — B20 Human immunodeficiency virus [HIV] disease: Secondary | ICD-10-CM

## 2019-09-12 ENCOUNTER — Other Ambulatory Visit: Payer: Self-pay | Admitting: Internal Medicine

## 2019-09-12 DIAGNOSIS — B009 Herpesviral infection, unspecified: Secondary | ICD-10-CM

## 2019-09-21 ENCOUNTER — Telehealth: Payer: Self-pay

## 2019-09-21 NOTE — Telephone Encounter (Signed)
COVID-19 Pre-Screening Questions:  Do you currently have a fever (>100 F), chills or unexplained body aches? NO   Are you currently experiencing new cough, shortness of breath, sore throat, runny nose?NO .  Have you recently travelled outside the state of [place] in the last 14 days? NO .  Have you been in contact with someone that is currently pending confirmation of Covid19 testing or has been confirmed to have the Covid19 virus?  NO  **If the patient answers NO to ALL questions -  advise the patient to please call the clinic before coming to the office should any symptoms develop.     

## 2019-09-22 ENCOUNTER — Other Ambulatory Visit (HOSPITAL_COMMUNITY)
Admission: RE | Admit: 2019-09-22 | Discharge: 2019-09-22 | Disposition: A | Discharge status: Home / Self Care | Payer: BC Managed Care – PPO | Source: Ambulatory Visit | Attending: Internal Medicine | Admitting: Internal Medicine

## 2019-09-22 ENCOUNTER — Ambulatory Visit: Payer: BC Managed Care – PPO | Admitting: Internal Medicine

## 2019-09-22 ENCOUNTER — Encounter: Payer: Self-pay | Admitting: Internal Medicine

## 2019-09-22 ENCOUNTER — Other Ambulatory Visit: Payer: Self-pay

## 2019-09-22 VITALS — BP 158/112 | HR 73 | Temp 98.4°F | Wt 205.0 lb

## 2019-09-22 DIAGNOSIS — Z79899 Other long term (current) drug therapy: Secondary | ICD-10-CM

## 2019-09-22 DIAGNOSIS — B20 Human immunodeficiency virus [HIV] disease: Secondary | ICD-10-CM | POA: Diagnosis present

## 2019-09-22 DIAGNOSIS — I10 Essential (primary) hypertension: Secondary | ICD-10-CM

## 2019-09-22 NOTE — Progress Notes (Signed)
RFV: follow up for hiv disease  [name], male   DOB: May 31, 1966, 53 y.o.   MRN: [id number]  HPI ED visit symptomatic hypertension. He is worried his bp is elevated. He is otherwise healthy. He has been wearing masks. Not coming in contact with covid +, works at [employer] and [employer]. Not missing doses of his ART. occ headache Outpatient Encounter Medications as of 09/22/2019  Medication Sig  . BIKTARVY 50-200-25 MG TABS tablet TAKE 1 TABLET BY MOUTH DAILY.  . fluticasone (FLONASE) 50 MCG/ACT nasal spray Place 2 sprays into both nostrils daily.  Marland Kitchen lisinopril (ZESTRIL) 10 MG tablet Take 1 tablet by mouth daily.  . valACYclovir (VALTREX) 1000 MG tablet TAKE 1 TABLET(1000 MG) BY MOUTH DAILY  . amitriptyline (ELAVIL) 25 MG tablet Take 1 tablet (25 mg total) by mouth at bedtime. Start with 1/2 tab at bedtime x 8 days, then increase to 1 tab at night  . amLODipine (NORVASC) 10 MG tablet TAKE 1 TABLET BY MOUTH DAILY  . clindamycin (CLEOCIN) 300 MG capsule Take 300 mg by mouth 4 (four) times daily.  . [DISCONTINUED] lisinopril (ZESTRIL) 20 MG tablet Take 20 mg by mouth daily.   No facility-administered encounter medications on file as of 09/22/2019.      Patient Active Problem List   Diagnosis Date Noted  . Herpes genitalis   . Bronchitis 10/19/2012  . Atypical chest pain 10/18/2012  . Family history of premature CAD 10/18/2012  . Chest pain 10/17/2012  . HSV (herpes simplex virus) anogenital infection 06/07/2012  . HIV (human immunodeficiency virus infection) ([place]) 01/13/2012  . HTN (hypertension) 12/31/2011     Health Maintenance Due  Topic Date Due  . COLONOSCOPY  11/26/2014  . INFLUENZA VACCINE  06/11/2019    Social History   Tobacco Use  . Smoking status: Never Smoker  . Smokeless tobacco: Never Used  Substance Use Topics  . Alcohol use: No    Alcohol/week: 0.0 standard drinks  . Drug use: No   Review of Systems 12 point ros reviewed except what is mentioned in  hpi Physical Exam   BP (!) 158/112   Pulse 73   Temp 98.4 F (36.9 C) (Oral)   Wt 205 lb (93 kg)   BMI 31.17 kg/m   Physical Exam  Constitutional: He is oriented to person, place, and time. He appears well-developed and well-nourished. No distress.  HENT:  Mouth/Throat: Oropharynx is clear and moist. No oropharyngeal exudate.  Cardiovascular: Normal rate, regular rhythm and normal heart sounds. Exam reveals no gallop and no friction rub.  No murmur heard.  Pulmonary/Chest: Effort normal and breath sounds normal. No respiratory distress. He has no wheezes.  Abdominal: Soft. Bowel sounds are normal. He exhibits no distension. There is no tenderness.  Lymphadenopathy:  He has no cervical adenopathy.  Neurological: He is alert and oriented to person, place, and time.  Skin: Skin is warm and dry. No rash noted. No erythema.  Psychiatric: He has a normal mood and affect. His behavior is normal.    Lab Results  Component Value Date   CD4TCELL 24 (L) 04/20/2019   Lab Results  Component Value Date   CD4TABS 347 (L) 04/20/2019   CD4TABS 330 (L) 12/02/2018   CD4TABS 450 11/12/2017   Lab Results  Component Value Date   HIV1RNAQUANT <20 NOT DETECTED 01/17/2019   Lab Results  Component Value Date   HEPBSAB POS (A) 01/17/2014   Lab Results  Component Value Date  LABRPR NON-REACTIVE 12/02/2018    CBC Lab Results  Component Value Date   WBC 8.3 09/05/2019   RBC 4.65 09/05/2019   HGB 15.7 09/05/2019   HCT 47.7 09/05/2019   PLT 244 09/05/2019   MCV 102.6 (H) 09/05/2019   MCH 33.8 09/05/2019   MCHC 32.9 09/05/2019   RDW 11.8 09/05/2019   LYMPHSABS 1.8 09/05/2019   MONOABS 0.5 09/05/2019   EOSABS 0.2 09/05/2019    BMET Lab Results  Component Value Date   NA 138 09/05/2019   K 3.5 09/05/2019   CL 104 09/05/2019   CO2 23 09/05/2019   GLUCOSE 92 09/05/2019   BUN 10 09/05/2019   CREATININE 0.89 09/05/2019   CALCIUM 9.2 09/05/2019   GFRNONAA >60 09/05/2019   GFRAA  >60 09/05/2019      Assessment and Plan Poorly controlled htn = appears only on lisinopril 10mg . Will have him increase to 20mg  and call in his BP recordings. If still elevated, will place on amlodipine as well.   hiv disease = will check  [facility]. Refill his ART  Health maintenance = already had flu shot

## 2019-09-23 ENCOUNTER — Other Ambulatory Visit: Payer: Self-pay | Admitting: Internal Medicine

## 2019-09-23 DIAGNOSIS — B20 Human immunodeficiency virus [HIV] disease: Secondary | ICD-10-CM

## 2019-09-23 LAB — URINALYSIS
Bilirubin Urine: NEGATIVE
Glucose, UA: NEGATIVE
Ketones, ur: NEGATIVE
Leukocytes,Ua: NEGATIVE
Nitrite: NEGATIVE
Protein, ur: NEGATIVE
Specific Gravity, Urine: 1.014 (ref 1.001–1.03)
pH: 6 (ref 5.0–8.0)

## 2019-09-23 LAB — T-HELPER CELL (CD4) - (RCID CLINIC ONLY)
CD4 % Helper T Cell: 29 % — ABNORMAL LOW (ref 33–65)
CD4 T Cell Abs: 589 /uL (ref 400–1790)

## 2019-09-26 ENCOUNTER — Other Ambulatory Visit: Payer: Self-pay

## 2019-09-26 ENCOUNTER — Ambulatory Visit: Payer: BC Managed Care – PPO | Admitting: *Deleted

## 2019-09-26 VITALS — BP 180/117 | HR 75

## 2019-09-26 DIAGNOSIS — I10 Essential (primary) hypertension: Secondary | ICD-10-CM

## 2019-09-26 LAB — URINE CYTOLOGY ANCILLARY ONLY
Chlamydia: NEGATIVE
Comment: NEGATIVE
Comment: NORMAL
Neisseria Gonorrhea: NEGATIVE

## 2019-09-26 NOTE — Progress Notes (Signed)
Patient reports that he has had headaches and dizziness for the past 2 weeks. He also had pain (7) bilaterally in his lower back with no injury. He kept a BP log since his visit 09/22/19  Thursday 09/22/19 193/122  Friday AM 193/116            PM 150-102 Saturday AM 157/110     PM 182/106 Sunday AM 160/104   PM 145/94 Monday AM 159/107   PM 180/117 (at clinic)  He reports the he has been taking double dose of Lisinopril as asked at visit but he never got the HCTZ.   Will forward information to provider and get back to patient as soon as possible

## 2019-09-27 MED ORDER — LISINOPRIL 20 MG PO TABS: 20.0000 mg | ORAL_TABLET | Freq: Every day | ORAL | 11 refills | Status: DC

## 2019-09-27 MED ORDER — AMLODIPINE BESYLATE 10 MG PO TABS: 10.0000 mg | ORAL_TABLET | Freq: Every day | ORAL | 11 refills | Status: DC

## 2019-09-28 ENCOUNTER — Telehealth: Payer: Self-pay | Admitting: *Deleted

## 2019-09-28 NOTE — Telephone Encounter (Signed)
It takes about 3 doses before noticing decrease. Let him know I will call him on friday

## 2019-09-28 NOTE — Telephone Encounter (Signed)
Patient has been taking lisinopril 20 mg daily, added amlodipine 10 mg daily today, 0700.  His blood pressure this morning was 160/103.  He wanted to know when he could expect his blood pressure to lower on the new medication. RN advised patient to give it a few days, to make low-salt dietary choices as well. He will continue to track his blood pressure readings, RN will follow up with him next week.  Patient advised to go to the emergency room if he develops severe headache or changes in vision. He agreed. Landis Gandy, RN

## 2019-09-29 NOTE — Telephone Encounter (Signed)
RN reached out to patient, relayed that Dr Baxter Flattery stated it would take 3 doses to make him feel better.  Patient stated he was already feeling much better today.

## 2019-09-30 ENCOUNTER — Telehealth: Payer: Self-pay | Admitting: *Deleted

## 2019-09-30 NOTE — Telephone Encounter (Signed)
Patient called to let Dr Baxter Flattery know that he was notified by his employer that he was exposed to [place] by a coworker. He is asymptomatic at this time, is quarantining and will be tested at work Monday.   Regarding his blood pressure, he states he is feeling better, although his numbers were still high this morning. He will recheck later today. Landis Gandy, RN

## 2019-10-02 LAB — CBC WITH DIFFERENTIAL/PLATELET
Absolute Monocytes: 510 cells/uL (ref 200–950)
Basophils Absolute: 88 cells/uL (ref 0–200)
Basophils Relative: 1.4 %
Eosinophils Absolute: 309 cells/uL (ref 15–500)
Eosinophils Relative: 4.9 %
HCT: 44.6 % (ref 38.5–50.0)
Hemoglobin: 15.1 g/dL (ref 13.2–17.1)
Lymphs Abs: 2041 cells/uL (ref 850–3900)
MCH: 33.9 pg — ABNORMAL HIGH (ref 27.0–33.0)
MCHC: 33.9 g/dL (ref 32.0–36.0)
MCV: 100 fL (ref 80.0–100.0)
MPV: 10.7 fL (ref 7.5–12.5)
Monocytes Relative: 8.1 %
Neutro Abs: 3352 cells/uL (ref 1500–7800)
Neutrophils Relative %: 53.2 %
Platelets: 235 10*3/uL (ref 140–400)
RBC: 4.46 10*6/uL (ref 4.20–5.80)
RDW: 12 % (ref 11.0–15.0)
Total Lymphocyte: 32.4 %
WBC: 6.3 10*3/uL (ref 3.8–10.8)

## 2019-10-02 LAB — HIV-1 RNA QUANT-NO REFLEX-BLD
HIV 1 RNA Quant: <20 copies/mL — AB
HIV-1 RNA Quant, Log: <1.3 Log copies/mL — AB

## 2019-10-02 LAB — COMPLETE METABOLIC PANEL WITH GFR
AG Ratio: 1.6 (calc) (ref 1.0–2.5)
ALT: 21 U/L (ref 9–46)
AST: 15 U/L (ref 10–35)
Albumin: 4.3 g/dL (ref 3.6–5.1)
Alkaline phosphatase (APISO): 40 U/L (ref 35–144)
BUN: 15 mg/dL (ref 7–25)
CO2: 29 mmol/L (ref 20–32)
Calcium: 9.7 mg/dL (ref 8.6–10.3)
Chloride: 103 mmol/L (ref 98–110)
Creat: 1.05 mg/dL (ref 0.70–1.33)
GFR, Est African American: 93 mL/min/{1.73_m2} (ref >=60)
GFR, Est Non African American: 80 mL/min/{1.73_m2} (ref >=60)
Globulin: 2.7 g/dL (calc) (ref 1.9–3.7)
Glucose, Bld: 90 mg/dL (ref 65–99)
Potassium: 3.7 mmol/L (ref 3.5–5.3)
Sodium: 139 mmol/L (ref 135–146)
Total Bilirubin: 0.7 mg/dL (ref 0.2–1.2)
Total Protein: 7 g/dL (ref 6.1–8.1)

## 2019-10-02 LAB — RPR: RPR Ser Ql: NONREACTIVE

## 2019-11-29 ENCOUNTER — Telehealth: Payer: Self-pay

## 2019-11-29 NOTE — Telephone Encounter (Signed)
Patient states he continues to have mild s/sx after testing positive for covid. States he has been in [employer] for 10 days now (works at [employer]) States he does not have any fevers. Still having upset stomach and chills at time with mild body aches.  Denies shortness of breath/cough. Advised patient to remain in quarantine if he is still having s/sx and contact PCP to manage s/sx. Patient agreed to contact PCP if needed.  Eugenia Mcalpine

## 2019-12-20 ENCOUNTER — Ambulatory Visit: Payer: BC Managed Care – PPO | Admitting: Internal Medicine

## 2019-12-28 ENCOUNTER — Ambulatory Visit: Payer: BC Managed Care – PPO | Admitting: Internal Medicine

## 2019-12-28 ENCOUNTER — Other Ambulatory Visit: Payer: Self-pay

## 2019-12-28 ENCOUNTER — Encounter: Payer: Self-pay | Admitting: Internal Medicine

## 2019-12-28 VITALS — BP 137/94 | HR 102 | Wt 201.0 lb

## 2019-12-28 DIAGNOSIS — Z8616 Personal history of COVID-19: Secondary | ICD-10-CM | POA: Diagnosis not present

## 2019-12-28 DIAGNOSIS — B20 Human immunodeficiency virus [HIV] disease: Secondary | ICD-10-CM | POA: Diagnosis not present

## 2019-12-28 DIAGNOSIS — I10 Essential (primary) hypertension: Secondary | ICD-10-CM | POA: Diagnosis not present

## 2019-12-28 NOTE — Progress Notes (Signed)
RFV: follow up for hiv disease  [name], male   DOB: [birth year], 54 y.o.   MRN: [id number]  HPI 54yo M with hiv disease, HTN, seasonal allergies, currently on biktarvy. CD 4 count of 589/VL<20 in Nov 2020 doing well with adherence.  Diagnosed with covid in early January. Going back to work next week. Out of work for 7 wk due to symptoms of covid, lingering fatigue  His father also contracted covid and was hospitalized, now back at home. Has dementia and alzheimer's disease.  Outpatient Encounter Medications as of 12/28/2019  Medication Sig  . amitriptyline (ELAVIL) 25 MG tablet Take 1 tablet (25 mg total) by mouth at bedtime. Start with 1/2 tab at bedtime x 8 days, then increase to 1 tab at night  . amLODipine (NORVASC) 10 MG tablet Take 1 tablet (10 mg total) by mouth daily.  Marland Kitchen BIKTARVY 50-200-25 MG TABS tablet TAKE 1 TABLET BY MOUTH DAILY.  . fluticasone (FLONASE) 50 MCG/ACT nasal spray Place 2 sprays into both nostrils daily.  Marland Kitchen lisinopril (ZESTRIL) 20 MG tablet Take 1 tablet (20 mg total) by mouth daily.  . valACYclovir (VALTREX) 1000 MG tablet TAKE 1 TABLET(1000 MG) BY MOUTH DAILY  . clindamycin (CLEOCIN) 300 MG capsule Take 300 mg by mouth 4 (four) times daily.   No facility-administered encounter medications on file as of 12/28/2019.     Patient Active Problem List   Diagnosis Date Noted  . Herpes genitalis   . Bronchitis 10/19/2012  . Atypical chest pain 10/18/2012  . Family history of premature CAD 10/18/2012  . Chest pain 10/17/2012  . HSV (herpes simplex virus) anogenital infection 06/07/2012  . HIV (human immunodeficiency virus infection) ([place]) 01/13/2012  . HTN (hypertension) 12/31/2011     Health Maintenance Due  Topic Date Due  . COLONOSCOPY  11/26/2014     Review of Systems Review of Systems  Constitutional: no nightsweats. Negative for fever, chills, diaphoresis, activity change, appetite change, fatigue and unexpected weight change.    HENT: Negative for congestion, sore throat, rhinorrhea, sneezing, trouble swallowing and sinus pressure.  Eyes: Negative for photophobia and visual disturbance.  Respiratory: Negative for cough, chest tightness, shortness of breath, wheezing and stridor.  Cardiovascular: Negative for chest pain, palpitations and leg swelling.  Gastrointestinal: Negative for nausea, vomiting, abdominal pain, diarrhea, constipation, blood in stool, abdominal distention and anal bleeding.  Genitourinary: Negative for dysuria, hematuria, flank pain and difficulty urinating.  Musculoskeletal: Negative for myalgias, back pain, joint swelling, arthralgias and gait problem.  Skin: Negative for color change, pallor, rash and wound.  Neurological: Negative for dizziness, tremors, weakness and light-headedness.  Hematological: Negative for adenopathy. Does not bruise/bleed easily.  Psychiatric/Behavioral: now headaches improved  Physical Exam   BP (!) 137/94   Pulse (!) 102   Wt 201 lb (91.2 kg)   BMI 30.56 kg/m   Physical Exam  Constitutional: He is oriented to person, place, and time. He appears well-developed and well-nourished. No distress.  HENT:  Mouth/Throat: Oropharynx is clear and moist. No oropharyngeal exudate.  Cardiovascular: Normal rate, regular rhythm and normal heart sounds. Exam reveals no gallop and no friction rub.  No murmur heard.  Pulmonary/Chest: Effort normal and breath sounds normal. No respiratory distress. He has no wheezes.  Abdominal: Soft. Bowel sounds are normal. He exhibits no distension. There is no tenderness.  Lymphadenopathy:  He has no cervical adenopathy.  Neurological: He is alert and oriented to person, place, and time.  Skin: Skin is  warm and dry. No rash noted. No erythema.  Psychiatric: He has a normal mood and affect. His behavior is normal.    Lab Results  Component Value Date   CD4TCELL 29 (L) 09/22/2019   Lab Results  Component Value Date   CD4TABS 589  09/22/2019   CD4TABS 347 (L) 04/20/2019   CD4TABS 330 (L) 12/02/2018   Lab Results  Component Value Date   HIV1RNAQUANT <20 DETECTED (A) 09/22/2019   Lab Results  Component Value Date   HEPBSAB POS (A) 01/17/2014   Lab Results  Component Value Date   LABRPR NON-REACTIVE 09/22/2019    CBC Lab Results  Component Value Date   WBC 6.3 09/22/2019   RBC 4.46 09/22/2019   HGB 15.1 09/22/2019   HCT 44.6 09/22/2019   PLT 235 09/22/2019   MCV 100.0 09/22/2019   MCH 33.9 (H) 09/22/2019   MCHC 33.9 09/22/2019   RDW 12.0 09/22/2019   LYMPHSABS 2,041 09/22/2019   MONOABS 0.5 09/05/2019   EOSABS 309 09/22/2019    BMET Lab Results  Component Value Date   NA 139 09/22/2019   K 3.7 09/22/2019   CL 103 09/22/2019   CO2 29 09/22/2019   GLUCOSE 90 09/22/2019   BUN 15 09/22/2019   CREATININE 1.05 09/22/2019   CALCIUM 9.7 09/22/2019   GFRNONAA 80 09/22/2019   GFRAA 93 09/22/2019    Assessment and Plan  Recovered from covid disease = now improved, taken some time to improve.   hiv disease = never missed taking biktarvy despite being sick  htn = well controlled. Continue on current medications  Health maintenance = still waiting to get colonoscopy; still recommend get covid vaccine

## 2020-02-20 ENCOUNTER — Other Ambulatory Visit: Payer: Self-pay

## 2020-02-20 DIAGNOSIS — B009 Herpesviral infection, unspecified: Secondary | ICD-10-CM

## 2020-02-20 MED ORDER — VALACYCLOVIR HCL 1 G PO TABS: ORAL_TABLET | ORAL | 5 refills | Status: DC

## 2020-03-21 ENCOUNTER — Telehealth: Payer: Self-pay

## 2020-03-21 NOTE — Telephone Encounter (Signed)
COVID-19 Pre-Screening Questions:03/21/20  Do you currently have a fever (>100 F), chills or unexplained body aches? NO  Are you currently experiencing new cough, shortness of breath, sore throat, runny nose?NO .  Have you recently travelled outside the state of [place] in the last 14 days? N O .  Have you been in contact with someone that is currently pending confirmation of Covid19 testing or has been confirmed to have the Covid19 virus? NO **If the patient answers NO to ALL questions -  advise the patient to please call the clinic before coming to the office should any symptoms develop.     

## 2020-03-22 ENCOUNTER — Ambulatory Visit (INDEPENDENT_AMBULATORY_CARE_PROVIDER_SITE_OTHER): Payer: BC Managed Care – PPO | Admitting: Internal Medicine

## 2020-03-22 ENCOUNTER — Encounter: Payer: Self-pay | Admitting: Internal Medicine

## 2020-03-22 ENCOUNTER — Other Ambulatory Visit: Payer: Self-pay

## 2020-03-22 VITALS — BP 120/87 | HR 88 | Wt 207.0 lb

## 2020-03-22 DIAGNOSIS — Z8616 Personal history of COVID-19: Secondary | ICD-10-CM | POA: Diagnosis not present

## 2020-03-22 DIAGNOSIS — B20 Human immunodeficiency virus [HIV] disease: Secondary | ICD-10-CM | POA: Diagnosis not present

## 2020-03-22 NOTE — Progress Notes (Signed)
RFV: follow up for hiv disease  [name], male   DOB: 03-14-1966, 54 y.o.   MRN: [id number]  HPI 54yo M with well controlled hiv disease, CD 4 count of 580/VL<20(nov 2020) on biktarvy. No missing doses Wrapping up work for [employer]. Going to niece's wedding which he is excited to attend. covid weight +he has noticed over the last 6 months. Overall doing well. Now recovered from covid-19, no sequelae.  2nd dose next week and also get his father's vaccine.  Outpatient Encounter Medications as of 03/22/2020  Medication Sig  . amLODipine (NORVASC) 10 MG tablet Take 1 tablet (10 mg total) by mouth daily.  Marland Kitchen BIKTARVY 50-200-25 MG TABS tablet TAKE 1 TABLET BY MOUTH DAILY.  Marland Kitchen amitriptyline (ELAVIL) 25 MG tablet Take 1 tablet (25 mg total) by mouth at bedtime. Start with 1/2 tab at bedtime x 8 days, then increase to 1 tab at night  . clindamycin (CLEOCIN) 300 MG capsule Take 300 mg by mouth 4 (four) times daily.  . fluticasone (FLONASE) 50 MCG/ACT nasal spray Place 2 sprays into both nostrils daily.  Marland Kitchen lisinopril (ZESTRIL) 20 MG tablet Take 1 tablet (20 mg total) by mouth daily.  . valACYclovir (VALTREX) 1000 MG tablet TAKE 1 TABLET(1000 MG) BY MOUTH DAILY   No facility-administered encounter medications on file as of 03/22/2020.     Patient Active Problem List   Diagnosis Date Noted  . Herpes genitalis   . Bronchitis 10/19/2012  . Atypical chest pain 10/18/2012  . Family history of premature CAD 10/18/2012  . Chest pain 10/17/2012  . HSV (herpes simplex virus) anogenital infection 06/07/2012  . HIV (human immunodeficiency virus infection) ([place]) 01/13/2012  . HTN (hypertension) 12/31/2011     Health Maintenance Due  Topic Date Due  . COVID-19 Vaccine (1) Never done  . COLONOSCOPY  Never done    Social History   Tobacco Use  . Smoking status: Never Smoker  . Smokeless tobacco: Never Used  Substance Use Topics  . Alcohol use: No    Alcohol/week: 0.0 standard  drinks  . Drug use: No   Review of Systems  Constitutional: Negative for fever, chills, diaphoresis, activity change, appetite change, fatigue and unexpected weight change.  HENT: + increase nasal congestion. Negative for congestion, sore throat, rhinorrhea, sneezing, trouble swallowing and sinus pressure.  Eyes: Negative for photophobia and visual disturbance.  Respiratory: Negative for cough, chest tightness, shortness of breath, wheezing and stridor.  Cardiovascular: Negative for chest pain, palpitations and leg swelling.  Gastrointestinal: Negative for nausea, vomiting, abdominal pain, diarrhea, constipation, blood in stool, abdominal distention and anal bleeding.  Genitourinary: Negative for dysuria, hematuria, flank pain and difficulty urinating.  Musculoskeletal: Negative for myalgias, back pain, joint swelling, arthralgias and gait problem.  Skin: Negative for color change, pallor, rash and wound.  Neurological: Negative for dizziness, tremors, weakness and light-headedness.  Hematological: Negative for adenopathy. Does not bruise/bleed easily.  Psychiatric/Behavioral: Negative for behavioral problems, confusion, sleep disturbance, dysphoric mood, decreased concentration and agitation.    Physical Exam   BP 120/87   Pulse 88   Wt 207 lb (93.9 kg)   BMI 31.47 kg/m   Physical Exam  Constitutional: He is oriented to person, place, and time. He appears well-developed and well-nourished. No distress.  HENT:  Mouth/Throat: Oropharynx is clear and moist. No oropharyngeal exudate.  Cardiovascular: Normal rate, regular rhythm and normal heart sounds. Exam reveals no gallop and no friction rub.  No murmur heard.  Pulmonary/Chest: Effort normal and breath sounds normal. No respiratory distress. He has no wheezes.  Abdominal: Soft. Bowel sounds are normal. He exhibits no distension. There is no tenderness.  Lymphadenopathy:  He has no cervical adenopathy.  Neurological: He is alert  and oriented to person, place, and time.  Skin: Skin is warm and dry. No rash noted. No erythema.  Psychiatric: He has a normal mood and affect. His behavior is normal.    Lab Results  Component Value Date   CD4TCELL 29 (L) 09/22/2019   Lab Results  Component Value Date   CD4TABS 589 09/22/2019   CD4TABS 347 (L) 04/20/2019   CD4TABS 330 (L) 12/02/2018   Lab Results  Component Value Date   HIV1RNAQUANT <20 DETECTED (A) 09/22/2019   Lab Results  Component Value Date   HEPBSAB POS (A) 01/17/2014   Lab Results  Component Value Date   LABRPR NON-REACTIVE 09/22/2019    CBC Lab Results  Component Value Date   WBC 6.3 09/22/2019   RBC 4.46 09/22/2019   HGB 15.1 09/22/2019   HCT 44.6 09/22/2019   PLT 235 09/22/2019   MCV 100.0 09/22/2019   MCH 33.9 (H) 09/22/2019   MCHC 33.9 09/22/2019   RDW 12.0 09/22/2019   LYMPHSABS 2,041 09/22/2019   MONOABS 0.5 09/05/2019   EOSABS 309 09/22/2019    BMET Lab Results  Component Value Date   NA 139 09/22/2019   K 3.7 09/22/2019   CL 103 09/22/2019   CO2 29 09/22/2019   GLUCOSE 90 09/22/2019   BUN 15 09/22/2019   CREATININE 1.05 09/22/2019   CALCIUM 9.7 09/22/2019   GFRNONAA 80 09/22/2019   GFRAA 93 09/22/2019    Assessment and Plan  HIV disease= well controlled.need to check 6 months  Long term medication management= cr is stable  Health maintenance = getting vaccinated for covid. Also covid recovered. Getting also evaluated to do teeth extraction "new grill"  Htn= well-controlled.

## 2020-03-23 LAB — T-HELPER CELL (CD4) - (RCID CLINIC ONLY)
CD4 % Helper T Cell: 30 % — ABNORMAL LOW (ref 33–65)
CD4 T Cell Abs: 569 /uL (ref 400–1790)

## 2020-03-24 LAB — CBC WITH DIFFERENTIAL/PLATELET
Absolute Monocytes: 476 cells/uL (ref 200–950)
Basophils Absolute: 82 cells/uL (ref 0–200)
Basophils Relative: 1.2 %
Eosinophils Absolute: 333 cells/uL (ref 15–500)
Eosinophils Relative: 4.9 %
HCT: 43.8 % (ref 38.5–50.0)
Hemoglobin: 15.3 g/dL (ref 13.2–17.1)
Lymphs Abs: 1775 cells/uL (ref 850–3900)
MCH: 34.5 pg — ABNORMAL HIGH (ref 27.0–33.0)
MCHC: 34.9 g/dL (ref 32.0–36.0)
MCV: 98.6 fL (ref 80.0–100.0)
MPV: 10.4 fL (ref 7.5–12.5)
Monocytes Relative: 7 %
Neutro Abs: 4134 cells/uL (ref 1500–7800)
Neutrophils Relative %: 60.8 %
Platelets: 265 10*3/uL (ref 140–400)
RBC: 4.44 10*6/uL (ref 4.20–5.80)
RDW: 11.8 % (ref 11.0–15.0)
Total Lymphocyte: 26.1 %
WBC: 6.8 10*3/uL (ref 3.8–10.8)

## 2020-03-24 LAB — COMPLETE METABOLIC PANEL WITH GFR
AG Ratio: 1.6 (calc) (ref 1.0–2.5)
ALT: 27 U/L (ref 9–46)
AST: 19 U/L (ref 10–35)
Albumin: 4.6 g/dL (ref 3.6–5.1)
Alkaline phosphatase (APISO): 44 U/L (ref 35–144)
BUN: 10 mg/dL (ref 7–25)
CO2: 30 mmol/L (ref 20–32)
Calcium: 9.4 mg/dL (ref 8.6–10.3)
Chloride: 102 mmol/L (ref 98–110)
Creat: 1.04 mg/dL (ref 0.70–1.33)
GFR, Est African American: 93 mL/min/{1.73_m2} (ref >=60)
GFR, Est Non African American: 80 mL/min/{1.73_m2} (ref >=60)
Globulin: 2.8 g/dL (calc) (ref 1.9–3.7)
Glucose, Bld: 90 mg/dL (ref 65–99)
Potassium: 3.5 mmol/L (ref 3.5–5.3)
Sodium: 138 mmol/L (ref 135–146)
Total Bilirubin: 1 mg/dL (ref 0.2–1.2)
Total Protein: 7.4 g/dL (ref 6.1–8.1)

## 2020-03-24 LAB — HIV-1 RNA QUANT-NO REFLEX-BLD
HIV 1 RNA Quant: <20 copies/mL
HIV-1 RNA Quant, Log: <1.3 Log copies/mL

## 2020-03-24 LAB — RPR: RPR Ser Ql: REACTIVE — AB

## 2020-03-24 LAB — FLUORESCENT TREPONEMAL AB(FTA)-IGG-BLD: Fluorescent Treponemal ABS: REACTIVE — AB

## 2020-03-24 LAB — RPR TITER: RPR Titer: 1:1 {titer} — ABNORMAL HIGH

## 2020-06-11 ENCOUNTER — Other Ambulatory Visit: Payer: Self-pay | Admitting: Internal Medicine

## 2020-06-11 DIAGNOSIS — B20 Human immunodeficiency virus [HIV] disease: Secondary | ICD-10-CM

## 2020-07-23 ENCOUNTER — Ambulatory Visit: Payer: BC Managed Care – PPO | Admitting: Internal Medicine

## 2020-09-05 ENCOUNTER — Ambulatory Visit (INDEPENDENT_AMBULATORY_CARE_PROVIDER_SITE_OTHER): Payer: BC Managed Care – PPO | Admitting: Internal Medicine

## 2020-09-05 ENCOUNTER — Encounter: Payer: Self-pay | Admitting: Internal Medicine

## 2020-09-05 ENCOUNTER — Other Ambulatory Visit: Payer: Self-pay

## 2020-09-05 VITALS — BP 132/90 | HR 75 | Temp 98.0°F | Ht 68.0 in | Wt 192.0 lb

## 2020-09-05 DIAGNOSIS — M722 Plantar fascial fibromatosis: Secondary | ICD-10-CM | POA: Diagnosis not present

## 2020-09-05 DIAGNOSIS — Z23 Encounter for immunization: Secondary | ICD-10-CM | POA: Diagnosis not present

## 2020-09-05 DIAGNOSIS — Z79899 Other long term (current) drug therapy: Secondary | ICD-10-CM | POA: Diagnosis not present

## 2020-09-05 DIAGNOSIS — Z0000 Encounter for general adult medical examination without abnormal findings: Secondary | ICD-10-CM | POA: Diagnosis not present

## 2020-09-05 DIAGNOSIS — B20 Human immunodeficiency virus [HIV] disease: Secondary | ICD-10-CM

## 2020-09-05 NOTE — Progress Notes (Signed)
RFV: follow up for hiv disease  [name], male   DOB: 1965/12/16, 54 y.o.   MRN: [id number]  HPI 54yo M with well controlled HIV disease, CD 4 count 568/VL<20 in may 2021. On biktarvy.  Recovered from covid. Has had pfizer #1, [id number] lot number, on 03/08/20; pfizer #2, [id number] lot number on 03/29/20  Has been taking calcium supplement - to help bilaterally feet pain.  Outpatient Encounter Medications as of 09/05/2020  Medication Sig  . amitriptyline (ELAVIL) 25 MG tablet Take 1 tablet (25 mg total) by mouth at bedtime. Start with 1/2 tab at bedtime x 8 days, then increase to 1 tab at night  . amLODipine (NORVASC) 10 MG tablet Take 1 tablet (10 mg total) by mouth daily.  Marland Kitchen BIKTARVY 50-200-25 MG TABS tablet TAKE 1 TABLET BY MOUTH DAILY.  Marland Kitchen lisinopril (ZESTRIL) 20 MG tablet Take 1 tablet (20 mg total) by mouth daily.  . valACYclovir (VALTREX) 1000 MG tablet TAKE 1 TABLET(1000 MG) BY MOUTH DAILY  . clindamycin (CLEOCIN) 300 MG capsule Take 300 mg by mouth 4 (four) times daily. (Patient not taking: Reported on 09/05/2020)  . fluticasone (FLONASE) 50 MCG/ACT nasal spray Place 2 sprays into both nostrils daily. (Patient not taking: Reported on 09/05/2020)   No facility-administered encounter medications on file as of 09/05/2020.     Patient Active Problem List   Diagnosis Date Noted  . Herpes genitalis   . Bronchitis 10/19/2012  . Atypical chest pain 10/18/2012  . Family history of premature CAD 10/18/2012  . Chest pain 10/17/2012  . HSV (herpes simplex virus) anogenital infection 06/07/2012  . HIV (human immunodeficiency virus infection) ([place]) 01/13/2012  . HTN (hypertension) 12/31/2011     Health Maintenance Due  Topic Date Due  . COVID-19 Vaccine (1) Never done  . COLONOSCOPY  Never done  . INFLUENZA VACCINE  06/10/2020     Review of Systems 12 point ros is negative except what is mentioned above Physical Exam   BP 132/90   Pulse 75   Temp 98 F (36.7  C) (Oral)   Ht 5\' 8"  (1.727 m)   Wt 192 lb (87.1 kg)   SpO2 97%   BMI 29.19 kg/m   Physical Exam  Constitutional: He is oriented to person, place, and time. He appears well-developed and well-nourished. No distress.  HENT:  Mouth/Throat: Oropharynx is clear and moist. No oropharyngeal exudate.  Cardiovascular: Normal rate, regular rhythm and normal heart sounds. Exam reveals no gallop and no friction rub.  No murmur heard.  Pulmonary/Chest: Effort normal and breath sounds normal. No respiratory distress. He has no wheezes.  Abdominal: Soft. Bowel sounds are normal. He exhibits no distension. There is no tenderness.  Lymphadenopathy:  He has no cervical adenopathy.  Neurological: He is alert and oriented to person, place, and time.  Skin: Skin is warm and dry. No rash noted. No erythema.  Psychiatric: He has a normal mood and affect. His behavior is normal.    Lab Results  Component Value Date   CD4TCELL 30 (L) 03/22/2020   Lab Results  Component Value Date   CD4TABS 569 03/22/2020   CD4TABS 589 09/22/2019   CD4TABS 347 (L) 04/20/2019   Lab Results  Component Value Date   HIV1RNAQUANT <20 NOT DETECTED 03/22/2020   Lab Results  Component Value Date   HEPBSAB POS (A) 01/17/2014   Lab Results  Component Value Date   LABRPR REACTIVE (A) 03/22/2020    CBC Lab Results  Component Value Date   WBC 6.8 03/22/2020   RBC 4.44 03/22/2020   HGB 15.3 03/22/2020   HCT 43.8 03/22/2020   PLT 265 03/22/2020   MCV 98.6 03/22/2020   MCH 34.5 (H) 03/22/2020   MCHC 34.9 03/22/2020   RDW 11.8 03/22/2020   LYMPHSABS 1,775 03/22/2020   MONOABS 0.5 09/05/2019   EOSABS 333 03/22/2020    BMET Lab Results  Component Value Date   NA 138 03/22/2020   K 3.5 03/22/2020   CL 102 03/22/2020   CO2 30 03/22/2020   GLUCOSE 90 03/22/2020   BUN 10 03/22/2020   CREATININE 1.04 03/22/2020   CALCIUM 9.4 03/22/2020   GFRNONAA 80 03/22/2020   GFRAA 93 03/22/2020      Assessment and  Plan  hiv disease = will check labs today  Health maintenance = will give flu shot today and set up for booster  Long term medication management = will check cr   Plantar fasciitis = gave recs on how to treat

## 2020-09-06 LAB — T-HELPER CELL (CD4) - (RCID CLINIC ONLY)
CD4 % Helper T Cell: 30 % — ABNORMAL LOW (ref 33–65)
CD4 T Cell Abs: 445 /uL (ref 400–1790)

## 2020-09-10 LAB — LIPID PANEL
Cholesterol: 151 mg/dL (ref <200)
HDL: 42 mg/dL (ref >=40)
LDL Cholesterol (Calc): 95 mg/dL (calc)
Non-HDL Cholesterol (Calc): 109 mg/dL (calc) (ref <130)
Total CHOL/HDL Ratio: 3.6 (calc) (ref <5.0)
Triglycerides: 57 mg/dL (ref <150)

## 2020-09-10 LAB — COMPLETE METABOLIC PANEL WITH GFR
AG Ratio: 1.6 (calc) (ref 1.0–2.5)
ALT: 24 U/L (ref 9–46)
AST: 20 U/L (ref 10–35)
Albumin: 4.5 g/dL (ref 3.6–5.1)
Alkaline phosphatase (APISO): 45 U/L (ref 35–144)
BUN: 9 mg/dL (ref 7–25)
CO2: 31 mmol/L (ref 20–32)
Calcium: 9.7 mg/dL (ref 8.6–10.3)
Chloride: 100 mmol/L (ref 98–110)
Creat: 0.98 mg/dL (ref 0.70–1.33)
GFR, Est African American: 100 mL/min/{1.73_m2} (ref >=60)
GFR, Est Non African American: 86 mL/min/{1.73_m2} (ref >=60)
Globulin: 2.9 g/dL (calc) (ref 1.9–3.7)
Glucose, Bld: 86 mg/dL (ref 65–99)
Potassium: 3.2 mmol/L — ABNORMAL LOW (ref 3.5–5.3)
Sodium: 141 mmol/L (ref 135–146)
Total Bilirubin: 1.2 mg/dL (ref 0.2–1.2)
Total Protein: 7.4 g/dL (ref 6.1–8.1)

## 2020-09-10 LAB — CBC WITH DIFFERENTIAL/PLATELET
Absolute Monocytes: 362 cells/uL (ref 200–950)
Basophils Absolute: 61 cells/uL (ref 0–200)
Basophils Relative: 1.2 %
Eosinophils Absolute: 209 cells/uL (ref 15–500)
Eosinophils Relative: 4.1 %
HCT: 45.4 % (ref 38.5–50.0)
Hemoglobin: 15.8 g/dL (ref 13.2–17.1)
Lymphs Abs: 1510 cells/uL (ref 850–3900)
MCH: 34.4 pg — ABNORMAL HIGH (ref 27.0–33.0)
MCHC: 34.8 g/dL (ref 32.0–36.0)
MCV: 98.9 fL (ref 80.0–100.0)
MPV: 11 fL (ref 7.5–12.5)
Monocytes Relative: 7.1 %
Neutro Abs: 2958 cells/uL (ref 1500–7800)
Neutrophils Relative %: 58 %
Platelets: 243 10*3/uL (ref 140–400)
RBC: 4.59 10*6/uL (ref 4.20–5.80)
RDW: 11.8 % (ref 11.0–15.0)
Total Lymphocyte: 29.6 %
WBC: 5.1 10*3/uL (ref 3.8–10.8)

## 2020-09-10 LAB — HIV-1 RNA QUANT-NO REFLEX-BLD
HIV 1 RNA Quant: <20 Copies/mL — ABNORMAL HIGH
HIV-1 RNA Quant, Log: <1.3 Log cps/mL — ABNORMAL HIGH

## 2020-09-10 LAB — RPR: RPR Ser Ql: NONREACTIVE

## 2020-09-28 ENCOUNTER — Other Ambulatory Visit: Payer: Self-pay

## 2020-09-28 ENCOUNTER — Telehealth: Payer: Self-pay | Admitting: *Deleted

## 2020-09-28 ENCOUNTER — Ambulatory Visit (INDEPENDENT_AMBULATORY_CARE_PROVIDER_SITE_OTHER): Payer: BC Managed Care – PPO

## 2020-09-28 ENCOUNTER — Ambulatory Visit
Admission: RE | Admit: 2020-09-28 | Discharge: 2020-09-28 | Disposition: A | Discharge status: Home / Self Care | Payer: BC Managed Care – PPO | Source: Ambulatory Visit | Attending: Internal Medicine | Admitting: Internal Medicine

## 2020-09-28 DIAGNOSIS — M722 Plantar fascial fibromatosis: Secondary | ICD-10-CM

## 2020-09-28 DIAGNOSIS — Z23 Encounter for immunization: Secondary | ICD-10-CM

## 2020-09-28 NOTE — Progress Notes (Signed)
   Covid-19 Vaccination Clinic  Name:  Colin Alexander    MRN: [id number] DOB: [birth year]  09/28/2020  [name] was observed post Covid-19 immunization for 30 minutes based on pre-vaccination screening without incident. He was provided with Vaccine Information Sheet and instruction to access the V-Safe system.   [name] was instructed to call 911 with any severe reactions post vaccine: Marland Kitchen Difficulty breathing  . Swelling of face and throat  . A fast heartbeat  . A bad rash all over body  . Dizziness and weakness   Immunizations Administered    Name Date Dose VIS Date Route   Pfizer COVID-19 Vaccine 09/28/2020 10:00 AM 0.3 mL 08/29/2020 Intramuscular   Manufacturer: [place]   Lot: [id number]   [place]: [id number]     Landis Gandy, RN

## 2020-09-28 NOTE — Telephone Encounter (Signed)
Left foot xray order per Dr Baxter Flattery. Landis Gandy, RN

## 2020-10-19 ENCOUNTER — Other Ambulatory Visit: Payer: Self-pay | Admitting: Internal Medicine

## 2020-11-06 ENCOUNTER — Telehealth: Payer: Self-pay

## 2020-11-06 NOTE — Telephone Encounter (Signed)
Patient called requesting foot x ray results.  Please advise. Colin Alexander

## 2020-11-09 NOTE — Telephone Encounter (Signed)
Gave results for his foot xray, normal. His foot pain improved.

## 2021-01-22 ENCOUNTER — Other Ambulatory Visit: Payer: Self-pay | Admitting: Internal Medicine

## 2021-01-22 DIAGNOSIS — B009 Herpesviral infection, unspecified: Secondary | ICD-10-CM

## 2021-02-14 ENCOUNTER — Other Ambulatory Visit: Payer: Self-pay

## 2021-02-14 DIAGNOSIS — B20 Human immunodeficiency virus [HIV] disease: Secondary | ICD-10-CM

## 2021-02-14 DIAGNOSIS — Z113 Encounter for screening for infections with a predominantly sexual mode of transmission: Secondary | ICD-10-CM

## 2021-02-14 DIAGNOSIS — Z79899 Other long term (current) drug therapy: Secondary | ICD-10-CM

## 2021-02-20 ENCOUNTER — Other Ambulatory Visit: Payer: BC Managed Care – PPO

## 2021-03-07 ENCOUNTER — Ambulatory Visit: Payer: BC Managed Care – PPO | Admitting: Internal Medicine

## 2021-03-23 ENCOUNTER — Other Ambulatory Visit: Payer: Self-pay | Admitting: Internal Medicine

## 2021-05-23 ENCOUNTER — Other Ambulatory Visit: Payer: Self-pay | Admitting: Internal Medicine

## 2021-05-24 ENCOUNTER — Other Ambulatory Visit: Payer: Self-pay | Admitting: Internal Medicine

## 2021-05-24 DIAGNOSIS — B20 Human immunodeficiency virus [HIV] disease: Secondary | ICD-10-CM

## 2021-05-24 NOTE — Telephone Encounter (Signed)
Rcvd refill request and tried to call and tell patient he will have to schedule appt before sending in refills as it has been over 1 year since seen at [facility]. Patient hung up before I could schedule this. He will need a follow up and refills to last until this visit if he calls back.

## 2021-06-08 ENCOUNTER — Other Ambulatory Visit: Payer: Self-pay | Admitting: Internal Medicine

## 2021-06-10 NOTE — Telephone Encounter (Signed)
Please advise on refill.

## 2021-06-13 NOTE — Telephone Encounter (Signed)
Called patient to have PCP take over Amlodipine refills. Patient states that her provider is no longer practicing in [place]. Is currently looking for a new pcp. Patient not able to stay on line long and disconnected call before CMA could offer number to IM. Leatrice Jewels, RMA

## 2021-06-24 ENCOUNTER — Other Ambulatory Visit: Payer: Self-pay

## 2021-06-24 DIAGNOSIS — I10 Essential (primary) hypertension: Secondary | ICD-10-CM

## 2021-06-24 MED ORDER — AMLODIPINE BESYLATE 10 MG PO TABS: ORAL_TABLET | ORAL | Status: DC

## 2021-07-11 ENCOUNTER — Other Ambulatory Visit: Payer: Self-pay

## 2021-07-11 ENCOUNTER — Telehealth (INDEPENDENT_AMBULATORY_CARE_PROVIDER_SITE_OTHER): Payer: Self-pay | Admitting: Internal Medicine

## 2021-07-11 DIAGNOSIS — R0981 Nasal congestion: Secondary | ICD-10-CM

## 2021-07-11 DIAGNOSIS — B20 Human immunodeficiency virus [HIV] disease: Secondary | ICD-10-CM

## 2021-07-11 NOTE — Progress Notes (Signed)
Virtual Visit via Telephone Note  I connected with Colin Alexander on 07/11/21 at  3:45 PM EDT by telephone and verified that I am speaking with the correct person using two identifiers.  Location: Patient: at work Provider: at clinic   I discussed the limitations, risks, security and privacy concerns of performing an evaluation and management service by telephone and the availability of in person appointments. I also discussed with the patient that there may be a patient responsible charge related to this service. The patient expressed understanding and agreed to proceed.   History of Present Illness: His father had a stroke this weekend and took him quickly to [facility], but he suspect had mini-stroke earlier in the week.   He lost his PCP, closed his practice.   Has had sinus-nasal congestion with headache over the past week, he thinks it is due AC    Observations/Objective: Doing well  Assessment and Plan: Hiv disease = well controlled, and plan to continue medication  Nasal congestion = recommend to do covid testing   - monkeypox candidate - will call to schedule. Follow Up Instructions:  Give info for new pcp I discussed the assessment and treatment plan with the patient. The patient was provided an opportunity to ask questions and all were answered. The patient agreed with the plan and demonstrated an understanding of the instructions.   The patient was advised to call back or seek an in-person evaluation if the symptoms worsen or if the condition fails to improve as anticipated.  I provided 10 minutes of non-face-to-face time during this encounter.   Carlyle Basques, MD

## 2021-07-22 ENCOUNTER — Other Ambulatory Visit: Payer: Self-pay | Admitting: Internal Medicine

## 2021-07-22 DIAGNOSIS — B20 Human immunodeficiency virus [HIV] disease: Secondary | ICD-10-CM

## 2021-08-12 ENCOUNTER — Telehealth: Payer: Self-pay

## 2021-08-12 NOTE — Telephone Encounter (Signed)
Patient called, states he has been having headaches and congestion for the last 2 weeks. He says he took a COVID test 2 weeks ago and it came back negative. Denies cough and fevers. Advised him Dr. Baxter Flattery is not in the office this week. He wants to be set up with primary care, provided him with phone numbers to internal medicine and community health and wellness. Advised patient he will probably not be able to be seen quickly as it would be a new patient appointment.   He says he works for [employer] and can be seen at the student health center relatively quickly. Agreed with patient that would be the best course of action and to repeat COVID testing. He says he's been taking Mucinex for almost 2 weeks, advised he needs to be seen by a provider since that has not provided any relief and he believes the congestions is interfering with his hearing in the right ear. He says he will call to set up an appointment with [employer] [facility].   Beryle Flock, RN

## 2021-08-14 ENCOUNTER — Other Ambulatory Visit: Payer: Self-pay

## 2021-08-14 ENCOUNTER — Ambulatory Visit
Admission: EM | Admit: 2021-08-14 | Discharge: 2021-08-14 | Disposition: A | Discharge status: Home / Self Care | Payer: BC Managed Care – PPO | Attending: Urgent Care | Admitting: Urgent Care

## 2021-08-14 DIAGNOSIS — J018 Other acute sinusitis: Secondary | ICD-10-CM

## 2021-08-14 DIAGNOSIS — R051 Acute cough: Secondary | ICD-10-CM

## 2021-08-14 DIAGNOSIS — B20 Human immunodeficiency virus [HIV] disease: Secondary | ICD-10-CM

## 2021-08-14 MED ORDER — DOXYCYCLINE HYCLATE 100 MG PO CAPS: 100.0000 mg | ORAL_CAPSULE | Freq: Two times a day (BID) | ORAL | 0 refills | Status: DC

## 2021-08-14 MED ORDER — CETIRIZINE HCL 10 MG PO TABS: 10.0000 mg | ORAL_TABLET | Freq: Every day | ORAL | 0 refills | Status: DC

## 2021-08-14 MED ORDER — PROMETHAZINE-DM 6.25-15 MG/5ML PO SYRP: 5.0000 mL | ORAL_SOLUTION | Freq: Every evening | ORAL | 0 refills | Status: DC | PRN

## 2021-08-14 NOTE — ED Provider Notes (Signed)
[place]   MRN: [id number] DOB: [birth year]  Subjective:   Colin Alexander is a 55 y.o. male presenting for 2 week history of persistent sinus congestion, runny nose, productive cough. Has been using otc medications without any relief.  Denies chest pain, shortness of breath, history of asthma.  No current facility-administered medications for this encounter.  Current Outpatient Medications:    amitriptyline (ELAVIL) 25 MG tablet, Take 1 tablet (25 mg total) by mouth at bedtime. Start with 1/2 tab at bedtime x 8 days, then increase to 1 tab at night, Disp: 30 tablet, Rfl: 11   amLODipine (NORVASC) 10 MG tablet, TAKE 1 TABLET(10 MG) BY MOUTH DAILY, Disp: 30 tablet, Rfl: 01   BIKTARVY 50-200-25 MG TABS tablet, TAKE 1 TABLET BY MOUTH DAILY., Disp: 30 tablet, Rfl: 5   clindamycin (CLEOCIN) 300 MG capsule, Take 300 mg by mouth 4 (four) times daily., Disp: , Rfl:    fluticasone (FLONASE) 50 MCG/ACT nasal spray, Place 2 sprays into both nostrils daily. (Patient not taking: No sig reported), Disp: , Rfl:    lisinopril (ZESTRIL) 20 MG tablet, TAKE 1 TABLET(20 MG) BY MOUTH DAILY, Disp: 30 tablet, Rfl: 3   valACYclovir (VALTREX) 1000 MG tablet, TAKE 1 TABLET(1000 MG) BY MOUTH DAILY, Disp: 30 tablet, Rfl: 5   Allergies  Allergen Reactions   Penicillins Shortness Of Breath and Rash   Raspberry Swelling    Past Medical History:  Diagnosis Date   HIV (human immunodeficiency virus infection) ([place])    Hypertension      Past Surgical History:  Procedure Laterality Date   CIRCUMCISION     HERNIA REPAIR      Family History  Problem Relation Age of Onset   Heart attack Mother        Stent placement   Hypertension Mother    Heart attack Father        CABG   Hypertension Father    Heart attack Cousin        In his 12's deceased.    Social History   Tobacco Use   Smoking status: Never   Smokeless tobacco: Never  Substance Use Topics   Alcohol use: No    Alcohol/week:  0.0 standard drinks   Drug use: No    ROS   Objective:   Vitals: BP (!) 151/103 (BP Location: Left Arm)   Pulse 97   Temp 98.4 F (36.9 C) (Oral)   Resp 18   SpO2 96%   Physical Exam Constitutional:      General: He is not in acute distress.    Appearance: Normal appearance. He is well-developed. He is not ill-appearing, toxic-appearing or diaphoretic.  HENT:     Head: Normocephalic and atraumatic.     Right Ear: External ear normal.     Left Ear: External ear normal.     Nose: Congestion and rhinorrhea present.     Mouth/Throat:     Mouth: Mucous membranes are moist.     Pharynx: No oropharyngeal exudate or posterior oropharyngeal erythema.     Comments: Significant post-nasal drainage overlying pharynx.  Eyes:     General: No scleral icterus.       Right eye: No discharge.        Left eye: No discharge.     Extraocular Movements: Extraocular movements intact.     Conjunctiva/sclera: Conjunctivae normal.     Pupils: Pupils are equal, round, and reactive to light.  Cardiovascular:     Rate  and Rhythm: Normal rate and regular rhythm.     Heart sounds: Normal heart sounds. No murmur heard.   No friction rub. No gallop.  Pulmonary:     Effort: Pulmonary effort is normal. No respiratory distress.     Breath sounds: Normal breath sounds. No stridor. No wheezing, rhonchi or rales.  Neurological:     Mental Status: He is alert and oriented to person, place, and time.  Psychiatric:        Mood and Affect: Mood normal.        Behavior: Behavior normal.        Thought Content: Thought content normal.        Judgment: Judgment normal.    Assessment and Plan :   PDMP not reviewed this encounter.  1. Acute non-recurrent sinusitis of other sinus   2. Acute cough   3. HIV disease ([place])     Will start empiric treatment for sinusitis with doxycycline.  Recommended supportive care otherwise including the use of oral antihistamine.  Given timeline of his illness will defer  COVID testing.  Counseled patient on potential for adverse effects with medications prescribed/recommended today, ER and return-to-clinic precautions discussed, patient verbalized understanding.   Jaynee Eagles, [place] 08/14/21 1942

## 2021-08-14 NOTE — ED Triage Notes (Signed)
Pt c/o nasal congestion, cough, urinary frequency, lightheadedness, and right ear congestion. Associated green mucous discharge. Denies sore throat, cough, nausea, vomiting, diarrhea, constipation. Onset over 2 weeks ago. Tried mucinex and coriciden at home without relief.

## 2021-09-04 ENCOUNTER — Other Ambulatory Visit: Payer: Self-pay | Admitting: Internal Medicine

## 2021-09-04 DIAGNOSIS — I10 Essential (primary) hypertension: Secondary | ICD-10-CM

## 2021-12-13 ENCOUNTER — Encounter: Payer: Self-pay | Admitting: Student

## 2021-12-13 ENCOUNTER — Ambulatory Visit: Payer: BC Managed Care – PPO | Admitting: Student

## 2021-12-13 VITALS — BP 168/121 | HR 76 | Wt 192.3 lb

## 2021-12-13 DIAGNOSIS — Z1211 Encounter for screening for malignant neoplasm of colon: Secondary | ICD-10-CM | POA: Diagnosis not present

## 2021-12-13 DIAGNOSIS — Z131 Encounter for screening for diabetes mellitus: Secondary | ICD-10-CM

## 2021-12-13 DIAGNOSIS — Z0000 Encounter for general adult medical examination without abnormal findings: Secondary | ICD-10-CM

## 2021-12-13 DIAGNOSIS — B20 Human immunodeficiency virus [HIV] disease: Secondary | ICD-10-CM

## 2021-12-13 DIAGNOSIS — A609 Anogenital herpesviral infection, unspecified: Secondary | ICD-10-CM

## 2021-12-13 DIAGNOSIS — Z23 Encounter for immunization: Secondary | ICD-10-CM

## 2021-12-13 DIAGNOSIS — I10 Essential (primary) hypertension: Secondary | ICD-10-CM

## 2021-12-13 DIAGNOSIS — Z21 Asymptomatic human immunodeficiency virus [HIV] infection status: Secondary | ICD-10-CM

## 2021-12-13 MED ORDER — AMLODIPINE BESYLATE 10 MG PO TABS: ORAL_TABLET | ORAL | 3 refills | Status: DC

## 2021-12-13 MED ORDER — LISINOPRIL 20 MG PO TABS: ORAL_TABLET | ORAL | 2 refills | Status: DC

## 2021-12-13 NOTE — Assessment & Plan Note (Signed)
History of HIV, well controlled on Biktarvy for the past 2 years. Follows Dr. Baxter Flattery at [facility]. Last CD4 569 (May 2021) and viral load has been undetectable for the past 2 years. Plans to see Dr. Baxter Flattery again soon.  Plan: -f/u with [facility] -continue Biktarvy

## 2021-12-13 NOTE — Progress Notes (Signed)
Internal Medicine Clinic Attending  Case discussed with Dr. Jinwala  At the time of the visit.  We reviewed the resident's history and exam and pertinent patient test results.  I agree with the assessment, diagnosis, and plan of care documented in the resident's note.  

## 2021-12-13 NOTE — Assessment & Plan Note (Signed)
Follows Dr. Baxter Flattery at [facility]. He is currently taking his valacyclovir as advised. He has not seen Dr. Baxter Flattery this year yet, but plans to schedule follow up soon.  Plan: -f/u with [facility] -continue valacyclovir

## 2021-12-13 NOTE — Assessment & Plan Note (Addendum)
Getting flu shot today.  Placed referral for screening colonoscopy.  Advised to obtain COVID booster shot. States that he will get it done. F/u at next visit.

## 2021-12-13 NOTE — Assessment & Plan Note (Addendum)
Patient with history of HTN, previously well-controlled with norvasc 10mg  and lisinopril 20mg  daily. He has ran out of his medications for the past two months. Notes that he was taking care of his father (recently deceased) and in doing so neglected to care for himself. He is motivated to get back on track. BP elevated today even on repeat. Plan to restart his previous antihypertensives at same dose. Will also obtain BMP today to evaluate kidney function prior to restarting lisinopril. He does have a BP cuff at home and is willing to keep a blood pressure log until next visit. Also provided DASH diet to help better control HTN.  Aside from history of HTN and family history, no other significant risk factors for severe cardiovascular disease. Peripheral pulses intact and symmetrical. Will obtain A1c to check for T2DM.  Plan: -DASH diet -restart norvasc 10mg  daily and lisinopril 20mg  daily -f/u BMP, A1c -keep BP log at home -f/u in 2 weeks

## 2021-12-13 NOTE — Patient Instructions (Signed)
Colin Alexander,  It was a pleasure seeing you in the clinic today.   I have refilled your blood pressure medications (amlodipine and lisinopril). Please pick them up from your pharmacy. Please try to keep a blood pressure log at home (take twice daily if possible). I have placed a referral to the stomach doctors for your colonoscopy. We are getting some labs today, I will call you with the results. Please come back in 2 weeks for follow up.  Please call our clinic at [phone] if you have any questions or concerns. The best time to call is Monday-Friday from 9am-4pm, but there is someone available 24/7 at the same number. If you need medication refills, please notify your pharmacy one week in advance and they will send [place] a request.   Thank you for letting us take part in your care. We look forward to seeing you next time!

## 2021-12-13 NOTE — Progress Notes (Signed)
CC: HTN, new to establish PCP  HPI:  Colin Alexander is a 56 y.o. male with history listed below presenting to the [facility] for HTN, new to establish PCP. Please see individualized problem based charting for full HPI.  Past Medical History:  Diagnosis Date   Atypical chest pain 10/18/2012   Bronchitis 10/19/2012   Chest pain 10/17/2012   HIV (human immunodeficiency virus infection) ([place])    Hypertension    Past Surgical History:  Procedure Laterality Date   CIRCUMCISION     HERNIA REPAIR     Current Outpatient Medications on File Prior to Visit  Medication Sig Dispense Refill   BIKTARVY 50-200-25 MG TABS tablet TAKE 1 TABLET BY MOUTH DAILY. 30 tablet 5   valACYclovir (VALTREX) 1000 MG tablet TAKE 1 TABLET(1000 MG) BY MOUTH DAILY 30 tablet 5   No current facility-administered medications on file prior to visit.   Allergies  Allergen Reactions   Penicillins Shortness Of Breath and Rash   Raspberry Swelling   Family History  Problem Relation Age of Onset   Heart attack Mother        Stent placement   Hypertension Mother    Heart attack Father        CABG   Hypertension Father    Heart attack Cousin        In his 64's deceased.   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 -Never used tobacco or illicit drugs -occasional alcohol use  Review of Systems:  Negative aside from that listed in individualized problem based charting.  Physical Exam:  Vitals:   12/13/21 0927 12/13/21 1005  BP: (!) 166/109 (!) 168/121  Pulse: 75 76  SpO2: 100%   Weight: 192 lb 4.8 oz (87.2 kg)    Physical Exam Constitutional:      Appearance: Normal appearance. Colin Alexander is not ill-appearing.  HENT:     Head: Normocephalic and atraumatic.     Mouth/Throat:     Mouth: Mucous membranes are moist.     Pharynx: Oropharynx is clear. No oropharyngeal exudate.  Eyes:     Extraocular Movements: Extraocular movements  intact.     Conjunctiva/sclera: Conjunctivae normal.     Pupils: Pupils are equal, round, and reactive to light.  Cardiovascular:     Rate and Rhythm: Normal rate and regular rhythm.     Pulses: Normal pulses.     Heart sounds: Normal heart sounds. No murmur heard.   No gallop.     Comments: Radial pulses and DP pulses 2+ and symmetric. Pulmonary:     Effort: Pulmonary effort is normal.     Breath sounds: Normal breath sounds. No wheezing, rhonchi or rales.  Abdominal:     General: Bowel sounds are normal. There is no distension.     Palpations: Abdomen is soft.     Tenderness: There is no abdominal tenderness.  Musculoskeletal:        General: No swelling. Normal range of motion.     Cervical back: Normal range of motion.  Skin:    General: Skin is warm and dry.  Neurological:     General: No focal deficit present.     Mental Status: Colin Alexander is alert and oriented to person, place, and time.     Cranial Nerves: No cranial nerve deficit.     Motor: No weakness.  Psychiatric:        Mood and Affect: Mood normal.  Behavior: Behavior normal.     Assessment & Plan:   See Encounters Tab for problem based charting.  Patient discussed with Dr.  Jimmye Norman

## 2021-12-14 LAB — BMP8+ANION GAP
Anion Gap: 17 mmol/L (ref 10.0–18.0)
BUN/Creatinine Ratio: 11 (ref 9–20)
BUN: 8 mg/dL (ref 6–24)
CO2: 23 mmol/L (ref 20–29)
Calcium: 9.2 mg/dL (ref 8.7–10.2)
Chloride: 101 mmol/L (ref 96–106)
Creatinine, Ser: 0.74 mg/dL — ABNORMAL LOW (ref 0.76–1.27)
Glucose: 78 mg/dL (ref 70–99)
Potassium: 3.4 mmol/L — ABNORMAL LOW (ref 3.5–5.2)
Sodium: 141 mmol/L (ref 134–144)
eGFR: 106 mL/min/{1.73_m2} (ref >59)

## 2021-12-14 LAB — HEMOGLOBIN A1C
Est. average glucose Bld gHb Est-mCnc: 91 mg/dL
Hgb A1c MFr Bld: 4.8 % (ref 4.8–5.6)

## 2021-12-26 ENCOUNTER — Encounter: Payer: BC Managed Care – PPO | Admitting: Student

## 2021-12-27 ENCOUNTER — Encounter: Payer: BC Managed Care – PPO | Admitting: Student

## 2022-01-02 ENCOUNTER — Encounter: Payer: BC Managed Care – PPO | Admitting: Student

## 2022-01-07 ENCOUNTER — Other Ambulatory Visit: Payer: Self-pay | Admitting: Internal Medicine

## 2022-01-07 DIAGNOSIS — B009 Herpesviral infection, unspecified: Secondary | ICD-10-CM

## 2022-01-07 NOTE — Telephone Encounter (Signed)
Okay to refill? 

## 2022-01-13 ENCOUNTER — Telehealth: Payer: Self-pay

## 2022-01-13 DIAGNOSIS — B20 Human immunodeficiency virus [HIV] disease: Secondary | ICD-10-CM

## 2022-01-13 MED ORDER — BIKTARVY 50-200-25 MG PO TABS: 1.0000 | ORAL_TABLET | Freq: Every day | ORAL | 0 refills | Status: DC

## 2022-01-13 NOTE — Telephone Encounter (Signed)
Patient left voicemail to request refills, has not had labs done since 08/2020. Called patient back to schedule follow up appointment, no answer and unable to leave message. Will send in 30 days.  ? ?Beryle Flock, RN ? ?

## 2022-01-13 NOTE — Telephone Encounter (Signed)
Awaiting provider response.  ?

## 2022-03-10 ENCOUNTER — Ambulatory Visit: Payer: BC Managed Care – PPO | Admitting: Nurse Practitioner

## 2022-04-10 ENCOUNTER — Encounter: Payer: BC Managed Care – PPO | Admitting: Student

## 2022-04-22 ENCOUNTER — Other Ambulatory Visit: Payer: Self-pay

## 2022-04-22 ENCOUNTER — Emergency Department (HOSPITAL_COMMUNITY)
Admission: EM | Admit: 2022-04-22 | Discharge: 2022-04-22 | Disposition: A | Discharge status: Home / Self Care | Payer: BC Managed Care – PPO | Attending: Emergency Medicine | Admitting: Emergency Medicine

## 2022-04-22 ENCOUNTER — Encounter (HOSPITAL_COMMUNITY): Payer: Self-pay

## 2022-04-22 DIAGNOSIS — S81812A Laceration without foreign body, left lower leg, initial encounter: Secondary | ICD-10-CM | POA: Insufficient documentation

## 2022-04-22 DIAGNOSIS — W208XXA Other cause of strike by thrown, projected or falling object, initial encounter: Secondary | ICD-10-CM | POA: Diagnosis not present

## 2022-04-22 DIAGNOSIS — Z23 Encounter for immunization: Secondary | ICD-10-CM | POA: Diagnosis not present

## 2022-04-22 DIAGNOSIS — S8992XA Unspecified injury of left lower leg, initial encounter: Secondary | ICD-10-CM | POA: Diagnosis present

## 2022-04-22 MED ORDER — TETANUS-DIPHTH-ACELL PERTUSSIS 5-2.5-18.5 LF-MCG/0.5 IM SUSY
0.5000 mL | PREFILLED_SYRINGE | Freq: Once | INTRAMUSCULAR | Status: AC
  Administered 2022-04-22: 0.5 mL via INTRAMUSCULAR
  Filled 2022-04-22: qty 0.5

## 2022-04-22 NOTE — ED Provider Notes (Signed)
[facility] EMERGENCY DEPARTMENT Provider Note   CSN: [id number] Arrival date & time: 04/22/22  0631     History  Chief Complaint  Patient presents with   Laceration    Colin Alexander is a 56 y.o. male.  Patient presents with laceration to anterior left leg lower aspect after kick plate from the door fell on his leg.  Patient not on blood thinners.  No other injuries.  Does not think tetanus shot is up-to-date.       Home Medications Prior to Admission medications   Medication Sig Start Date End Date Taking? Authorizing Provider  valACYclovir (VALTREX) 1000 MG tablet TAKE 1 TABLET(1000 MG) BY MOUTH DAILY 01/13/22   Carlyle Basques, MD  amLODipine (NORVASC) 10 MG tablet TAKE 1 TABLET(10 MG) BY MOUTH DAILY 12/13/21   Virl Axe, MD  bictegravir-emtricitabine-tenofovir AF (BIKTARVY) 50-200-25 MG TABS tablet Take 1 tablet by mouth daily. 01/13/22   Carlyle Basques, MD  lisinopril (ZESTRIL) 20 MG tablet TAKE 1 TABLET(20 MG) BY MOUTH DAILY 12/13/21   Virl Axe, MD      Allergies    Penicillins and Raspberry    Review of Systems   Review of Systems  Constitutional:  Negative for chills and fever.  HENT:  Negative for congestion.   Eyes:  Negative for visual disturbance.  Respiratory:  Negative for shortness of breath.   Cardiovascular:  Negative for chest pain.  Gastrointestinal:  Negative for abdominal pain and vomiting.  Genitourinary:  Negative for dysuria and flank pain.  Musculoskeletal:  Negative for back pain, neck pain and neck stiffness.  Skin:  Positive for wound. Negative for rash.  Neurological:  Negative for light-headedness and headaches.    Physical Exam Updated Vital Signs BP (!) 141/100 (BP Location: Left Arm)   Pulse 72   Temp 98.6 F (37 C) (Oral)   Resp 19   Ht '5\' 8"'$  (1.727 m)   Wt 88.5 kg   SpO2 98%   BMI 29.65 kg/m  Physical Exam Vitals and nursing note reviewed.  Constitutional:      General: He is not in acute distress.     Appearance: He is well-developed.  HENT:     Head: Normocephalic.     Mouth/Throat:     Mouth: Mucous membranes are moist.  Eyes:     General:        Right eye: No discharge.        Left eye: No discharge.     Conjunctiva/sclera: Conjunctivae normal.  Neck:     Trachea: No tracheal deviation.  Cardiovascular:     Rate and Rhythm: Normal rate.  Pulmonary:     Effort: Pulmonary effort is normal.  Abdominal:     General: There is no distension.  Musculoskeletal:     Cervical back: Normal range of motion and neck supple.     Comments: Patient has 1 cm superficial laceration no active bleeding, dried blood surrounding anterior lower left leg.  No bony tenderness.  Neurovascular intact distal leg.  Skin:    General: Skin is warm.     Capillary Refill: Capillary refill takes less than 2 seconds.  Neurological:     General: No focal deficit present.     Mental Status: He is alert.  Psychiatric:        Mood and Affect: Mood normal.     ED Results / Procedures / Treatments   Labs (all labs ordered are listed, but only abnormal results are displayed) Labs Reviewed -  No data to display  EKG None  Radiology No results found.  Procedures .Marland KitchenLaceration Repair  Date/Time: 04/22/2022 7:48 AM  Performed by: Elnora Morrison, MD Authorized by: Elnora Morrison, MD   Consent:    Consent obtained:  Verbal   Consent given by:  Patient   Risks, benefits, and alternatives were discussed: yes     Risks discussed:  Pain and infection Universal protocol:    Procedure explained and questions answered to patient or proxy's satisfaction: yes   Anesthesia:    Anesthesia method:  None Laceration details:    Location:  Leg   Leg location:  L lower leg   Length (cm):  1   Depth (mm):  3 Exploration:    Limited defect created (wound extended): no     Hemostasis achieved with:  Direct pressure   Wound extent: no areolar tissue violation noted, no fascia violation noted, no foreign  bodies/material noted and no muscle damage noted     Contaminated: no   Treatment:    Area cleansed with:  Chlorhexidine   Amount of cleaning:  Standard   Visualized foreign bodies/material removed: no     Debridement:  None   Undermining:  None Skin repair:    Repair method:  Tissue adhesive Approximation:    Approximation:  Close Repair type:    Repair type:  Simple Post-procedure details:    Dressing:  Open (no dressing)   Procedure completion:  Tolerated well, no immediate complications     Medications Ordered in ED Medications  Tdap (BOOSTRIX) injection 0.5 mL (has no administration in time range)    ED Course/ Medical Decision Making/ A&P                           Medical Decision Making Risk Prescription drug management.   Patient presents with isolated left leg laceration amenable to Dermabond.  Wound care followed by Dermabond.  Discussed keeping wound dry and clean and reasons to return shared.  Tetanus shot updated.  Patient stable for discharge.        Final Clinical Impression(s) / ED Diagnoses Final diagnoses:  Laceration of left lower extremity, initial encounter    Rx / DC Orders ED Discharge Orders     None         Elnora Morrison, MD 04/22/22 [phone]

## 2022-04-22 NOTE — Discharge Instructions (Addendum)
Keep wound dry throughout today and then gentle soap and water starting tomorrow. Watch for signs of infection such as pus draining, spreading redness, fevers. For pain use Tylenol every 4 hours as needed. No hot tub or bath tub until wound healed or at least 5 days.

## 2022-04-22 NOTE — ED Triage Notes (Signed)
Patient arrives POV c/o laceration to anterior left leg (above ankle) after brass kick plate from door fell on his leg. Pt's laceration is covered with paper towel and is taped. Pt denies taking blood thinners. Pt a&o x4, no acute distress.

## 2022-05-08 ENCOUNTER — Other Ambulatory Visit: Payer: Self-pay | Admitting: Student

## 2022-05-08 ENCOUNTER — Ambulatory Visit (INDEPENDENT_AMBULATORY_CARE_PROVIDER_SITE_OTHER): Payer: BC Managed Care – PPO | Admitting: Student

## 2022-05-08 DIAGNOSIS — I10 Essential (primary) hypertension: Secondary | ICD-10-CM

## 2022-05-08 DIAGNOSIS — B20 Human immunodeficiency virus [HIV] disease: Secondary | ICD-10-CM | POA: Diagnosis not present

## 2022-05-08 MED ORDER — AMLODIPINE BESYLATE 10 MG PO TABS: ORAL_TABLET | ORAL | 3 refills | Status: DC

## 2022-05-08 MED ORDER — LISINOPRIL 20 MG PO TABS: ORAL_TABLET | ORAL | 3 refills | Status: DC

## 2022-05-08 NOTE — Assessment & Plan Note (Signed)
Today's Vitals   05/08/22 1512  BP: (!) 171/123  Pulse: 74  Temp: 98.1 F (36.7 C)  TempSrc: Oral  SpO2: 96%  Weight: 197 lb 3.2 oz (89.4 kg)  Height: '5\' 8"'$  (1.727 m)  PainSc: 0-No pain   Body mass index is 29.98 kg/m.  Patient living with HTN, currently taking norvasc '10mg'$  daily and lisinopril '20mg'$  daily. He reports that he ran out of his medications for over a month now. He was away caring for his terminally ill father, who recently passed away in Dec 03, 2021, and endorses not taking good care of his own health during this time. We discussed importance of better BP control and he understands. He is back to his usual lifestyle and seems to be committed to getting his health back in order. I refilled his antihypertensive medications today and will plan to see him again in 3 months.  Plan: -refilled norvasc and lisinopril -f/u in 3 months

## 2022-05-08 NOTE — Progress Notes (Signed)
   CC: f/u HTN  HPI:  [name] is a 56 y.o. male with history listed below presenting to the [facility] for f/u HTN. Please see individualized problem based charting for full HPI.  Past Medical History:  Diagnosis Date   Anogenital herpes simplex virus (HSV) infection    HIV (human immunodeficiency virus infection) ([place])    Hypertension     Review of Systems:  Negative aside from that listed in individualized problem based charting.  Physical Exam:  Vitals:   05/08/22 1512  BP: (!) 171/123  Pulse: 74  Temp: 98.1 F (36.7 C)  TempSrc: Oral  SpO2: 96%  Weight: 197 lb 3.2 oz (89.4 kg)   Physical Exam Constitutional:      Appearance: Normal appearance.     Comments: Overweight  HENT:     Mouth/Throat:     Mouth: Mucous membranes are moist.     Pharynx: Oropharynx is clear.  Eyes:     Extraocular Movements: Extraocular movements intact.     Conjunctiva/sclera: Conjunctivae normal.     Pupils: Pupils are equal, round, and reactive to light.  Cardiovascular:     Rate and Rhythm: Normal rate and regular rhythm.     Pulses: Normal pulses.     Heart sounds: Normal heart sounds. No murmur heard.    No gallop.  Pulmonary:     Effort: Pulmonary effort is normal.     Breath sounds: Normal breath sounds. No wheezing, rhonchi or rales.  Abdominal:     General: Bowel sounds are normal. There is no distension.     Palpations: Abdomen is soft.     Tenderness: There is no abdominal tenderness.  Musculoskeletal:        General: No swelling. Normal range of motion.  Skin:    General: Skin is warm and dry.  Neurological:     General: No focal deficit present.     Mental Status: He is alert and oriented to person, place, and time.  Psychiatric:        Mood and Affect: Mood normal.        Behavior: Behavior normal.      Assessment & Plan:   See Encounters Tab for problem based charting.  Patient discussed with Dr. Evette Doffing

## 2022-05-08 NOTE — Assessment & Plan Note (Signed)
Patient has not seen [facility] in 2 years but notes that he has been regularly taking his biktarvy during this time. He called to schedule appointment with [facility] during our visit today and is scheduled to see Dr. Baxter Flattery on 05/22/2022. We discussed importance of attending this visit to obtain lab work to ensure HIV has remained well controlled (previously undetectable viral load). He confirms understanding.  Plan: -f/u with Dr. Baxter Flattery on 05/22/2022 -continue biktarvy

## 2022-05-08 NOTE — Patient Instructions (Addendum)
Colin Alexander,  It was a pleasure seeing you in the clinic today.   I have refilled both of your blood pressure medicines and sent them to your pharmacy. Please pick these up and take them once a day every day. Please make sure to schedule your appointment with ID so that they can make sure everything is going well. Please come back in 3 months for your next visit.  Please call our clinic at [phone] if you have any questions or concerns. The best time to call is Monday-Friday from 9am-4pm, but there is someone available 24/7 at the same number. If you need medication refills, please notify your pharmacy one week in advance and they will send [place] a request.   Thank you for letting us take part in your care. We look forward to seeing you next time!

## 2022-05-09 NOTE — Progress Notes (Signed)
Internal Medicine Clinic Attending  Case discussed with Dr. Jinwala  At the time of the visit.  We reviewed the resident's history and exam and pertinent patient test results.  I agree with the assessment, diagnosis, and plan of care documented in the resident's note.  

## 2022-05-15 ENCOUNTER — Telehealth: Payer: Self-pay | Admitting: Student

## 2022-05-15 ENCOUNTER — Encounter: Payer: Self-pay | Admitting: Student

## 2022-05-15 NOTE — Telephone Encounter (Signed)
Pt seen 05/08/2022 and was started a new Medication that is making the patient dizzy last week when he started.  The pt had aches and stayed out of (online home classes) because he could not look at the screen.   The pt states he just started back classes yesterday after getting the BP medications into his system and now feels better.  The patient is now asking for a letter per his Teacher to excuse him from those days.

## 2022-05-22 ENCOUNTER — Other Ambulatory Visit (HOSPITAL_COMMUNITY)
Admission: RE | Admit: 2022-05-22 | Discharge: 2022-05-22 | Disposition: A | Discharge status: Home / Self Care | Payer: BC Managed Care – PPO | Source: Ambulatory Visit | Attending: Internal Medicine | Admitting: Internal Medicine

## 2022-05-22 ENCOUNTER — Other Ambulatory Visit: Payer: Self-pay

## 2022-05-22 ENCOUNTER — Encounter: Payer: Self-pay | Admitting: Internal Medicine

## 2022-05-22 ENCOUNTER — Ambulatory Visit: Payer: BC Managed Care – PPO | Admitting: Internal Medicine

## 2022-05-22 VITALS — BP 135/95 | HR 86 | Temp 97.9°F | Ht 68.0 in | Wt 186.0 lb

## 2022-05-22 DIAGNOSIS — Z79899 Other long term (current) drug therapy: Secondary | ICD-10-CM | POA: Diagnosis not present

## 2022-05-22 DIAGNOSIS — Z113 Encounter for screening for infections with a predominantly sexual mode of transmission: Secondary | ICD-10-CM | POA: Diagnosis not present

## 2022-05-22 DIAGNOSIS — B20 Human immunodeficiency virus [HIV] disease: Secondary | ICD-10-CM | POA: Insufficient documentation

## 2022-05-22 DIAGNOSIS — I10 Essential (primary) hypertension: Secondary | ICD-10-CM

## 2022-05-22 NOTE — Progress Notes (Signed)
RFV: follow up for hiv disease  [name], male   DOB: [birth year], 56 y.o.   MRN: [id number]  HPI 56yo M with HIV disease, labs has not done recently- 2021. 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 Less stress now but helping out with church  He briefly had intermittent break off of his medications due to being with family out of state?  He is more involved in church = will be [employer] [place] in 2 wk    Sochx = son living in [place], no relationships  Outpatient Encounter Medications as of 05/22/2022  Medication Sig   amLODipine (NORVASC) 10 MG tablet TAKE 1 TABLET(10 MG) BY MOUTH DAILY   bictegravir-emtricitabine-tenofovir AF (BIKTARVY) 50-200-25 MG TABS tablet Take 1 tablet by mouth daily.   lisinopril (ZESTRIL) 20 MG tablet TAKE 1 TABLET(20 MG) BY MOUTH DAILY   valACYclovir (VALTREX) 1000 MG tablet TAKE 1 TABLET(1000 MG) BY MOUTH DAILY   No facility-administered encounter medications on file as of 05/22/2022.     Patient Active Problem List   Diagnosis Date Noted   Healthcare maintenance 12/13/2021   Herpes genitalis    Family history of premature CAD 10/18/2012   HSV (herpes simplex virus) anogenital infection 06/07/2012   HIV (human immunodeficiency virus infection) ([place]) 01/13/2012   HTN (hypertension) 12/31/2011     Health Maintenance Due  Topic Date Due   Zoster Vaccines- Shingrix (1 of 2) Never done   COLONOSCOPY (Pts 45-77yr Insurance coverage will need to be confirmed)  Never done   COVID-19 Vaccine (2 - Pfizer risk series) 10/19/2020     Review of Systems 12 point ros is negative except what is mentioned above Physical Exam   BP (!) 135/95   Pulse 86   Temp 97.9 F (36.6 C) (Oral)   Ht '5\' 8"'$  (1.727 m)   Wt 186 lb (84.4 kg)   BMI 28.28 kg/m   Physical Exam  Constitutional: He is oriented to person, place, and time. He appears well-developed and well-nourished.  No distress.  HENT:  Mouth/Throat: Oropharynx is clear and moist. No oropharyngeal exudate.  Cardiovascular: Normal rate, regular rhythm and normal heart sounds. Exam reveals no gallop and no friction rub.  No murmur heard.  Pulmonary/Chest: Effort normal and breath sounds normal. No respiratory distress. He has no wheezes.  Lymphadenopathy:  He has no cervical adenopathy.  Neurological: He is alert and oriented to person, place, and time.  Skin: Skin is warm and dry. No rash noted. No erythema.  Psychiatric: He has a normal mood and affect. His behavior is normal.   Lab Results  Component Value Date   CD4TCELL 30 (L) 09/05/2020   Lab Results  Component Value Date   CD4TABS 445 09/05/2020   CD4TABS 569 03/22/2020   CD4TABS 589 09/22/2019   Lab Results  Component Value Date   HIV1RNAQUANT <20 (H) 09/05/2020   Lab Results  Component Value Date   HEPBSAB POS (A) 01/17/2014   Lab Results  Component Value Date   LABRPR NON-REACTIVE 09/05/2020    CBC Lab Results  Component Value Date   WBC 5.1 09/05/2020   RBC 4.59 09/05/2020   HGB 15.8 09/05/2020   HCT 45.4 09/05/2020   PLT 243 09/05/2020   MCV 98.9 09/05/2020   MCH 34.4 (H) 09/05/2020   MCHC 34.8 09/05/2020   RDW 11.8 09/05/2020   LYMPHSABS 1,510 09/05/2020  MONOABS 0.5 09/05/2019   EOSABS 209 09/05/2020    BMET Lab Results  Component Value Date   NA 141 12/13/2021   K 3.4 (L) 12/13/2021   CL 101 12/13/2021   CO2 23 12/13/2021   GLUCOSE 78 12/13/2021   BUN 8 12/13/2021   CREATININE 0.74 (L) 12/13/2021   CALCIUM 9.2 12/13/2021   GFRNONAA 86 09/05/2020   GFRAA 100 09/05/2020      Assessment and Plan Hiv  = discussed importance of adherence. Will check labs And provide refills  Long term medication management = will check cr to see that it is still stable  Hsv proph = no outbreaks recently. Will refill valtrex  Htn = had dose adjustement by pcp, dr Allyson Sabal. BP is 130s. Today appears to be closer  to goal.

## 2022-05-23 LAB — URINE CYTOLOGY ANCILLARY ONLY
Chlamydia: NEGATIVE
Comment: NEGATIVE
Comment: NORMAL
Neisseria Gonorrhea: NEGATIVE

## 2022-05-23 LAB — T-HELPER CELL (CD4) - (RCID CLINIC ONLY)
CD4 % Helper T Cell: 27 % — ABNORMAL LOW (ref 33–65)
CD4 T Cell Abs: 413 /uL (ref 400–1790)

## 2022-05-27 LAB — COMPLETE METABOLIC PANEL WITH GFR
AG Ratio: 1.4 (calc) (ref 1.0–2.5)
ALT: 26 U/L (ref 9–46)
AST: 19 U/L (ref 10–35)
Albumin: 4.5 g/dL (ref 3.6–5.1)
Alkaline phosphatase (APISO): 43 U/L (ref 35–144)
BUN: 8 mg/dL (ref 7–25)
CO2: 30 mmol/L (ref 20–32)
Calcium: 9.5 mg/dL (ref 8.6–10.3)
Chloride: 103 mmol/L (ref 98–110)
Creat: 1.04 mg/dL (ref 0.70–1.30)
Globulin: 3.2 g/dL (calc) (ref 1.9–3.7)
Glucose, Bld: 91 mg/dL (ref 65–99)
Potassium: 3.1 mmol/L — ABNORMAL LOW (ref 3.5–5.3)
Sodium: 141 mmol/L (ref 135–146)
Total Bilirubin: 0.9 mg/dL (ref 0.2–1.2)
Total Protein: 7.7 g/dL (ref 6.1–8.1)
eGFR: 84 mL/min/{1.73_m2} (ref >=60)

## 2022-05-27 LAB — CBC WITH DIFFERENTIAL/PLATELET
Absolute Monocytes: 390 cells/uL (ref 200–950)
Basophils Absolute: 52 cells/uL (ref 0–200)
Basophils Relative: 1 %
Eosinophils Absolute: 151 cells/uL (ref 15–500)
Eosinophils Relative: 2.9 %
HCT: 43.3 % (ref 38.5–50.0)
Hemoglobin: 15 g/dL (ref 13.2–17.1)
Lymphs Abs: 1612 cells/uL (ref 850–3900)
MCH: 33.6 pg — ABNORMAL HIGH (ref 27.0–33.0)
MCHC: 34.6 g/dL (ref 32.0–36.0)
MCV: 97.1 fL (ref 80.0–100.0)
MPV: 10.8 fL (ref 7.5–12.5)
Monocytes Relative: 7.5 %
Neutro Abs: 2995 cells/uL (ref 1500–7800)
Neutrophils Relative %: 57.6 %
Platelets: 254 10*3/uL (ref 140–400)
RBC: 4.46 10*6/uL (ref 4.20–5.80)
RDW: 11.3 % (ref 11.0–15.0)
Total Lymphocyte: 31 %
WBC: 5.2 10*3/uL (ref 3.8–10.8)

## 2022-05-27 LAB — RPR: RPR Ser Ql: NONREACTIVE

## 2022-05-27 LAB — HIV RNA, RTPCR W/R GT (RTI, PI,INT)
HIV 1 RNA Quant: 243 copies/mL — ABNORMAL HIGH
HIV-1 RNA Quant, Log: 2.39 Log copies/mL — ABNORMAL HIGH

## 2022-05-27 LAB — LIPID PANEL
Cholesterol: 138 mg/dL (ref <200)
HDL: 35 mg/dL — ABNORMAL LOW (ref >=40)
LDL Cholesterol (Calc): 87 mg/dL (calc)
Non-HDL Cholesterol (Calc): 103 mg/dL (calc) (ref <130)
Total CHOL/HDL Ratio: 3.9 (calc) (ref <5.0)
Triglycerides: 73 mg/dL (ref <150)

## 2022-05-27 LAB — HIV-1 RNA QUANT-NO REFLEX-BLD
HIV 1 RNA Quant: 206 Copies/mL — ABNORMAL HIGH
HIV-1 RNA Quant, Log: 2.31 Log cps/mL — ABNORMAL HIGH

## 2022-06-11 ENCOUNTER — Ambulatory Visit: Payer: BC Managed Care – PPO | Admitting: Internal Medicine

## 2022-06-11 ENCOUNTER — Other Ambulatory Visit: Payer: Self-pay

## 2022-06-11 ENCOUNTER — Encounter: Payer: Self-pay | Admitting: Internal Medicine

## 2022-06-11 ENCOUNTER — Ambulatory Visit (HOSPITAL_COMMUNITY)
Admission: RE | Admit: 2022-06-11 | Discharge: 2022-06-11 | Disposition: A | Discharge status: Home / Self Care | Payer: BC Managed Care – PPO | Source: Ambulatory Visit | Attending: Student in an Organized Health Care Education/Training Program | Admitting: Student in an Organized Health Care Education/Training Program

## 2022-06-11 VITALS — BP 140/101 | HR 95 | Temp 98.2°F | Resp 18 | Ht 68.0 in | Wt 191.6 lb

## 2022-06-11 DIAGNOSIS — G8929 Other chronic pain: Secondary | ICD-10-CM | POA: Diagnosis not present

## 2022-06-11 DIAGNOSIS — M25552 Pain in left hip: Secondary | ICD-10-CM | POA: Insufficient documentation

## 2022-06-11 NOTE — Progress Notes (Signed)
Internal Medicine Clinic Attending ° °Case discussed with Dr. Atway  At the time of the visit.  We reviewed the resident’s history and exam and pertinent patient test results.  I agree with the assessment, diagnosis, and plan of care documented in the resident’s note.  °

## 2022-06-11 NOTE — Progress Notes (Signed)
CC: leg and back pain  HPI:  Colin Alexander is a 56 y.o. male living with a history stated below and presents today for leg and back pain. Please see problem based assessment and plan for additional details.  Past Medical History:  Diagnosis Date   Anogenital herpes simplex virus (HSV) infection    HIV (human immunodeficiency virus infection) ([place])    Hypertension     Current Outpatient Medications on File Prior to Visit  Medication Sig Dispense Refill   amLODipine (NORVASC) 10 MG tablet TAKE 1 TABLET(10 MG) BY MOUTH DAILY 90 tablet 3   bictegravir-emtricitabine-tenofovir AF (BIKTARVY) 50-200-25 MG TABS tablet Take 1 tablet by mouth daily. 30 tablet 0   lisinopril (ZESTRIL) 20 MG tablet TAKE 1 TABLET(20 MG) BY MOUTH DAILY 90 tablet 3   valACYclovir (VALTREX) 1000 MG tablet TAKE 1 TABLET(1000 MG) BY MOUTH DAILY 30 tablet 5   No current facility-administered medications on file prior to visit.    Family History  Problem Relation Age of Onset   Heart attack Mother        Stent placement   Hypertension Mother    Heart attack Father        CABG   Hypertension Father    Heart attack Cousin        In his 35's deceased.    Social History   Socioeconomic History   Marital status: Divorced    Spouse name: Not on file   Number of children: Not on file   Years of education: Not on file   Highest education level: Not on file  Occupational History   Not on file  Tobacco Use   Smoking status: Never   Smokeless tobacco: Never  Vaping Use   Vaping Use: Never used  Substance and Sexual Activity   Alcohol use: No    Alcohol/week: 0.0 standard drinks of alcohol   Drug use: No   Sexual activity: Yes    Birth control/protection: Condom    Comment: declined condoms  Other Topics Concern   Not on file  Social History Narrative   Not on file   Social Determinants of Health   Financial Resource Strain: Not on file  Food Insecurity: Not on file  Transportation Needs:  Not on file  Physical Activity: Not on file  Stress: Not on file  Social Connections: Not on file  Intimate Partner Violence: Not on file    Review of Systems: ROS negative except for what is noted on the assessment and plan.  Vitals:   06/11/22 1003 06/11/22 1007 06/11/22 1016  BP:  (!) 150/101 (!) 140/101  Pulse:  95   Resp:  18   Temp:  98.2 F (36.8 C)   TempSrc:  Oral   SpO2:  97%   Weight: 191 lb 9.6 oz (86.9 kg) 191 lb 9.6 oz (86.9 kg)   Height:  '5\' 8"'$  (1.727 m)     Physical Exam: Constitutional: well-appearing male sitting in chair, in no acute distress Cardiovascular: regular rate and rhythm, no m/r/g Pulmonary/Chest: normal work of breathing on room air, lungs clear to auscultation bilaterally Abdominal: soft, non-tender, non-distended MSK: normal bulk and tone. Windshield wiper test - pain with external rotation leg (internal rotation of femoral head) Neurological: alert & oriented x 3, no focal deficit Skin: warm and dry Psych: normal mood and behavior  Assessment & Plan:     Patient discussed with Dr. Evette Doffing  Chronic left hip pain The patient presents to  the [facility] today to discuss left hip pain that has been ongoing for >4 months. The patient states that the pain is in his left hip and radiates to his groin. This pain has been so severe that it has limited his ability to walk (of note, the patient is a student at [employer] and has not been able to get to his classes because of this pain). He does not have any back pain or pain in his other hip. 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   On exam, the patient had a positive windshield wiper test. With external rotation of the patient's leg, he had reproducible pain (internal rotation of femoral head). With his history of congenital hip dysplasia, I am unsure if this put's the patient at a higher risk of develop osteoarthritis at a  younger age, but this certainly could be. Given that this pain is limiting the patient's ability to go to school and work, we will refer him to orthopedic surgery.   Plan: - Hip xray today - Referral to ortho   Naimah Yingst, D.O. [facility], PGY-2 Phone: [phone] Date 06/11/2022 Time 2:03 PM

## 2022-06-11 NOTE — Assessment & Plan Note (Signed)
The patient presents to the [facility] today to discuss left hip pain that has been ongoing for >4 months. The patient states that the pain is in his left hip and radiates to his groin. This pain has been so severe that it has limited his ability to walk (of note, the patient is a student at [employer] and has not been able to get to his classes because of this pain). He does not have any back pain or pain in his other hip. 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   On exam, the patient had a positive windshield wiper test. With external rotation of the patient's leg, he had reproducible pain (internal rotation of femoral head). With his history of congenital hip dysplasia, I am unsure if this put's the patient at a higher risk of develop osteoarthritis at a younger age, but this certainly could be. Given that this pain is limiting the patient's ability to go to school and work, we will refer him to orthopedic surgery.   Plan: - Hip xray today - Referral to ortho  Addendum 8/3: No signs of osteoarthritis on hip xray and no acute findings, just evidence of congenital hip dysplasia as noted back on xray in 2013. Patient made aware and will follow up with ortho.

## 2022-06-11 NOTE — Patient Instructions (Signed)
Thank you, [name] for allowing [place] to provide your care today. Today we discussed:  Hip pain: We are getting an xray of your hip and referring you to see the orthopedist. Continue to take tylenol as needed for your pain   I have ordered the following labs for you:  Lab Orders  No laboratory test(s) ordered today     Tests ordered today:  Hip xray  Referrals ordered today:    Referral Orders         Ambulatory referral to Orthopedic Surgery      I have ordered the following medication/changed the following medications:   Stop the following medications: There are no discontinued medications.   Start the following medications: No orders of the defined types were placed in this encounter.    Follow up:  as needed for hip pain      Should you have any questions or concerns please call the internal medicine clinic at [phone].     Buddy Duty, D.O. [place]

## 2022-06-18 ENCOUNTER — Ambulatory Visit: Payer: BC Managed Care – PPO | Admitting: Orthopaedic Surgery

## 2022-06-18 DIAGNOSIS — M9112 Juvenile osteochondrosis of head of femur [Legg-Calve-Perthes], left leg: Secondary | ICD-10-CM | POA: Insufficient documentation

## 2022-06-18 NOTE — Progress Notes (Signed)
Office Visit Note   Patient: Colin Alexander           Date of Birth: Aug 28, 1966           MRN: [id number] Visit Date: 06/18/2022              Requested by: Axel Filler, MD [address] [place],  [place] [address] PCP: Virl Axe, MD   Assessment & Plan: Visit Diagnoses:  1. Perthes' disease, left     Plan: Impression is bilateral Perthes disease.  No degenerative changes to the joint space.  He is very functional and reports only occasional pain.  From my standpoint he can follow-up as needed.  Follow-Up Instructions: No follow-ups on file.   Orders:  No orders of the defined types were placed in this encounter.  No orders of the defined types were placed in this encounter.     Procedures: No procedures performed   Clinical Data: No additional findings.   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  He has known for the disease.  He states that his hips have not cause any problems and does not interfere with his daily activities or quality of life.  He is very functional.  He can dance and he can walk and he can work.  He currently describes no pain.  He mainly wants to get regular checkup to make sure that his hips are stable.  Review of Systems  Constitutional: Negative.   All other systems reviewed and are negative.    Objective: Vital Signs: There were no vitals taken for this visit.  Physical Exam Vitals and nursing note reviewed.  Constitutional:      Appearance: He is well-developed.  HENT:     Head: Normocephalic and atraumatic.  Eyes:     Pupils: Pupils are equal, round, and reactive to light.  Pulmonary:     Effort: Pulmonary effort is normal.  Abdominal:     Palpations: Abdomen is soft.  Musculoskeletal:        General: Normal range of motion.     Cervical back: Neck supple.  Skin:    General: Skin  is warm.  Neurological:     Mental Status: He is alert and oriented to person, place, and time.  Psychiatric:        Behavior: Behavior normal.        Thought Content: Thought content normal.        Judgment: Judgment normal.     Ortho Exam Examination of left hip shows functional range of motion.  He has normal gait and ambulation.  No limping. Specialty Comments:  No specialty comments available.  Imaging: No results found.   PMFS History: Patient Active Problem List   Diagnosis Date Noted   Perthes' disease, left 06/18/2022   Chronic left hip pain 06/11/2022   Healthcare maintenance 12/13/2021   Herpes genitalis    Family history of premature CAD 10/18/2012   HSV (herpes simplex virus) anogenital infection 06/07/2012   HIV (human immunodeficiency virus infection) ([place]) 01/13/2012   HTN (hypertension) 12/31/2011   Past Medical History:  Diagnosis Date   Anogenital herpes simplex virus (HSV) infection    HIV (human immunodeficiency virus infection) ([place])    Hypertension     Family History  Problem Relation Age of Onset  Heart attack Mother        Stent placement   Hypertension Mother    Heart attack Father        CABG   Hypertension Father    Heart attack Cousin        In his 35's deceased.    Past Surgical History:  Procedure Laterality Date   CIRCUMCISION     HERNIA REPAIR     Social History   Occupational History   Not on file  Tobacco Use   Smoking status: Never   Smokeless tobacco: Never  Vaping Use   Vaping Use: Never used  Substance and Sexual Activity   Alcohol use: No    Alcohol/week: 0.0 standard drinks of alcohol   Drug use: No   Sexual activity: Yes    Birth control/protection: Condom    Comment: declined condoms

## 2022-07-15 ENCOUNTER — Other Ambulatory Visit: Payer: Self-pay | Admitting: Internal Medicine

## 2022-07-15 DIAGNOSIS — B20 Human immunodeficiency virus [HIV] disease: Secondary | ICD-10-CM

## 2022-07-21 ENCOUNTER — Telehealth: Payer: Self-pay

## 2022-07-21 ENCOUNTER — Ambulatory Visit: Payer: BC Managed Care – PPO | Admitting: Internal Medicine

## 2022-07-21 VITALS — BP 129/90 | HR 95 | Temp 97.6°F | Ht 68.0 in | Wt 194.0 lb

## 2022-07-21 DIAGNOSIS — Z0000 Encounter for general adult medical examination without abnormal findings: Secondary | ICD-10-CM

## 2022-07-21 DIAGNOSIS — Z23 Encounter for immunization: Secondary | ICD-10-CM

## 2022-07-21 DIAGNOSIS — J310 Chronic rhinitis: Secondary | ICD-10-CM | POA: Diagnosis not present

## 2022-07-21 DIAGNOSIS — J329 Chronic sinusitis, unspecified: Secondary | ICD-10-CM

## 2022-07-21 MED ORDER — FLUTICASONE PROPIONATE 50 MCG/ACT NA SUSP: 1.0000 | Freq: Every day | NASAL | 2 refills | Status: DC

## 2022-07-21 MED ORDER — SALINE SPRAY 0.65 % NA SOLN: 1.0000 | NASAL | 0 refills | Status: DC | PRN

## 2022-07-21 NOTE — Telephone Encounter (Signed)
Requesting to speak with a nurse about problem with sinus. Pt would like to know what medication can he take. Please call pt back.

## 2022-07-21 NOTE — Progress Notes (Unsigned)
Subjective:  CC: sinus pressure  HPI:  Colin Alexander is a 56 y.o. male with a past medical history stated below and presents today for 2 weeks of sinus pressure. Please see problem based assessment and plan for additional details.  Past Medical History:  Diagnosis Date   Anogenital herpes simplex virus (HSV) infection    HIV (human immunodeficiency virus infection) ([place])    Hypertension     Current Outpatient Medications on File Prior to Visit  Medication Sig Dispense Refill   amLODipine (NORVASC) 10 MG tablet TAKE 1 TABLET(10 MG) BY MOUTH DAILY 90 tablet 3   BIKTARVY 50-200-25 MG TABS tablet TAKE 1 TABLET BY MOUTH DAILY. 30 tablet 1   lisinopril (ZESTRIL) 20 MG tablet TAKE 1 TABLET(20 MG) BY MOUTH DAILY 90 tablet 3   valACYclovir (VALTREX) 1000 MG tablet TAKE 1 TABLET(1000 MG) BY MOUTH DAILY 30 tablet 5   No current facility-administered medications on file prior to visit.    Family History  Problem Relation Age of Onset   Heart attack Mother        Stent placement   Hypertension Mother    Heart attack Father        CABG   Hypertension Father    Heart attack Cousin        In his 23's deceased.    Social History   Socioeconomic History   Marital status: Divorced    Spouse name: Not on file   Number of children: Not on file   Years of education: Not on file   Highest education level: Not on file  Occupational History   Not on file  Tobacco Use   Smoking status: Never   Smokeless tobacco: Never  Vaping Use   Vaping Use: Never used  Substance and Sexual Activity   Alcohol use: No    Alcohol/week: 0.0 standard drinks of alcohol   Drug use: No   Sexual activity: Yes    Birth control/protection: Condom    Comment: declined condoms  Other Topics Concern   Not on file  Social History Narrative   Not on file   Social Determinants of Health   Financial Resource Strain: Not on file  Food Insecurity: Not on file  Transportation Needs: Not on file   Physical Activity: Not on file  Stress: Not on file  Social Connections: Not on file  Intimate Partner Violence: Not on file    Review of Systems: ROS negative except for what is noted on the assessment and plan.  Objective:   Vitals:   07/21/22 1437  BP: (!) 129/90  Pulse: 95  Temp: 97.6 F (36.4 C)  TempSrc: Oral  SpO2: 100%  Weight: 194 lb (88 kg)  Height: '5\' 8"'$  (1.727 m)    Physical Exam: Constitutional: well-appearing, in no acute distress HENT: cobblestoning of nasopharyngeal mucosa Cardiovascular: regular rate and rhythm, no m/r/g Pulmonary/Chest: normal work of breathing on room air, lungs clear to auscultation bilaterally Skin: warm and dry Psych: normal mood and affect     Assessment & Plan:  Rhinosinusitis Patient presenting with 2 weeks of nasal congestion. He states that he went outside to work and then developed runny nose and sinus pressure. He has been using mucinex and advil sinus and cold without improvement in his symptoms. He denies fever, chills, cough, and shortness of breath. He has been eating and drinking normally. On exam cobblestoning present to nasopharyngeal mucosa bilaterally. A/P: Consistent with rhinosinusitis.  -flonase spray -nasal  saline irrigation -follow-up in 1 week if no improvement   Healthcare maintenance Flu vaccine given today    Patient discussed with Dr. Walden Field Klarissa Mcilvain, D.O. [facility]  PGY-2 Pager: [phone]  Phone: [phone] Date 07/22/2022  Time 8:05 AM

## 2022-07-21 NOTE — Telephone Encounter (Signed)
Return pt's call. Stated he has sinus problems x 2 weeks; worse over the w/e. Stated the phlegm is brown and green. He tried Mucinex then Advil cold and Sinus. No available appts on yellow team. Appt given today 9/11 with Dr Howie Ill @ 1415 PM.

## 2022-07-21 NOTE — Patient Instructions (Signed)
Thank you, [name] for allowing [place] to provide your care today.  Sinus pressure: I think you have what is called allergic rhinosinusitis. Please use a steroid nasal spray (Flonase) daily. This is over the counter. Also consider adding a saline spray or using a Neti pot to help get mucus out. You can continue taking mucinex.  If no better in one week please follow-up.  I have ordered the following labs for you:  Lab Orders  No laboratory test(s) ordered today     Referrals ordered today:   Referral Orders  No referral(s) requested today     I have ordered the following medication/changed the following medications:   Stop the following medications: There are no discontinued medications.   Start the following medications: No orders of the defined types were placed in this encounter.    Follow up:  1 week if no improvement    We look forward to seeing you next time. Please call our clinic at [phone] if you have any questions or concerns. The best time to call is Monday-Friday from 9am-4pm, but there is someone available 24/7. If after hours or the weekend, call the main hospital number and ask for the Internal Medicine Resident On-Call. If you need medication refills, please notify your pharmacy one week in advance and they will send [place] a request.   Thank you for trusting me with your care. Wishing you the best!   Christiana Fuchs, [place]

## 2022-07-22 DIAGNOSIS — J310 Chronic rhinitis: Secondary | ICD-10-CM | POA: Insufficient documentation

## 2022-07-22 NOTE — Assessment & Plan Note (Signed)
Flu vaccine given today. 

## 2022-07-22 NOTE — Assessment & Plan Note (Signed)
Patient presenting with 2 weeks of nasal congestion. He states that he went outside to work and then developed runny nose and sinus pressure. He has been using mucinex and advil sinus and cold without improvement in his symptoms. He denies fever, chills, cough, and shortness of breath. He has been eating and drinking normally. On exam cobblestoning present to nasopharyngeal mucosa bilaterally. A/P: Consistent with rhinosinusitis.  -flonase spray -nasal saline irrigation -follow-up in 1 week if no improvement

## 2022-07-24 NOTE — Progress Notes (Signed)
Internal Medicine Clinic Attending  Case discussed with Dr. Masters  at the time of the visit.  We reviewed the resident's history and exam and pertinent patient test results.  I agree with the assessment, diagnosis, and plan of care documented in the resident's note.  

## 2022-09-02 ENCOUNTER — Ambulatory Visit (INDEPENDENT_AMBULATORY_CARE_PROVIDER_SITE_OTHER): Payer: BC Managed Care – PPO

## 2022-09-02 ENCOUNTER — Other Ambulatory Visit: Payer: Self-pay

## 2022-09-02 ENCOUNTER — Ambulatory Visit (INDEPENDENT_AMBULATORY_CARE_PROVIDER_SITE_OTHER): Payer: BC Managed Care – PPO | Admitting: Internal Medicine

## 2022-09-02 VITALS — BP 132/91 | HR 68 | Resp 16 | Ht 68.0 in | Wt 194.0 lb

## 2022-09-02 DIAGNOSIS — Z23 Encounter for immunization: Secondary | ICD-10-CM

## 2022-09-02 DIAGNOSIS — B20 Human immunodeficiency virus [HIV] disease: Secondary | ICD-10-CM | POA: Diagnosis not present

## 2022-09-02 DIAGNOSIS — Z79899 Other long term (current) drug therapy: Secondary | ICD-10-CM | POA: Diagnosis not present

## 2022-09-02 DIAGNOSIS — B009 Herpesviral infection, unspecified: Secondary | ICD-10-CM

## 2022-09-02 MED ORDER — BIKTARVY 50-200-25 MG PO TABS: 1.0000 | ORAL_TABLET | Freq: Every day | ORAL | 11 refills | Status: DC

## 2022-09-02 MED ORDER — VALACYCLOVIR HCL 1 G PO TABS: ORAL_TABLET | ORAL | 5 refills | Status: DC

## 2022-09-02 MED ORDER — PITAVASTATIN CALCIUM 4 MG PO TABS: 4.0000 mg | ORAL_TABLET | Freq: Every day | ORAL | 11 refills | Status: DC

## 2022-09-02 NOTE — Progress Notes (Signed)
Patient ID: LOCRYN BIGGERS, male   DOB: 06-Jan-1966, 56 y.o.   MRN: [id number]  HPI Colin Alexander is a 56yo M with well controlled hiv disease, CD 4 count of 413/VL 203 in July 2023, on biktarvy. Hx of syphilis. Overall doing well. No complaints with her health.  Outpatient Encounter Medications as of 09/02/2022  Medication Sig   amLODipine (NORVASC) 10 MG tablet TAKE 1 TABLET(10 MG) BY MOUTH DAILY   BIKTARVY 50-200-25 MG TABS tablet TAKE 1 TABLET BY MOUTH DAILY.   fluticasone (FLONASE) 50 MCG/ACT nasal spray Place 1 spray into both nostrils daily.   lisinopril (ZESTRIL) 20 MG tablet TAKE 1 TABLET(20 MG) BY MOUTH DAILY   sodium chloride (OCEAN) 0.65 % SOLN nasal spray Place 1 spray into both nostrils as needed for congestion.   valACYclovir (VALTREX) 1000 MG tablet TAKE 1 TABLET(1000 MG) BY MOUTH DAILY   No facility-administered encounter medications on file as of 09/02/2022.     Patient Active Problem List   Diagnosis Date Noted   Rhinosinusitis 07/22/2022   Perthes' disease, left 06/18/2022   Chronic left hip pain 06/11/2022   Healthcare maintenance 12/13/2021   Herpes genitalis    Family history of premature CAD 10/18/2012   HSV (herpes simplex virus) anogenital infection 06/07/2012   HIV (human immunodeficiency virus infection) ([place]) 01/13/2012   HTN (hypertension) 12/31/2011     Health Maintenance Due  Topic Date Due   Zoster Vaccines- Shingrix (1 of 2) Never done   COLONOSCOPY (Pts 45-60yr Insurance coverage will need to be confirmed)  Never done   COVID-19 Vaccine (2 - Pfizer risk series) 10/19/2020     Review of Systems Review of Systems  Constitutional: Negative for fever, chills, diaphoresis, activity change, appetite change, fatigue and unexpected weight change.  HENT: Negative for congestion, sore throat, rhinorrhea, sneezing, trouble swallowing and sinus pressure.  Eyes: Negative for photophobia and visual disturbance.  Respiratory: Negative for cough, chest  tightness, shortness of breath, wheezing and stridor.  Cardiovascular: Negative for chest pain, palpitations and leg swelling.  Gastrointestinal: Negative for nausea, vomiting, abdominal pain, diarrhea, constipation, blood in stool, abdominal distention and anal bleeding.  Genitourinary: Negative for dysuria, hematuria, flank pain and difficulty urinating.  Musculoskeletal: Negative for myalgias, back pain, joint swelling, arthralgias and gait problem.  Skin: Negative for color change, pallor, rash and wound.  Neurological: Negative for dizziness, tremors, weakness and light-headedness.  Hematological: Negative for adenopathy. Does not bruise/bleed easily.  Psychiatric/Behavioral: Negative for behavioral problems, confusion, sleep disturbance, dysphoric mood, decreased concentration and agitation.   Physical Exam  Physical Exam  Constitutional: He is oriented to person, place, and time. He appears well-developed and well-nourished. No distress.  HENT:  Mouth/Throat: Oropharynx is clear and moist. No oropharyngeal exudate.  Cardiovascular: Normal rate, regular rhythm and normal heart sounds. Exam reveals no gallop and no friction rub.  No murmur heard.  Pulmonary/Chest: Effort normal and breath sounds normal. No respiratory distress. He has no wheezes.  Abdominal: Soft. Bowel sounds are normal. He exhibits no distension. There is no tenderness.  Lymphadenopathy:  He has no cervical adenopathy.  Neurological: He is alert and oriented to person, place, and time.  Skin: Skin is warm and dry. No rash noted. No erythema.  Psychiatric: He has a normal mood and affect. His behavior is normal.    Lab Results  Component Value Date   CD4TCELL 27 (L) 05/22/2022   Lab Results  Component Value Date   CD4TABS 413 05/22/2022  CD4TABS 445 09/05/2020   CD4TABS 569 03/22/2020   Lab Results  Component Value Date   HIV1RNAQUANT 206 (H) 05/22/2022   HIV1RNAQUANT 243 (H) 05/22/2022   Lab Results   Component Value Date   HEPBSAB POS (A) 01/17/2014   Lab Results  Component Value Date   LABRPR NON-REACTIVE 05/22/2022    CBC Lab Results  Component Value Date   WBC 5.2 05/22/2022   RBC 4.46 05/22/2022   HGB 15.0 05/22/2022   HCT 43.3 05/22/2022   PLT 254 05/22/2022   MCV 97.1 05/22/2022   MCH 33.6 (H) 05/22/2022   MCHC 34.6 05/22/2022   RDW 11.3 05/22/2022   LYMPHSABS 1,612 05/22/2022   MONOABS 0.5 09/05/2019   EOSABS 151 05/22/2022    BMET Lab Results  Component Value Date   NA 141 05/22/2022   K 3.1 (L) 05/22/2022   CL 103 05/22/2022   CO2 30 05/22/2022   GLUCOSE 91 05/22/2022   BUN 8 05/22/2022   CREATININE 1.04 05/22/2022   CALCIUM 9.5 05/22/2022   GFRNONAA 86 09/05/2020   GFRAA 100 09/05/2020      Assessment and Plan Htn = good control while taking meds;  132/91 Hiv =will do labs, and covid vaccine. Gave refills  Long term medicaiton management = will check cr  Health maintenance =  Started in pitavastatin '4mg'$  for reprieve (also has family hx)

## 2022-09-03 LAB — T-HELPER CELL (CD4) - (RCID CLINIC ONLY)
CD4 % Helper T Cell: 28 % — ABNORMAL LOW (ref 33–65)
CD4 T Cell Abs: 486 /uL (ref 400–1790)

## 2022-09-04 ENCOUNTER — Other Ambulatory Visit (HOSPITAL_COMMUNITY): Payer: Self-pay

## 2022-09-04 ENCOUNTER — Telehealth: Payer: Self-pay

## 2022-09-04 LAB — CBC WITH DIFFERENTIAL/PLATELET
Absolute Monocytes: 439 cells/uL (ref 200–950)
Basophils Absolute: 73 cells/uL (ref 0–200)
Basophils Relative: 1.2 %
Eosinophils Absolute: 311 cells/uL (ref 15–500)
Eosinophils Relative: 5.1 %
HCT: 42.6 % (ref 38.5–50.0)
Hemoglobin: 15 g/dL (ref 13.2–17.1)
Lymphs Abs: 1867 cells/uL (ref 850–3900)
MCH: 34.2 pg — ABNORMAL HIGH (ref 27.0–33.0)
MCHC: 35.2 g/dL (ref 32.0–36.0)
MCV: 97 fL (ref 80.0–100.0)
MPV: 10.9 fL (ref 7.5–12.5)
Monocytes Relative: 7.2 %
Neutro Abs: 3410 cells/uL (ref 1500–7800)
Neutrophils Relative %: 55.9 %
Platelets: 251 10*3/uL (ref 140–400)
RBC: 4.39 10*6/uL (ref 4.20–5.80)
RDW: 11.8 % (ref 11.0–15.0)
Total Lymphocyte: 30.6 %
WBC: 6.1 10*3/uL (ref 3.8–10.8)

## 2022-09-04 LAB — LIPID PANEL
Cholesterol: 157 mg/dL (ref <200)
HDL: 41 mg/dL (ref >=40)
LDL Cholesterol (Calc): 94 mg/dL (calc)
Non-HDL Cholesterol (Calc): 116 mg/dL (calc) (ref <130)
Total CHOL/HDL Ratio: 3.8 (calc) (ref <5.0)
Triglycerides: 119 mg/dL (ref <150)

## 2022-09-04 LAB — COMPLETE METABOLIC PANEL WITH GFR
AG Ratio: 1.5 (calc) (ref 1.0–2.5)
ALT: 18 U/L (ref 9–46)
AST: 16 U/L (ref 10–35)
Albumin: 4.5 g/dL (ref 3.6–5.1)
Alkaline phosphatase (APISO): 43 U/L (ref 35–144)
BUN: 8 mg/dL (ref 7–25)
CO2: 31 mmol/L (ref 20–32)
Calcium: 9.3 mg/dL (ref 8.6–10.3)
Chloride: 101 mmol/L (ref 98–110)
Creat: 0.98 mg/dL (ref 0.70–1.30)
Globulin: 3 g/dL (calc) (ref 1.9–3.7)
Glucose, Bld: 92 mg/dL (ref 65–99)
Potassium: 3.4 mmol/L — ABNORMAL LOW (ref 3.5–5.3)
Sodium: 139 mmol/L (ref 135–146)
Total Bilirubin: 0.6 mg/dL (ref 0.2–1.2)
Total Protein: 7.5 g/dL (ref 6.1–8.1)
eGFR: 90 mL/min/{1.73_m2} (ref >=60)

## 2022-09-04 LAB — HIV-1 RNA QUANT-NO REFLEX-BLD
HIV 1 RNA Quant: NOT DETECTED Copies/mL
HIV-1 RNA Quant, Log: NOT DETECTED Log cps/mL

## 2022-09-04 LAB — RPR: RPR Ser Ql: NONREACTIVE

## 2022-09-04 NOTE — Telephone Encounter (Signed)
PA for pitavastatin submitted via covermymeds. Awaiting response.   (Key: [id number])  Beryle Flock, RN

## 2022-09-05 ENCOUNTER — Other Ambulatory Visit (HOSPITAL_COMMUNITY): Payer: Self-pay

## 2022-09-09 ENCOUNTER — Other Ambulatory Visit: Payer: Self-pay | Admitting: Internal Medicine

## 2022-09-09 DIAGNOSIS — B20 Human immunodeficiency virus [HIV] disease: Secondary | ICD-10-CM

## 2022-09-09 NOTE — Telephone Encounter (Signed)
PA approved 09/04/22-09/04/25. Approval faxed to [facility].   Beryle Flock, RN

## 2022-09-23 ENCOUNTER — Ambulatory Visit (INDEPENDENT_AMBULATORY_CARE_PROVIDER_SITE_OTHER): Payer: BC Managed Care – PPO | Admitting: Student

## 2022-09-23 DIAGNOSIS — H9202 Otalgia, left ear: Secondary | ICD-10-CM | POA: Diagnosis not present

## 2022-09-23 DIAGNOSIS — H9203 Otalgia, bilateral: Secondary | ICD-10-CM | POA: Insufficient documentation

## 2022-09-23 HISTORY — DX: Otalgia, bilateral: H92.03

## 2022-09-23 NOTE — Assessment & Plan Note (Addendum)
Patient reports left ear pain for about 2 weeks. Pain is sharp and constant inside the left ear. Left ear does not feel full and no swelling, rash or tenderness. Pain aggravated with chewing. No tenderness along TMJ. Endorses headache, nasal congestion and sneezing. Denies fever, chills, n/v, cough, sore throat or tinnitus. No recent sick contacts. No drainage but has noticed some earwax coming out. Tried Advil sinus with minimal relief. He rates pain as 5/10. When patient tugged on left ear lobe, some pain felt in inner ear. No pain behind the ear. Patient sounded congested with sniffling over the phone visit.  Likely viral sinusitis but considered otitis media, otitis externa or mastoiditis. Discussed continuing conservative care and starting Flonase. Has Rx for Flonase from prior visit that he has not picked up, but will pick it up today. Return precautions given.   Plan -start Flonase -continue conservative care for now, if worsening or no improvement then return for in-person visit

## 2022-09-23 NOTE — Progress Notes (Signed)
   CC: left ear pain  This is a telephone encounter between Colin Alexander and Colin Alexander on 09/23/2022 for left ear pain. The visit was conducted with the patient located at home and Colin Alexander at [facility]. The patient's identity was confirmed using their DOB and current address. The patient has consented to being evaluated through a telephone encounter and understands the associated risks (an examination cannot be done and the patient may need to come in for an appointment) / benefits (allows the patient to remain at home, decreasing exposure to coronavirus). I personally spent 13 minutes on medical discussion.   HPI:  [name] is a 56 y.o. with PMH as below.   Please see A&P for assessment of the patient's acute and chronic medical conditions.   Past Medical History:  Diagnosis Date   Anogenital herpes simplex virus (HSV) infection    HIV (human immunodeficiency virus infection) ([place])    Hypertension    Review of Systems: Positive headache, left ear pain, nasal congestion, sneezing. Negative fever, chills, n/v, cough, sore throat, tinnitus, ear swelling, rash.    Assessment & Plan:   Acute ear pain, left Patient reports left ear pain for about 2 weeks. Pain is sharp and constant inside the left ear. Left ear does not feel full and no swelling, rash or tenderness. Pain aggravated with chewing. No tenderness along TMJ. Endorses headache, nasal congestion and sneezing. Denies fever, chills, n/v, cough, sore throat or tinnitus. No recent sick contacts. No drainage but has noticed some earwax coming out. Tried Advil sinus with minimal relief. He rates pain as 5/10. When patient tugged on left ear lobe, some pain felt in inner ear. No pain behind the ear. Patient sounded congested with sniffling over the phone visit.  Likely viral sinusitis but considered otitis media, otitis externa or mastoiditis. Discussed continuing conservative care and starting Flonase. Has Rx for Flonase from  prior visit that he has not picked up, but will pick it up today. Return precautions given.   Plan -start Flonase -continue conservative care for now, if worsening or no improvement then return for in-person visit    Patient seen with Dr. Andris Baumann, DO Internal Medicine Resident

## 2022-10-05 NOTE — Progress Notes (Signed)
Internal Medicine Clinic Attending  Case discussed with Dr. Zheng  At the time of the visit.  We reviewed the resident's history and exam and pertinent patient test results.  I agree with the assessment, diagnosis, and plan of care documented in the resident's note.  

## 2022-10-15 ENCOUNTER — Ambulatory Visit: Payer: BC Managed Care – PPO | Admitting: Student

## 2022-10-15 ENCOUNTER — Encounter: Payer: Self-pay | Admitting: Student

## 2022-10-15 VITALS — BP 127/92 | HR 97 | Temp 97.0°F | Ht 68.0 in | Wt 196.0 lb

## 2022-10-15 DIAGNOSIS — I10 Essential (primary) hypertension: Secondary | ICD-10-CM | POA: Diagnosis not present

## 2022-10-15 DIAGNOSIS — H6690 Otitis media, unspecified, unspecified ear: Secondary | ICD-10-CM | POA: Diagnosis not present

## 2022-10-15 DIAGNOSIS — Z1211 Encounter for screening for malignant neoplasm of colon: Secondary | ICD-10-CM

## 2022-10-15 DIAGNOSIS — H9203 Otalgia, bilateral: Secondary | ICD-10-CM | POA: Diagnosis not present

## 2022-10-15 DIAGNOSIS — Z0000 Encounter for general adult medical examination without abnormal findings: Secondary | ICD-10-CM

## 2022-10-15 MED ORDER — DOXYCYCLINE HYCLATE 100 MG PO TABS: 100.0000 mg | ORAL_TABLET | Freq: Two times a day (BID) | ORAL | 0 refills | Status: AC

## 2022-10-15 NOTE — Assessment & Plan Note (Signed)
His blood pressure is within acceptable range.   -Continue amlodipine and lisinopril

## 2022-10-15 NOTE — Progress Notes (Signed)
CC: Bilateral ear pain  HPI:  Colin Alexander is a 56 y.o. with past medical history of hypertension, hyperlipidemia, HIV who presents to the clinic for his persistent ear pain.  Please see problem based charting for detail  Past Medical History:  Diagnosis Date   Anogenital herpes simplex virus (HSV) infection    HIV (human immunodeficiency virus infection) ([place])    Hypertension    Review of Systems:  per HPI  Physical Exam:  Vitals:   10/15/22 1322 10/15/22 1352  BP: (!) 140/103 (!) 127/92  Pulse: 91 97  Temp: (!) 97 F (36.1 C)   TempSrc: Oral   SpO2: 98%   Weight: 196 lb (88.9 kg)   Height: '5\' 8"'$  (1.727 m)    Physical Exam Constitutional:      General: He is not in acute distress.    Appearance: He is not ill-appearing.  HENT:     Head: Normocephalic.     Comments: No tenderness to palpation of bilateral TMJ or temporal region    Right Ear: External ear normal. There is no impacted cerumen.     Left Ear: External ear normal. There is no impacted cerumen.     Ears:     Comments: Mild erythema of bilateral inner ear lining and tympanic membrane.  No effusion, purulent discharge or TM rupture noted.    Nose: Nose normal. No congestion or rhinorrhea.     Mouth/Throat:     Mouth: Mucous membranes are moist.     Pharynx: No oropharyngeal exudate or posterior oropharyngeal erythema.  Eyes:     General:        Right eye: No discharge.        Left eye: No discharge.     Conjunctiva/sclera: Conjunctivae normal.  Neck:     Comments: No cervical lymphadenopathy. Cardiovascular:     Rate and Rhythm: Normal rate and regular rhythm.  Pulmonary:     Effort: Pulmonary effort is normal. No respiratory distress.     Breath sounds: Normal breath sounds.  Musculoskeletal:     Cervical back: Normal range of motion.  Skin:    General: Skin is warm.  Neurological:     General: No focal deficit present.     Mental Status: He is alert.  Psychiatric:        Mood and  Affect: Mood normal.      Assessment & Plan:   See Encounters Tab for problem based charting.  Ear pain, bilateral Patient had a telehealth visit on 11/14 for his left ear pain and was advised to use Flonase.  States that his ear pain did not improve but now happens on the right side as well.  States that swallowing or blowing his nose triggers the pain.  Pain is localized deep in the inner ear and radiate down to his neck.  Described pain as sharp.  He reports occasional migraine headache which is relieved with Excedrin.  He also report occasional dizziness with bending forward and denies vertigo.  Patient denies fever, chills, hearing change, tinnitus, discharge from his ears, temporal headache, allergy symptoms, URI symptoms, sinus issue, or trauma.  Patient reports using Q-tips to clean his ear occasionally.  Physical exam showed mild erythema bilateral in the ear lining and tympanic membrane.  There was no effusion, purulent discharge or TM rupture.  Other ENT exam were unremarkable.  Will treat patient for possible otitis media.  Since he has severe allergic reaction to penicillin, will treat with  doxycycline for 7 days.  Advised patient to call the clinic back in a week if his symptoms does not improve, will need ENT referral then.  HTN (hypertension) His blood pressure is within acceptable range.   -Continue amlodipine and lisinopril   Healthcare maintenance -Referral to GI for colonoscopy   Patient discussed with Dr. Dareen Piano

## 2022-10-15 NOTE — Assessment & Plan Note (Signed)
-  Referral to GI for colonoscopy

## 2022-10-15 NOTE — Patient Instructions (Addendum)
Mr. Pisarski,  It was a pleasure seeing you in the clinic today.  The lining of your ears and your eardrum appear to be inflamed.  We will try you on a course of doxycycline for 7 days to treat the infection.  Please let us know if you have symptoms persist.  I will place a referral to ENT if that is the case.  I also placed a referral for your colonoscopy.  Take care  Dr. Alfonse Spruce

## 2022-10-15 NOTE — Assessment & Plan Note (Addendum)
Patient had a telehealth visit on 11/14 for his left ear pain and was advised to use Flonase.  States that his ear pain did not improve but now happens on the right side as well.  States that swallowing or blowing his nose triggers the pain.  Pain is localized deep in the inner ear and radiate down to his neck.  Described pain as sharp.  He reports occasional migraine headache which is relieved with Excedrin.  He also report occasional dizziness with bending forward and denies vertigo.  Patient denies fever, chills, hearing change, tinnitus, discharge from his ears, temporal headache, allergy symptoms, URI symptoms, sinus issue, or trauma.  Patient reports using Q-tips to clean his ear occasionally.  Physical exam showed mild erythema bilateral in the ear lining and tympanic membrane.  There was no effusion, purulent discharge or TM rupture.  Other ENT exam were unremarkable.  Will treat patient for possible otitis media.  Since he has severe allergic reaction to penicillin, will treat with doxycycline for 7 days.  Advised patient to call the clinic back in a week if his symptoms does not improve, will need ENT referral then.

## 2022-10-16 NOTE — Progress Notes (Signed)
Internal Medicine Clinic Attending  Case discussed with Dr. Nguyen  At the time of the visit.  We reviewed the resident's history and exam and pertinent patient test results.  I agree with the assessment, diagnosis, and plan of care documented in the resident's note. 

## 2022-10-22 ENCOUNTER — Telehealth: Payer: Self-pay | Admitting: Internal Medicine

## 2022-10-22 NOTE — Telephone Encounter (Signed)
Informed patient Dr.Snider sent in pitavastatin at appointment on 10/24. PA was approved. Patient aware to contact pharmacy to pick medication up.   Ester, CMA

## 2022-10-22 NOTE — Telephone Encounter (Signed)
I called pt to reschedule appt. Upon completion, pt asked to send Dr. Baxter Flattery a message. He was wondering if he qualified for the cholesterol medication she suggested. He did not have any additional information to provide regarding the medication. He can be reached with phone number on file.

## 2022-11-12 ENCOUNTER — Ambulatory Visit: Payer: BC Managed Care – PPO | Admitting: Internal Medicine

## 2022-11-12 ENCOUNTER — Ambulatory Visit (HOSPITAL_COMMUNITY)
Admission: RE | Admit: 2022-11-12 | Discharge: 2022-11-12 | Disposition: A | Discharge status: Home / Self Care | Payer: BC Managed Care – PPO | Source: Ambulatory Visit | Attending: Student in an Organized Health Care Education/Training Program | Admitting: Student in an Organized Health Care Education/Training Program

## 2022-11-12 ENCOUNTER — Other Ambulatory Visit: Payer: Self-pay

## 2022-11-12 ENCOUNTER — Encounter: Payer: Self-pay | Admitting: Internal Medicine

## 2022-11-12 VITALS — BP 135/100 | HR 96 | Temp 98.2°F | Resp 28 | Ht 68.0 in | Wt 194.1 lb

## 2022-11-12 DIAGNOSIS — R6889 Other general symptoms and signs: Secondary | ICD-10-CM

## 2022-11-12 HISTORY — DX: Other general symptoms and signs: R68.89

## 2022-11-12 NOTE — Assessment & Plan Note (Addendum)
Pt with 4 days of flu like symptoms. Ddx include viral LRI vs Bacterial LRI vs viral or bacterial PNA. Will get nasal swab to rule out common viral etiologies and chest x ray to rule out PNA given rhonchi on exam.  -Continue supportive care -Covid/flu/RSV nasal swab -DG Chest   *Patient's nasal swab came back for positive for flu A. CXR was negative for pneumonia. Pt notified of the result and recommended to continue supportive care. No need for tamiflu given pt outside of the treatment window. Return precautions given.

## 2022-11-12 NOTE — Progress Notes (Signed)
   CC: flu like symptoms  HPI:  [name] is a 57 y.o. with medical history of HIV, HTN, HLD presenting to [facility] for an acute concern of flu like symptoms.   Please see problem-based list for further details, assessments, and plans.  Past Medical History:  Diagnosis Date   Anogenital herpes simplex virus (HSV) infection    HIV (human immunodeficiency virus infection) (HCC)    Hypertension      Current Outpatient Medications (Cardiovascular):    amLODipine (NORVASC) 10 MG tablet, TAKE 1 TABLET(10 MG) BY MOUTH DAILY   lisinopril (ZESTRIL) 20 MG tablet, TAKE 1 TABLET(20 MG) BY MOUTH DAILY   Pitavastatin Calcium 4 MG TABS, Take 1 tablet (4 mg total) by mouth daily.  Current Outpatient Medications (Respiratory):    fluticasone (FLONASE) 50 MCG/ACT nasal spray, Place 1 spray into both nostrils daily.   sodium chloride (OCEAN) 0.65 % SOLN nasal spray, Place 1 spray into both nostrils as needed for congestion.    Current Outpatient Medications (Other):    BIKTARVY 50-200-25 MG TABS tablet, TAKE 1 TABLET BY MOUTH DAILY.   valACYclovir (VALTREX) 1000 MG tablet, Take 1 tab daily by mouth  Review of Systems:  Review of system negative unless stated in the problem list or HPI.    Physical Exam:  Vitals:   11/12/22 1101 11/12/22 1111  BP: (!) 142/92 (!) 135/100  Pulse: 97 96  Resp: (!) 28   Temp: 98.2 F (36.8 C)   TempSrc: Oral   SpO2: 97%   Weight: 194 lb 1.6 oz (88 kg)   Height: '5\' 8"'$  (1.727 m)     Physical Exam General: NAD HENT: NCAT, no erythema of visualized oropharynx, no TTP of sinuses Lungs: rhonchi present at bilateral bases, no wheeze or rales present.  Cardiovascular: Normal heart sounds, no r/m/g, 2+ pulses in all extremities. No LE edema Abdomen: No TTP, normal bowel sounds MSK: No asymmetry or muscle atrophy.  Skin: no lesions noted on exposed skin Neuro: Alert and oriented x4. CN grossly intact Psych: Normal mood and normal affect   Assessment &  Plan:   Cough on New year's eve. Worsened to include green phlgem, dizziness, chills. No termometer but feels like he has a fever. Taking coughing medicine.  No sore throat, has headaches, no sinus pains, has myalgias, coughing not worsening but not getting better, before dark green then light green. Had flu vaccine here. Had covid vaccine with booster, no RSV vaccine. Having intermittant ear pain. Everyone at pt's gathering in [place] had flu like symptoms.   Flu-like symptoms Pt with 4 days of flu like symptoms. Ddx include viral LRI vs Bacterial LRI vs viral or bacterial PNA. Will get nasal swab to rule out common viral etiologies and chest x ray to rule out PNA given rhonchi on exam.  -Continue supportive care -Covid/flu/RSV nasal swab -DG Chest   *Patient's nasal swab came back for positive for flu A. CXR was negative for pneumonia. Pt notified of the result and recommended to continue supportive care. No need for tamiflu given pt outside of the treatment window. Return precautions given.    See Encounters Tab for problem based charting.  Patient discussed with Dr. Edrick Kins, MD Tillie Rung. [facility] Internal Medicine Residency, PGY-2

## 2022-11-12 NOTE — Patient Instructions (Addendum)
Mr.Mccrae D Lefever, it was a pleasure seeing you today! You endorsed feeling well today. Below are some of the things we talked about this visit. We look forward to seeing you in the follow up appointment!  Today we discussed: You have flu like symptoms. We will check you for this and get a CXR. Continue supportive measures and we will call you with the results for this.   I have ordered the following labs today:   Lab Orders         COVID-19, Flu A+B and RSV       Referrals ordered today:   Referral Orders  No referral(s) requested today     I have ordered the following medication/changed the following medications:   Stop the following medications: There are no discontinued medications.   Start the following medications: No orders of the defined types were placed in this encounter.    Follow-up: 1 week follow up if symptoms persists.   Please make sure to arrive 15 minutes prior to your next appointment. If you arrive late, you may be asked to reschedule.   We look forward to seeing you next time. Please call our clinic at [phone] if you have any questions or concerns. The best time to call is Monday-Friday from 9am-4pm, but there is someone available 24/7. If after hours or the weekend, call the main hospital number and ask for the Internal Medicine Resident On-Call. If you need medication refills, please notify your pharmacy one week in advance and they will send [place] a request.  Thank you for letting us take part in your care. Wishing you the best!  Thank you, Idamae Schuller, MD

## 2022-11-13 ENCOUNTER — Telehealth: Payer: Self-pay

## 2022-11-13 LAB — COVID-19, FLU A+B AND RSV
Influenza A, NAA: DETECTED — AB
Influenza B, NAA: NOT DETECTED
RSV, NAA: NOT DETECTED
SARS-CoV-2, NAA: NOT DETECTED

## 2022-11-13 NOTE — Telephone Encounter (Signed)
Requesting x-ray result, please call pt back.  

## 2022-11-14 NOTE — Progress Notes (Signed)
Internal Medicine Clinic Attending  Case discussed with Dr. Khan  At the time of the visit.  We reviewed the resident's history and exam and pertinent patient test results.  I agree with the assessment, diagnosis, and plan of care documented in the resident's note.  

## 2022-12-04 ENCOUNTER — Ambulatory Visit: Payer: BC Managed Care – PPO | Admitting: Internal Medicine

## 2022-12-11 ENCOUNTER — Ambulatory Visit: Payer: BC Managed Care – PPO | Admitting: Internal Medicine

## 2022-12-16 ENCOUNTER — Encounter: Payer: Self-pay | Admitting: Gastroenterology

## 2022-12-22 ENCOUNTER — Other Ambulatory Visit: Payer: Self-pay

## 2022-12-22 ENCOUNTER — Telehealth (INDEPENDENT_AMBULATORY_CARE_PROVIDER_SITE_OTHER): Payer: BC Managed Care – PPO | Admitting: Internal Medicine

## 2022-12-22 ENCOUNTER — Encounter: Payer: Self-pay | Admitting: Internal Medicine

## 2022-12-22 ENCOUNTER — Other Ambulatory Visit: Payer: BC Managed Care – PPO

## 2022-12-22 ENCOUNTER — Other Ambulatory Visit (HOSPITAL_COMMUNITY)
Admission: RE | Admit: 2022-12-22 | Discharge: 2022-12-22 | Disposition: A | Discharge status: Home / Self Care | Payer: BC Managed Care – PPO | Source: Ambulatory Visit | Attending: Internal Medicine | Admitting: Internal Medicine

## 2022-12-22 DIAGNOSIS — Z79899 Other long term (current) drug therapy: Secondary | ICD-10-CM

## 2022-12-22 DIAGNOSIS — B20 Human immunodeficiency virus [HIV] disease: Secondary | ICD-10-CM | POA: Insufficient documentation

## 2022-12-22 DIAGNOSIS — Z0000 Encounter for general adult medical examination without abnormal findings: Secondary | ICD-10-CM

## 2022-12-22 DIAGNOSIS — Z113 Encounter for screening for infections with a predominantly sexual mode of transmission: Secondary | ICD-10-CM

## 2022-12-22 MED ORDER — ROSUVASTATIN CALCIUM 5 MG PO TABS: 5.0000 mg | ORAL_TABLET | Freq: Every day | ORAL | 11 refills | Status: DC

## 2022-12-22 NOTE — Progress Notes (Signed)
Virtual Visit via Video Note  I connected with Colin Alexander on 12/22/22 at 11:30 AM EST by a video enabled telemedicine application and verified that I am speaking with the correct person using two identifiers.  Location: Patient: at home Provider: in clinic   I discussed the limitations of evaluation and management by telemedicine and the availability of in person appointments. The patient expressed understanding and agreed to proceed.  History of Present Illness:  Has excellent adherence, he reports that  he had flu in mid January. Did well overall and recovered quickly   Observations/Objective: Appears at this baseline Gen = a x o by 3 in nad HEENT = PERRLA EOMI no scleral icterus.  Assessment and Plan: HIV disease = well controlled. Continue on biktarvy  Long term medication management = cr stable on last labs  Health maintenance = Start on crestor after hearing reprieve trial results.  Follow Up Instructions: See back in 3 months.    I discussed the assessment and treatment plan with the patient. The patient was provided an opportunity to ask questions and all were answered. The patient agreed with the plan and demonstrated an understanding of the instructions.   The patient was advised to call back or seek an in-person evaluation if the symptoms worsen or if the condition fails to improve as anticipated.  I provided 20 minutes of non-face-to-face time during this encounter.   Carlyle Basques, MD

## 2022-12-23 LAB — URINE CYTOLOGY ANCILLARY ONLY
Chlamydia: NEGATIVE
Comment: NEGATIVE
Comment: NORMAL
Neisseria Gonorrhea: NEGATIVE

## 2022-12-23 LAB — T-HELPER CELL (CD4) - (RCID CLINIC ONLY)
CD4 % Helper T Cell: 29 % — ABNORMAL LOW (ref 33–65)
CD4 T Cell Abs: 415 /uL (ref 400–1790)

## 2022-12-24 LAB — COMPLETE METABOLIC PANEL WITH GFR
AG Ratio: 1.4 (calc) (ref 1.0–2.5)
ALT: 18 U/L (ref 9–46)
AST: 15 U/L (ref 10–35)
Albumin: 4.4 g/dL (ref 3.6–5.1)
Alkaline phosphatase (APISO): 42 U/L (ref 35–144)
BUN: 10 mg/dL (ref 7–25)
CO2: 28 mmol/L (ref 20–32)
Calcium: 9.8 mg/dL (ref 8.6–10.3)
Chloride: 104 mmol/L (ref 98–110)
Creat: 0.92 mg/dL (ref 0.70–1.30)
Globulin: 3.2 g/dL (calc) (ref 1.9–3.7)
Glucose, Bld: 60 mg/dL — ABNORMAL LOW (ref 65–99)
Potassium: 3.5 mmol/L (ref 3.5–5.3)
Sodium: 141 mmol/L (ref 135–146)
Total Bilirubin: 0.7 mg/dL (ref 0.2–1.2)
Total Protein: 7.6 g/dL (ref 6.1–8.1)
eGFR: 96 mL/min/{1.73_m2} (ref >=60)

## 2022-12-24 LAB — CBC WITH DIFFERENTIAL/PLATELET
Absolute Monocytes: 546 cells/uL (ref 200–950)
Basophils Absolute: 81 cells/uL (ref 0–200)
Basophils Relative: 1.3 %
Eosinophils Absolute: 198 cells/uL (ref 15–500)
Eosinophils Relative: 3.2 %
HCT: 42.5 % (ref 38.5–50.0)
Hemoglobin: 15.1 g/dL (ref 13.2–17.1)
Lymphs Abs: 1736 cells/uL (ref 850–3900)
MCH: 34.2 pg — ABNORMAL HIGH (ref 27.0–33.0)
MCHC: 35.5 g/dL (ref 32.0–36.0)
MCV: 96.4 fL (ref 80.0–100.0)
MPV: 10.8 fL (ref 7.5–12.5)
Monocytes Relative: 8.8 %
Neutro Abs: 3639 cells/uL (ref 1500–7800)
Neutrophils Relative %: 58.7 %
Platelets: 280 10*3/uL (ref 140–400)
RBC: 4.41 10*6/uL (ref 4.20–5.80)
RDW: 11.9 % (ref 11.0–15.0)
Total Lymphocyte: 28 %
WBC: 6.2 10*3/uL (ref 3.8–10.8)

## 2022-12-24 LAB — HIV-1 RNA QUANT-NO REFLEX-BLD
HIV 1 RNA Quant: NOT DETECTED Copies/mL
HIV-1 RNA Quant, Log: NOT DETECTED Log cps/mL

## 2022-12-25 ENCOUNTER — Ambulatory Visit (AMBULATORY_SURGERY_CENTER): Payer: BC Managed Care – PPO

## 2022-12-25 VITALS — Ht 68.0 in | Wt 194.0 lb

## 2022-12-25 DIAGNOSIS — Z1211 Encounter for screening for malignant neoplasm of colon: Secondary | ICD-10-CM

## 2022-12-25 MED ORDER — NA SULFATE-K SULFATE-MG SULF 17.5-3.13-1.6 GM/177ML PO SOLN: 1.0000 | Freq: Once | ORAL | 0 refills | Status: AC

## 2022-12-25 NOTE — Progress Notes (Signed)

## 2022-12-30 ENCOUNTER — Encounter: Payer: Self-pay | Admitting: Gastroenterology

## 2023-01-07 ENCOUNTER — Telehealth: Payer: Self-pay

## 2023-01-07 NOTE — Telephone Encounter (Signed)
Patient called she is requesting a excused work note for her last visit on 11/12/22. Patient is requesting the note to be faxed to '@[phone]

## 2023-01-08 ENCOUNTER — Other Ambulatory Visit: Payer: Self-pay | Admitting: Internal Medicine

## 2023-01-08 NOTE — Telephone Encounter (Signed)
Letter has been faxed to patients employer 01/08/23.

## 2023-01-12 ENCOUNTER — Telehealth: Payer: Self-pay | Admitting: Gastroenterology

## 2023-01-12 NOTE — Telephone Encounter (Signed)
Called patient back, he had questions about his prep and when to start. I went over times and prep instructions. Also explained what clear liquids entailed and when to stop all liquids on the day of his procedure. After going through the times and process he understood and had not further questions.

## 2023-01-12 NOTE — Telephone Encounter (Signed)
Inbound call from patient stating that he Is scheduled for a colonoscopy for tomorrow 3/5 at 1:30 and is requesting a call back to discuss questions he has about his prep medication and instructions. Please advise.

## 2023-01-13 ENCOUNTER — Ambulatory Visit (AMBULATORY_SURGERY_CENTER): Payer: BC Managed Care – PPO | Admitting: Gastroenterology

## 2023-01-13 ENCOUNTER — Encounter: Payer: Self-pay | Admitting: Gastroenterology

## 2023-01-13 VITALS — BP 120/83 | HR 73 | Temp 98.6°F | Resp 10 | Ht 68.0 in | Wt 194.0 lb

## 2023-01-13 DIAGNOSIS — D123 Benign neoplasm of transverse colon: Secondary | ICD-10-CM

## 2023-01-13 DIAGNOSIS — Z1211 Encounter for screening for malignant neoplasm of colon: Secondary | ICD-10-CM | POA: Diagnosis present

## 2023-01-13 MED ORDER — SODIUM CHLORIDE 0.9 % IV SOLN: 500.0000 mL | INTRAVENOUS | Status: AC

## 2023-01-13 NOTE — Progress Notes (Signed)
[facility] History and Physical   Primary Care Physician:  Virl Axe, MD   Reason for Procedure:  Colorectal cancer screening  Plan:    Screening colonoscopy with possible interventions as needed     HPI: Colin Alexander is a very pleasant 57 y.o. male here for screening colonoscopy. Denies any nausea, vomiting, abdominal pain, melena or bright red blood per rectum  The risks and benefits as well as alternatives of endoscopic procedure(s) have been discussed and reviewed. All questions answered. The patient agrees to proceed.    Past Medical History:  Diagnosis Date   Anogenital herpes simplex virus (HSV) infection    HIV (human immunodeficiency virus infection) ([place])    Hypertension     Past Surgical History:  Procedure Laterality Date   CIRCUMCISION     HERNIA REPAIR      Prior to Admission medications   Medication Sig Start Date End Date Taking? Authorizing Provider  amLODipine (NORVASC) 10 MG tablet TAKE 1 TABLET(10 MG) BY MOUTH DAILY 05/08/22  Yes Virl Axe, MD  BIKTARVY 50-200-25 MG TABS tablet TAKE 1 TABLET BY MOUTH DAILY. 09/09/22  Yes Carlyle Basques, MD  lisinopril (ZESTRIL) 20 MG tablet TAKE 1 TABLET(20 MG) BY MOUTH DAILY 05/08/22  Yes Virl Axe, MD  rosuvastatin (CRESTOR) 5 MG tablet Take 1 tablet (5 mg total) by mouth daily. 12/22/22  Yes Carlyle Basques, MD  valACYclovir (VALTREX) 1000 MG tablet Take 1 tab daily by mouth 09/02/22  Yes Carlyle Basques, MD  Aspirin-Acetaminophen-Caffeine (GOODYS EXTRA STRENGTH PO) Take 1 tablet by mouth as needed (Migraines).    [provider]  fluticasone (FLONASE) 50 MCG/ACT nasal spray Place 1 spray into both nostrils daily. 07/21/22 07/21/23  Masters, Katie, DO  sodium chloride (OCEAN) 0.65 % SOLN nasal spray Place 1 spray into both nostrils as needed for congestion. Patient not taking: Reported on 12/22/2022 07/21/22   Masters, Joellen Jersey, DO    Current Outpatient Medications  Medication Sig  Dispense Refill   amLODipine (NORVASC) 10 MG tablet TAKE 1 TABLET(10 MG) BY MOUTH DAILY 90 tablet 3   BIKTARVY 50-200-25 MG TABS tablet TAKE 1 TABLET BY MOUTH DAILY. 30 tablet 11   lisinopril (ZESTRIL) 20 MG tablet TAKE 1 TABLET(20 MG) BY MOUTH DAILY 90 tablet 3   rosuvastatin (CRESTOR) 5 MG tablet Take 1 tablet (5 mg total) by mouth daily. 30 tablet 11   valACYclovir (VALTREX) 1000 MG tablet Take 1 tab daily by mouth 30 tablet 5   Aspirin-Acetaminophen-Caffeine (GOODYS EXTRA STRENGTH PO) Take 1 tablet by mouth as needed (Migraines).     fluticasone (FLONASE) 50 MCG/ACT nasal spray Place 1 spray into both nostrils daily. 9.9 mL 2   sodium chloride (OCEAN) 0.65 % SOLN nasal spray Place 1 spray into both nostrils as needed for congestion. (Patient not taking: Reported on 12/22/2022) 30 mL 0   Current Facility-Administered Medications  Medication Dose Route Frequency Provider Last Rate Last Admin   0.9 %  sodium chloride infusion  500 mL Intravenous Continuous Jerard Bays, Venia Minks, MD        Allergies as of 01/13/2023 - Review Complete 01/13/2023  Allergen Reaction Noted   Penicillins Shortness Of Breath and Rash 12/31/2011   Raspberry Swelling 03/09/2014    Family History  Problem Relation Age of Onset   Heart attack Mother        Stent placement   Hypertension Mother    Colon polyps Father    Heart attack Father  CABG   Hypertension Father    Heart attack Cousin        In his 22's deceased.   Colon cancer Neg Hx    Esophageal cancer Neg Hx    Rectal cancer Neg Hx    Stomach cancer Neg Hx     Social History   Socioeconomic History   Marital status: Divorced    Spouse name: Not on file   Number of children: Not on file   Years of education: Not on file   Highest education level: Not on file  Occupational History   Not on file  Tobacco Use   Smoking status: Never   Smokeless tobacco: Never  Vaping Use   Vaping Use: Never used  Substance and Sexual Activity    Alcohol use: No    Alcohol/week: 0.0 standard drinks of alcohol   Drug use: No   Sexual activity: Yes    Birth control/protection: Condom    Comment: declined condoms  Other Topics Concern   Not on file  Social History Narrative   Not on file   Social Determinants of Health   Financial Resource Strain: Not on file  Food Insecurity: Not on file  Transportation Needs: Not on file  Physical Activity: Not on file  Stress: Not on file  Social Connections: Not on file  Intimate Partner Violence: Not on file    Review of Systems:  All other review of systems negative except as mentioned in the HPI.  Physical Exam: Vital signs in last 24 hours: Blood Pressure (Abnormal) 132/98   Pulse 80   Temperature 98.6 F (37 C) (Temporal)   Respiration 16   Height '5\' 8"'$  (1.727 m)   Weight 194 lb (88 kg)   Oxygen Saturation 97%   Body Mass Index 29.50 kg/m  General:   Alert, NAD Lungs:  Clear .   Heart:  Regular rate and rhythm Abdomen:  Soft, nontender and nondistended. Neuro/Psych:  Alert and cooperative. Normal mood and affect. A and O x 3  Reviewed labs, radiology imaging, old records and pertinent past GI work up  Patient is appropriate for planned procedure(s) and anesthesia in an ambulatory setting   K. Denzil Magnuson , MD [phone]

## 2023-01-13 NOTE — Progress Notes (Unsigned)
Vss nad trans to pacu °

## 2023-01-13 NOTE — Op Note (Signed)
[name] Procedure Date: 01/13/2023 1:38 PM MRN: [id number] Endoscopist: Mauri Pole , MD, [id number] Age: 57 Referring MD:  Date of Birth: Jun 03, 1966 Gender: Male Account #: [id number] Procedure:                Colonoscopy Indications:              Screening for colorectal malignant neoplasm Medicines:                Monitored Anesthesia Care Procedure:                Pre-Anesthesia Assessment:                           - Prior to the procedure, a History and Physical                            was performed, and patient medications and                            allergies were reviewed. The patient's tolerance of                            previous anesthesia was also reviewed. The risks                            and benefits of the procedure and the sedation                            options and risks were discussed with the patient.                            All questions were answered, and informed consent                            was obtained. Prior Anticoagulants: The patient has                            taken no anticoagulant or antiplatelet agents. ASA                            Grade Assessment: III - A patient with severe                            systemic disease. After reviewing the risks and                            benefits, the patient was deemed in satisfactory                            condition to undergo the procedure.                           After obtaining informed consent, the colonoscope  was passed under direct vision. Throughout the                            procedure, the patient's blood pressure, pulse, and                            oxygen saturations were monitored continuously. The                            Olympus [id number]) Colonoscope was                            introduced through the anus and advanced to the the                            cecum,  identified by appendiceal orifice and                            ileocecal valve. The colonoscopy was performed                            without difficulty. The patient tolerated the                            procedure well. The quality of the bowel                            preparation was good. The ileocecal valve,                            appendiceal orifice, and rectum were photographed. Scope In: 1:46:04 PM Scope Out: 1:56:50 PM Scope Withdrawal Time: 0 hours 8 minutes 29 seconds  Total Procedure Duration: 0 hours 10 minutes 46 seconds  Findings:                 The perianal and digital rectal examinations were                            normal.                           A 5 mm polyp was found in the transverse colon. The                            polyp was sessile. The polyp was removed with a                            cold snare. Resection and retrieval were complete.                           Scattered medium-mouthed and small-mouthed                            diverticula were found in the sigmoid colon and  descending colon.                           Non-bleeding external and internal hemorrhoids were                            found during retroflexion. The hemorrhoids were                            medium-sized. Complications:            No immediate complications. Estimated Blood Loss:     Estimated blood loss was minimal. Impression:               - One 5 mm polyp in the transverse colon, removed                            with a cold snare. Resected and retrieved.                           - Diverticulosis in the sigmoid colon and in the                            descending colon.                           - Non-bleeding external and internal hemorrhoids. Recommendation:           - Patient has a contact number available for                            emergencies. The signs and symptoms of potential                            delayed  complications were discussed with the                            patient. Return to normal activities tomorrow.                            Written discharge instructions were provided to the                            patient.                           - Resume previous diet.                           - Continue present medications.                           - Await pathology results.                           - Repeat colonoscopy in 5-10 years for surveillance  based on pathology results. Mauri Pole, MD 01/13/2023 2:08:43 PM This report has been signed electronically.

## 2023-01-13 NOTE — Progress Notes (Unsigned)
Called to room to assist during endoscopic procedure.  Patient ID and intended procedure confirmed with present staff. Received instructions for my participation in the procedure from the performing physician.  

## 2023-01-13 NOTE — Patient Instructions (Signed)
Discharge instructions given. Handouts on polyps,diverticulosis and Hemorrhoids. Resume previous medications. YOU HAD AN ENDOSCOPIC PROCEDURE TODAY AT [facility]:   Refer to the procedure report that was given to you for any specific questions about what was found during the examination.  If the procedure report does not answer your questions, please call your gastroenterologist to clarify.  If you requested that your care partner not be given the details of your procedure findings, then the procedure report has been included in a sealed envelope for you to review at your convenience later.  YOU SHOULD EXPECT: Some feelings of bloating in the abdomen. Passage of more gas than usual.  Walking can help get rid of the air that was put into your GI tract during the procedure and reduce the bloating. If you had a lower endoscopy (such as a colonoscopy or flexible sigmoidoscopy) you may notice spotting of blood in your stool or on the toilet paper. If you underwent a bowel prep for your procedure, you may not have a normal bowel movement for a few days.  Please Note:  You might notice some irritation and congestion in your nose or some drainage.  This is from the oxygen used during your procedure.  There is no need for concern and it should clear up in a day or so.  SYMPTOMS TO REPORT IMMEDIATELY:  Following lower endoscopy (colonoscopy or flexible sigmoidoscopy):  Excessive amounts of blood in the stool  Significant tenderness or worsening of abdominal pains  Swelling of the abdomen that is new, acute  Fever of 100F or higher   For urgent or emergent issues, a gastroenterologist can be reached at any hour by calling [phone]. Do not use MyChart messaging for urgent concerns.    DIET:  We do recommend a small meal at first, but then you may proceed to your regular diet.  Drink plenty of fluids but you should avoid alcoholic beverages for 24 hours.  ACTIVITY:  You should  plan to take it easy for the rest of today and you should NOT DRIVE or use heavy machinery until tomorrow (because of the sedation medicines used during the test).    FOLLOW UP: Our staff will call the number listed on your records the next business day following your procedure.  We will call around 7:15- 8:00 am to check on you and address any questions or concerns that you may have regarding the information given to you following your procedure. If we do not reach you, we will leave a message.     If any biopsies were taken you will be contacted by phone or by letter within the next 1-3 weeks.  Please call us at [phone] if you have not heard about the biopsies in 3 weeks.    SIGNATURES/CONFIDENTIALITY: You and/or your care partner have signed paperwork which will be entered into your electronic medical record.  These signatures attest to the fact that that the information above on your After Visit Summary has been reviewed and is understood.  Full responsibility of the confidentiality of this discharge information lies with you and/or your care-partner.

## 2023-01-13 NOTE — Progress Notes (Signed)
Pt's states no medical or surgical changes since previsit or office visit. 

## 2023-01-14 ENCOUNTER — Telehealth: Payer: Self-pay

## 2023-01-14 NOTE — Telephone Encounter (Signed)
  Follow up Call-     01/13/2023    1:09 PM  Call back number  Post procedure Call Back phone  # [phone]  Permission to leave phone message Yes     Patient questions:  Do you have a fever, pain , or abdominal swelling? No. Pain Score  0 *  Have you tolerated food without any problems? Yes.    Have you been able to return to your normal activities? Yes.    Do you have any questions about your discharge instructions: Diet   No. Medications  No. Follow up visit  No.  Do you have questions or concerns about your Care? No.  Actions: * If pain score is 4 or above: No action needed, pain <4.

## 2023-01-19 NOTE — Telephone Encounter (Signed)
Work note

## 2023-01-22 ENCOUNTER — Encounter: Payer: Self-pay | Admitting: Gastroenterology

## 2023-02-24 ENCOUNTER — Encounter: Payer: Self-pay | Admitting: Student

## 2023-02-24 ENCOUNTER — Ambulatory Visit: Payer: BC Managed Care – PPO | Admitting: Student

## 2023-02-24 ENCOUNTER — Other Ambulatory Visit: Payer: Self-pay

## 2023-02-24 VITALS — BP 125/92 | HR 91 | Temp 98.3°F | Ht 68.0 in | Wt 196.0 lb

## 2023-02-24 DIAGNOSIS — S8991XA Unspecified injury of right lower leg, initial encounter: Secondary | ICD-10-CM | POA: Diagnosis not present

## 2023-02-24 NOTE — Assessment & Plan Note (Signed)
BP slightly elevated at 125/92 today. Patient did not take his antihypertensives this morning.

## 2023-02-24 NOTE — Progress Notes (Signed)
Subjective:   [name] male   DOB: October 30, 1966 57 y.o.   MRN: [id number]  HPI: [name] is a 57 y.o. male with a Pmhx as listed below who presents for evaluation of right knee pain following traumatic injury. Please see problem based assessment and plan for further work up and management.   Patient Active Problem List   Diagnosis Date Noted   Right knee injury, initial encounter 02/24/2023   Rhinosinusitis 07/22/2022   Chronic left hip pain 06/11/2022   Healthcare maintenance 12/13/2021   Herpes genitalis    Family history of premature CAD 10/18/2012   HSV (herpes simplex virus) anogenital infection 06/07/2012   HIV (human immunodeficiency virus infection) 01/13/2012   HTN (hypertension) 12/31/2011     Current Outpatient Medications  Medication Sig Dispense Refill   amLODipine (NORVASC) 10 MG tablet TAKE 1 TABLET(10 MG) BY MOUTH DAILY 90 tablet 3   Aspirin-Acetaminophen-Caffeine (GOODYS EXTRA STRENGTH PO) Take 1 tablet by mouth as needed (Migraines).     BIKTARVY 50-200-25 MG TABS tablet TAKE 1 TABLET BY MOUTH DAILY. 30 tablet 11   fluticasone (FLONASE) 50 MCG/ACT nasal spray Place 1 spray into both nostrils daily. 9.9 mL 2   lisinopril (ZESTRIL) 20 MG tablet TAKE 1 TABLET(20 MG) BY MOUTH DAILY 90 tablet 3   rosuvastatin (CRESTOR) 5 MG tablet Take 1 tablet (5 mg total) by mouth daily. 30 tablet 11   valACYclovir (VALTREX) 1000 MG tablet Take 1 tab daily by mouth 30 tablet 5   Current Facility-Administered Medications  Medication Dose Route Frequency Provider Last Rate Last Admin   0.9 %  sodium chloride infusion  500 mL Intravenous Continuous Nandigam, Eleonore Chiquito, MD         Review of Systems: Pertinent ROS listed in the A&P. Otherwise negative.   Objective:   Physical Exam: Vitals:   02/24/23 0954 02/24/23 1053  BP: (!) 121/95 (!) 125/92  Pulse: (!) 107 91  Temp: 98.3 F (36.8 C)   TempSrc: Oral   SpO2: 99%   Weight: 196 lb (88.9  kg)   Height:  (1.727 m)    Physical Exam Constitutional:      Appearance: Normal appearance.  HENT:     Head: Normocephalic and atraumatic.     Mouth/Throat:     Mouth: Mucous membranes are moist.  Pulmonary:     Effort: Pulmonary effort is normal.  Musculoskeletal:     Comments: Right knee: No overlying effusion, erythema, or warmth. Mild pain elicited just medial to the patella with palpation over the center of the patella. Pain elicited with extension/flexion of the leg, dorsi/planter flexion of the foot, and upon standing and walking. Negative anterior/posterior drawer test. No laxity with valgus/varus stress. There is pain over the patella with inversion/eversion of the foot. No bony crepitus with ROM of the knee. 5/5 strength of the right leg. Patient ambulates with a limp.  Skin:    General: Skin is warm and dry.  Neurological:     General: No focal deficit present.     Mental Status: He is alert and oriented to person, place, and time.  Psychiatric:        Mood and Affect: Mood normal.        Behavior: Behavior normal.      Assessment & Plan:   Right knee injury, initial encounter Pt reports he was walking up the steps onto a shuttle on 04/10 when the wind blew  the shuttle door outward causing it to strike his right patella. He experienced throbbing knee pain immediately after this. He is unsure if the knee was swollen. Later that same day he struck the right knee on a desk at school. Since then he has had throbbing pain of his right knee. Whenever he extends the right leg, dorsi/plantar flexes the right foot, stands from sitting or bears weight on the right knee he experiences worsening pain. He described this pain as a tingling sensation overlying the right patella with a pulling sensation on the lateral and medial aspect of the right knee that extends into his lower right quads. Pain has remained largely unchanged over the last week. He has tried ice and leg elevation  with some relief.  Also tried ibuprofen, but is unsure is this helped. He has been able to bear weight, but walks with a limp. Denies any popping, locking, or catching of the knee. Exam reveals no overlying swelling or erythema. There is some tenderness overlying the patella and with ROM. Negative anterior/posterior drawer test and no laxity with valgus or varus stress to suggest ligament or meniscal tear. Low suspicion for fracture as patient is able to bear weight and has no significant effusion. History and presentation seem more in line with knee sprain. Given prolonged pain, will still obtain knee x ray for fracture rule out. Additionally, pt does meet criteria  according to [place] rules. In the meantime, will recommend conservative measure and follow up. If pain continues, may consider MRI.   > Alternate Tylenol and ibuprofen > Warm compress >  Stretching exercises > Knee x ray  HTN (hypertension) BP slightly elevated at 125/92 today. Patient did not take his antihypertensives this morning.

## 2023-02-24 NOTE — Patient Instructions (Signed)
Colin Alexander,  It was a pleasure seeing you in the clinic today.   For your right knee injury, I have ordered knee x-rays to better evaluate the area. They will call you to schedule this. In the meantime, alternate tylenol and ibuprofen as needed to help with the pain. Ice and heat will also help relieve the discomfort.  I have also provided knee exercises to do at home. Please do these as your body tolerates to prevent your knee from getting stiff. These knee injuries can take some time to heal (weeks to months) so listen to your body and try not to overwork yourself.  Please come back to see me in 2 weeks.  Please call our clinic at [phone] if you have any questions or concerns. The best time to call is Monday-Friday from 9am-4pm, but there is someone available 24/7 at the same number. If you need medication refills, please notify your pharmacy one week in advance and they will send [place] a request.   Thank you for letting us take part in your care. We look forward to seeing you next time!

## 2023-02-24 NOTE — Assessment & Plan Note (Signed)
Pt reports he was walking up the steps onto a shuttle on 04/10 when the wind blew the shuttle door outward causing it to strike his right patella. He experienced throbbing knee pain immediately after this. He is unsure if the knee was swollen. Later that same day he struck the right knee on a desk at school. Since then he has had throbbing pain of his right knee. Whenever he extends the right leg, dorsi/plantar flexes the right foot, stands from sitting or bears weight on the right knee he experiences worsening pain. He described this pain as a tingling sensation overlying the right patella with a pulling sensation on the lateral and medial aspect of the right knee that extends into his lower right quads. Pain has remained largely unchanged over the last week. He has tried ice and leg elevation with some relief.  Also tried ibuprofen, but is unsure is this helped. He has been able to bear weight, but walks with a limp. Denies any popping, locking, or catching of the knee. Exam reveals no overlying swelling or erythema. There is some tenderness overlying the patella and with ROM. Negative anterior/posterior drawer test and no laxity with valgus or varus stress to suggest ligament or meniscal tear. Low suspicion for fracture as patient is able to bear weight and has no significant effusion. History and presentation seem more in line with knee sprain. Given prolonged pain, will still obtain knee x ray for fracture rule out. Additionally, pt does meet criteria  according to [place] rules. In the meantime, will recommend conservative measure and follow up. If pain continues, may consider MRI.   > Alternate Tylenol and ibuprofen > Warm compress >  Stretching exercises > Knee x ray

## 2023-02-24 NOTE — Progress Notes (Signed)
Attestation for Student Documentation:  I personally was present and performed or re-performed the history, physical exam and medical decision-making activities of this service and have verified that the service and findings are accurately documented in the student's note.  Merrilyn Puma, MD 02/24/2023, 1:02 PM

## 2023-02-27 ENCOUNTER — Ambulatory Visit (HOSPITAL_COMMUNITY)
Admission: RE | Admit: 2023-02-27 | Discharge: 2023-02-27 | Disposition: A | Discharge status: Home / Self Care | Payer: BC Managed Care – PPO | Source: Ambulatory Visit | Attending: Internal Medicine | Admitting: Internal Medicine

## 2023-02-27 DIAGNOSIS — S8991XA Unspecified injury of right lower leg, initial encounter: Secondary | ICD-10-CM

## 2023-03-05 ENCOUNTER — Other Ambulatory Visit: Payer: Self-pay | Admitting: Student

## 2023-03-05 DIAGNOSIS — S8991XA Unspecified injury of right lower leg, initial encounter: Secondary | ICD-10-CM

## 2023-03-10 ENCOUNTER — Ambulatory Visit (INDEPENDENT_AMBULATORY_CARE_PROVIDER_SITE_OTHER): Payer: BC Managed Care – PPO | Admitting: Student

## 2023-03-10 ENCOUNTER — Encounter: Payer: Self-pay | Admitting: Student

## 2023-03-10 VITALS — BP 145/106 | HR 87 | Temp 98.4°F | Wt 196.5 lb

## 2023-03-10 DIAGNOSIS — I10 Essential (primary) hypertension: Secondary | ICD-10-CM

## 2023-03-10 DIAGNOSIS — B20 Human immunodeficiency virus [HIV] disease: Secondary | ICD-10-CM

## 2023-03-10 DIAGNOSIS — S8991XA Unspecified injury of right lower leg, initial encounter: Secondary | ICD-10-CM | POA: Diagnosis not present

## 2023-03-10 DIAGNOSIS — M25552 Pain in left hip: Secondary | ICD-10-CM

## 2023-03-10 DIAGNOSIS — G8929 Other chronic pain: Secondary | ICD-10-CM

## 2023-03-10 NOTE — Assessment & Plan Note (Signed)
BP elevated in clinic today. However, has been stable on current regimen for a long time. Will avoid changing his medication regimen at this time and reassess BP at next visit to see if he needs adjustment of his medications.  -continue norvasc 10mg  daily and lisinopril 20mg  daily

## 2023-03-10 NOTE — Assessment & Plan Note (Signed)
Discussed need to see [facility] in 1-2 months for routine lab work. He will call Dr. Feliz Beam office to schedule this.

## 2023-03-10 NOTE — Assessment & Plan Note (Signed)
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 This issue is chronic and stable. Denies any hip pain at this time. This does not appear to be functionally limiting at this time.

## 2023-03-10 NOTE — Assessment & Plan Note (Signed)
Continues to have lingering pain similar to that described in previous note from 02/24/2023. Knee imaging was negative for acute abnormalities. This is likely a MSK injury. Referred to sports medicine and has a visit on 5/6 with Dr. Margaretha Sheffield.  Plan: -f/u with Sports Medicine

## 2023-03-10 NOTE — Patient Instructions (Addendum)
Mr. Colin Alexander,  It was a pleasure seeing you in the clinic today.   Your appointment with Sports Medicine will be on 03/16/2023 at 11:30AM.  I have provided the work letter for you as well. Please come back to see [place] in 6 months (or sooner if needed).   Please call our clinic at [phone] if you have any questions or concerns. The best time to call is Monday-Friday from 9am-4pm, but there is someone available 24/7 at the same number. If you need medication refills, please notify your pharmacy one week in advance and they will send [place] a request.   Thank you for letting us take part in your care. We look forward to seeing you next time!

## 2023-03-10 NOTE — Progress Notes (Signed)
   CC: preventative healthcare  HPI:  [name] is a 57 y.o. male with history listed below presenting to the [facility] for preventative health care. Please see individualized problem based charting for full HPI.  Past Medical History:  Diagnosis Date   Anogenital herpes simplex virus (HSV) infection    Ear pain, bilateral 09/23/2022   Flu-like symptoms 11/12/2022   Cough on New year's eve. Worsened to include green phlgem, dizziness, chills. No termometer but feels like he has a fever. Taking coughing medicine.  No sore throat, has headaches, no sinus pains, has myalgias, coughing not worsening but not getting better, before dark green then light green. Had flu vaccine here. Had covid vaccine with booster, no RSV vaccine. Having intermittant ear pain. Everyo   HIV (human immunodeficiency virus infection) (HCC)    Hypertension     Review of Systems:  Negative aside from that listed in individualized problem based charting.  Physical Exam:  Vitals:   03/10/23 1008 03/10/23 1042  BP: (!) 144/98 (!) 145/106  Pulse: 90 87  Temp: 98.4 F (36.9 C)   TempSrc: Oral   SpO2: 97%   Weight: 196 lb 8 oz (89.1 kg)    Physical Exam Constitutional:      Appearance: Normal appearance. He is not ill-appearing.  Eyes:     General: No scleral icterus.    Extraocular Movements: Extraocular movements intact.     Conjunctiva/sclera: Conjunctivae normal.     Pupils: Pupils are equal, round, and reactive to light.  Cardiovascular:     Rate and Rhythm: Normal rate and regular rhythm.     Heart sounds: Normal heart sounds. No murmur heard.    No friction rub. No gallop.  Pulmonary:     Effort: Pulmonary effort is normal.     Breath sounds: Normal breath sounds. No wheezing, rhonchi or rales.  Abdominal:     General: Bowel sounds are normal. There is no distension.     Palpations: Abdomen is soft.     Tenderness: There is no abdominal tenderness.  Musculoskeletal:        General: No  swelling. Normal range of motion.     Comments: Endorses tugging sensation and mild pain with resistance to exertion and flexion of R knee. No laxity noted. No swelling or erythema overlying knee.  Skin:    General: Skin is warm and dry.  Neurological:     General: No focal deficit present.     Mental Status: He is alert and oriented to person, place, and time.  Psychiatric:        Mood and Affect: Mood normal.        Behavior: Behavior normal.      Assessment & Plan:   See Encounters Tab for problem based charting.  Patient discussed with Dr.  Lafonda Mosses

## 2023-03-11 NOTE — Progress Notes (Signed)
Internal Medicine Clinic Attending  Case discussed with Dr. Jinwala  At the time of the visit.  We reviewed the resident's history and exam and pertinent patient test results.  I agree with the assessment, diagnosis, and plan of care documented in the resident's note.  

## 2023-03-16 ENCOUNTER — Ambulatory Visit: Payer: BC Managed Care – PPO | Admitting: Sports Medicine

## 2023-03-16 VITALS — BP 134/88 | Ht 68.0 in | Wt 196.0 lb

## 2023-03-16 DIAGNOSIS — M25561 Pain in right knee: Secondary | ICD-10-CM

## 2023-03-16 MED ORDER — MELOXICAM 15 MG PO TABS: ORAL_TABLET | ORAL | 0 refills | Status: DC

## 2023-03-16 NOTE — Progress Notes (Signed)
   Subjective:    [name], male    DOB: 15-Feb-1966, 57 y.o.   MRN: [id number]  HPI chief complaint: Right knee pain  Colin Alexander is a very pleasant 57 year old male that presents today complaining of approximately 1 month of anterior right knee pain.  Pain began when he hit his knee on a shuttle door.  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  He had immediate pain but no swelling or bruising.  Since that time, he continues to experience pain with certain activities such as driving and with going up stairs.  He has self treated with ice as well as compression.  He also took over-the-counter Motrin and Tylenol with some improvement.  He denies any problems with this knee in the past.  No prior knee surgeries.  His PCP did order x-rays of the right knee which are available for my review.  Past medical history reviewed Medications reviewed Allergies reviewed    Review of Systems As above    Objective:   Physical Exam  Well-developed, well-nourished.  No acute distress  Right knee: Range of motion is 0 to 100 degrees.  No obvious effusion.  No soft tissue swelling.  No ecchymosis.  Trace patellofemoral crepitus with reproducible peripatellar pain with full extension.  Tight lateral retinaculum.  Knee is stable to valgus and varus stressing.  Negative anterior drawer, negative posterior drawer.  No tenderness to palpation along medial or lateral joint lines.  Equivocal Thessaly's.  Neurovascularly intact distally.  X-rays of the right knee including AP, lateral, and sunrise views dated March 03, 2023 are available for review.  Nothing acute is seen.  There is a paucity of degenerative changes.  No joint effusion.  There is some lateral translation of the patella in the trochlear groove.      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  We will start meloxicam  15 mg daily for 3 days then as needed.  He is cautioned about GI upset with this medication.  We will also start with physical therapy ([facility]) to help treat his chondromalacia patella/patellar maltracking.  He will follow-up with me again in 4 weeks.  If symptoms are not improving, consider merits of further diagnostic imaging at that time.  This note was dictated using Dragon naturally speaking software and may contain errors in syntax, spelling, or content which have not been identified prior to signing this note.

## 2023-04-13 ENCOUNTER — Encounter: Payer: Self-pay | Admitting: *Deleted

## 2023-04-14 ENCOUNTER — Ambulatory Visit: Payer: BC Managed Care – PPO | Admitting: Sports Medicine

## 2023-04-14 ENCOUNTER — Ambulatory Visit (INDEPENDENT_AMBULATORY_CARE_PROVIDER_SITE_OTHER): Payer: BC Managed Care – PPO | Admitting: Sports Medicine

## 2023-04-14 VITALS — BP 136/84 | Ht 68.0 in | Wt 196.0 lb

## 2023-04-14 DIAGNOSIS — M25561 Pain in right knee: Secondary | ICD-10-CM | POA: Diagnosis not present

## 2023-04-14 NOTE — Progress Notes (Signed)
  Colin Alexander - 57 y.o. male MRN [id number]  Date of birth: June 21, 1966    CHIEF COMPLAINT:   right knee pain    SUBJECTIVE:   HPI: Colin Alexander is a 56 year old male presenting for his one month follow up appointment for right knee pain. Since his last visit the patient says he has participated in physical therapy and has done physical therapy exercises for the knee while at home. He reports that his pain has subsided, and has no complaints at today's visit. He states that he is currently moving from [place] and feels like the knee has tolerated the move well. Since his last visit he has also had an X-Ray of the right knee which had no acute findings.    ROS:     See HPI  PERTINENT  PMH / PSH FH / / SH:  Past Medical, Surgical, Social, and Family History Reviewed & Updated in the EMR.  Pertinent findings include:  No pertinent history regarding the knee  OBJECTIVE: BP 136/84   Ht 5\' 8"  (1.727 m)   Wt 196 lb (88.9 kg)   BMI 29.80 kg/m   Physical Exam:  Vital signs are reviewed.  GEN: Alert and oriented, NAD Pulm: Breathing unlabored PSY: normal mood, congruent affect  MSK: Right Knee- Sensation intact throughout entire right lower extremity. No obvious effusion or soft tissue swelling. Full ROM with flexion and extension. Non tender to palpation on medial and lateral joint line. No valgus or varus instability appreciated.   ASSESSMENT & PLAN:  1. 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 It is likely that strengthening of his quadriceps muscles through physical therapy played a large role in the improvement of his pain. His X-Ray images are reassuring that there is no acute bone pathology that is contributing to his initial presentation. We recommend the patient continue physical therapy exercises and follow up as needed. If his pain were to return, we may consider further diagnostic imaging. Additionally, we will provide the  patient with a note stating that he can return to work.   Colin Alexander, MS4  Patient seen and evaluated with the medical student.  I agree with the above plan of care.  Patient is doing very well.  X-rays show only mild degenerative changes.  I will discharge him from my care and he may follow-up with me as needed.  We will give him a note allowing him to return to work without restriction.

## 2023-04-23 ENCOUNTER — Telehealth: Payer: Self-pay

## 2023-04-23 NOTE — Telephone Encounter (Signed)
Patient called he stated has been having nausea and dizziness for about 2 weeks, patient stated he felt something crawling on his calf and realized it was a tick which ended up falling off. I advised patient we don't have any available appointments until 6/24. Patient states he will not be able to come he will be on vacation. Patient is requesting a call back from Dr.Jinwala.

## 2023-04-30 ENCOUNTER — Ambulatory Visit: Payer: BC Managed Care – PPO | Admitting: Student

## 2023-04-30 ENCOUNTER — Encounter: Payer: Self-pay | Admitting: Student

## 2023-04-30 VITALS — BP 164/98 | HR 80 | Temp 98.5°F | Wt 195.3 lb

## 2023-04-30 DIAGNOSIS — R42 Dizziness and giddiness: Secondary | ICD-10-CM | POA: Diagnosis not present

## 2023-04-30 NOTE — Patient Instructions (Addendum)
Thank you so much for coming to the clinic today!   I believe your episodes are consistent with dehydration. You're also on blood pressure medication which can cause you to become even more dehydrated. I think the best thing you can do for yourself is drink water throughout the day, around 4 liters daily. I am happy to hear that you're doing well.    If you have any questions please feel free to the call the clinic at anytime at [phone]. It was a pleasure seeing you!  Best, Dr. Thomasene Ripple

## 2023-04-30 NOTE — Assessment & Plan Note (Addendum)
Patient presents for a two week history of intermittent dizziness. He states he has been in the process of moving from [place], and noticed that everytime he would stand up he would feel lightheaded. These episodes would last for about 10 minutes, and would return to baseline without any intervention.  He has also been stressed more recently, with moving as well as problems at his apartment. He denies any fainting, room spinning around him, or trauma to the head. Currently he is asymptomatic.   On my physical exam, he has normal gait, neurologic exam within normal limits, and unable to elicit symptoms via head tilt test.   I believe his symptoms were multifactorial, in the setting of dehydration as well as stress of moving. Encouraged patient to keep up fluid intake with minimum 3.7L of water a day, and he is agreeable.   Plan:  - Encourage PO intake

## 2023-05-01 NOTE — Progress Notes (Signed)
CC: Lightheadedness  HPI:  Colin Alexander is a 57 y.o. male living with a history stated below and presents today for lightheadedness. Please see problem based assessment and plan for additional details.  Past Medical History:  Diagnosis Date   Anogenital herpes simplex virus (HSV) infection    Ear pain, bilateral 09/23/2022   Flu-like symptoms 11/12/2022   Cough on New year's eve. Worsened to include green phlgem, dizziness, chills. No termometer but feels like he has a fever. Taking coughing medicine.  No sore throat, has headaches, no sinus pains, has myalgias, coughing not worsening but not getting better, before dark green then light green. Had flu vaccine here. Had covid vaccine with booster, no RSV vaccine. Having intermittant ear pain. Everyo   HIV (human immunodeficiency virus infection) (HCC)    Hypertension     Current Outpatient Medications on File Prior to Visit  Medication Sig Dispense Refill   amLODipine (NORVASC) 10 MG tablet TAKE 1 TABLET(10 MG) BY MOUTH DAILY 90 tablet 3   Aspirin-Acetaminophen-Caffeine (GOODYS EXTRA STRENGTH PO) Take 1 tablet by mouth as needed (Migraines).     BIKTARVY 50-200-25 MG TABS tablet TAKE 1 TABLET BY MOUTH DAILY. 30 tablet 11   fluticasone (FLONASE) 50 MCG/ACT nasal spray Place 1 spray into both nostrils daily. 9.9 mL 2   lisinopril (ZESTRIL) 20 MG tablet TAKE 1 TABLET(20 MG) BY MOUTH DAILY 90 tablet 3   meloxicam (MOBIC) 15 MG tablet Take one pill a day with food for 3 days and then prn thereafter 30 tablet 0   rosuvastatin (CRESTOR) 5 MG tablet Take 1 tablet (5 mg total) by mouth daily. 30 tablet 11   valACYclovir (VALTREX) 1000 MG tablet Take 1 tab daily by mouth 30 tablet 5   Current Facility-Administered Medications on File Prior to Visit  Medication Dose Route Frequency Provider Last Rate Last Admin   0.9 %  sodium chloride infusion  500 mL Intravenous Continuous Nandigam, Eleonore Chiquito, MD        Family History  Problem  Relation Age of Onset   Heart attack Mother        Stent placement   Hypertension Mother    Colon polyps Father    Heart attack Father        CABG   Hypertension Father    Heart attack Cousin        In his 16's deceased.   Colon cancer Neg Hx    Esophageal cancer Neg Hx    Rectal cancer Neg Hx    Stomach cancer Neg Hx     Social History   Socioeconomic History   Marital status: Divorced    Spouse name: Not on file   Number of children: Not on file   Years of education: Not on file   Highest education level: Not on file  Occupational History   Not on file  Tobacco Use   Smoking status: Never   Smokeless tobacco: Never  Vaping Use   Vaping Use: Never used  Substance and Sexual Activity   Alcohol use: No    Alcohol/week: 0.0 standard drinks of alcohol   Drug use: No   Sexual activity: Yes    Birth control/protection: Condom    Comment: declined condoms  Other Topics Concern   Not on file  Social History Narrative   Not on file   Social Determinants of Health   Financial Resource Strain: Low Risk  (02/24/2023)   Overall Financial Resource Strain (CARDIA)  Difficulty of Paying Living Expenses: Not hard at all  Food Insecurity: No Food Insecurity (02/24/2023)   Hunger Vital Sign    Worried About Running Out of Food in the Last Year: Never true    Ran Out of Food in the Last Year: Never true  Transportation Needs: No Transportation Needs (02/24/2023)   PRAPARE - Administrator, Civil Service (Medical): No    Lack of Transportation (Non-Medical): No  Physical Activity: Insufficiently Active (02/24/2023)   Exercise Vital Sign    Days of Exercise per Week: 2 days    Minutes of Exercise per Session: 20 min  Stress: No Stress Concern Present (02/24/2023)   [employer] of Occupational Health - Occupational Stress Questionnaire    Feeling of Stress : Not at all  Social Connections: Moderately Integrated (02/24/2023)   Social Connection and Isolation  Panel [NHANES]    Frequency of Communication with Friends and Family: More than three times a week    Frequency of Social Gatherings with Friends and Family: More than three times a week    Attends Religious Services: More than 4 times per year    Active Member of [employer] or Organizations: Yes    Attends Banker Meetings: 1 to 4 times per year    Marital Status: Divorced  Intimate Partner Violence: Not At Risk (02/24/2023)   Humiliation, Afraid, Rape, and Kick questionnaire    Fear of Current or Ex-Partner: No    Emotionally Abused: No    Physically Abused: No    Sexually Abused: No    Review of Systems: ROS negative except for what is noted on the assessment and plan.  Vitals:   04/30/23 0853  BP: (!) 164/98  Pulse: 80  Temp: 98.5 F (36.9 C)  TempSrc: Oral  SpO2: 99%  Weight: 195 lb 4.8 oz (88.6 kg)    Physical Exam: Constitutional: well-appearing male  in no acute distress HENT: normocephalic atraumatic, mucous membranes moist Eyes: conjunctiva non-erythematous Neck: supple Cardiovascular: regular rate and rhythm, no m/r/g Pulmonary/Chest: normal work of breathing on room air, lungs clear to auscultation bilaterally Neurological: alert & oriented x 3, 5/5 strength in bilateral upper and lower extremities, normal gait Skin: Decreased skin turgor  Assessment & Plan:   Lightheadedness Patient presents for a two week history of intermittent dizziness. He states he has been in the process of moving from [place], and noticed that everytime he would stand up he would feel lightheaded. These episodes would last for about 10 minutes, and would return to baseline without any intervention. He has also been stressed more recently, with moving as well as problems at his apartment. Currently he is asymptomatic.   On my physical exam, he has normal gait, neurologic exam within normal limits, and unable to elicit symptoms via head tilt test.   I believe his symptoms were  multifactorial, in the setting of dehydration as well as stress of moving. Encouraged patient to keep up fluid intake with minimum 3.7L of water a day, and he is agreeable.   Plan:  - Encourage PO intake  Patient discussed with Dr. Lanelle Bal Daisey Caloca, M.D. [facility], PGY-1 Pager: [phone] Date 05/01/2023 Time 2:41 PM

## 2023-05-05 NOTE — Progress Notes (Signed)
Internal Medicine Clinic Attending  Case discussed with Dr. Nooruddin  at the time of the visit.  We reviewed the resident's history and exam and pertinent patient test results.  I agree with the assessment, diagnosis, and plan of care documented in the resident's note.  

## 2023-05-10 ENCOUNTER — Other Ambulatory Visit: Payer: Self-pay | Admitting: Student

## 2023-05-10 DIAGNOSIS — I10 Essential (primary) hypertension: Secondary | ICD-10-CM

## 2023-05-15 ENCOUNTER — Other Ambulatory Visit: Payer: Self-pay

## 2023-05-15 DIAGNOSIS — I10 Essential (primary) hypertension: Secondary | ICD-10-CM

## 2023-06-10 ENCOUNTER — Other Ambulatory Visit: Payer: Self-pay

## 2023-06-10 ENCOUNTER — Ambulatory Visit: Payer: BC Managed Care – PPO | Admitting: Student

## 2023-06-10 ENCOUNTER — Encounter: Payer: Self-pay | Admitting: Student

## 2023-06-10 ENCOUNTER — Other Ambulatory Visit (HOSPITAL_COMMUNITY)
Admission: RE | Admit: 2023-06-10 | Discharge: 2023-06-10 | Disposition: A | Discharge status: Home / Self Care | Payer: BC Managed Care – PPO | Source: Ambulatory Visit | Attending: Student in an Organized Health Care Education/Training Program | Admitting: Student in an Organized Health Care Education/Training Program

## 2023-06-10 VITALS — BP 135/85 | HR 80 | Temp 97.7°F | Ht 68.0 in | Wt 196.1 lb

## 2023-06-10 DIAGNOSIS — R3589 Other polyuria: Secondary | ICD-10-CM

## 2023-06-10 DIAGNOSIS — Z0000 Encounter for general adult medical examination without abnormal findings: Secondary | ICD-10-CM | POA: Insufficient documentation

## 2023-06-10 DIAGNOSIS — R600 Localized edema: Secondary | ICD-10-CM

## 2023-06-10 DIAGNOSIS — I10 Essential (primary) hypertension: Secondary | ICD-10-CM | POA: Diagnosis not present

## 2023-06-10 DIAGNOSIS — R42 Dizziness and giddiness: Secondary | ICD-10-CM | POA: Diagnosis not present

## 2023-06-10 LAB — D-DIMER, QUANTITATIVE: D-Dimer, Quant: 0.29 ug/mL-FEU (ref 0.00–0.50)

## 2023-06-10 NOTE — Assessment & Plan Note (Signed)
Reports elevated blood pressures in the 170s at home (measured in the AM before taking his meds). He was traveling and lost BP meds. Restarted 7/27. Blood Pressures are elevated in the office today but the patient is having lightheadedness. Continue taking Amlodipine 10mg  once daily and lisinopril 20mg  once daily. Will check BMP and adjust if necessary.

## 2023-06-10 NOTE — Assessment & Plan Note (Addendum)
The patient has been traveling recently (beginning of July) and had many hours of sitting on a bus. Reports edematous legs bilaterally on his way back. Has venous stasis as exam reveals venous stasis dermatitis, and he reports not using his compression stockings. However, there is concern that this could cause a PE or DVT. Given the timing of his trip and that he reports that the edema was bilateral, it may be unlikely that he had a DVT. D-Dimer will be checked.  -D-dimer -CBC

## 2023-06-10 NOTE — Assessment & Plan Note (Addendum)
Patient has been having lightheadedness since Saturday. He was laying in bed and stood up and felt the room was spinning. No vision changes. He laid in bed most of the day as he felt weak most of the day. He has been drinking more water and reports improvement. Reports similar symptoms on previous dehydration episodes in the past. Orthostatics today are negative, but could be that his dehydration is improving. A BMP will be checked. He also reports polyuria and polydipsia. No history of DM. On antiretroviral therapy that contains an integrase inhibitor. Last Hgb A1C was measured last year and was WNL. He could also have an infection such as urethritis concerning for gonorrhea.  -Check BMP today -Check Hgb A1c today  -GC testing

## 2023-06-10 NOTE — Patient Instructions (Signed)
Thank you, Colin Alexander for allowing [place] to provide your care today.   I have ordered the following labs for you:   Lab Orders         CBC no Diff         BMP8+Anion Gap         D-dimer, quantitative (not at [facility])         Hemoglobin A1c       I will follow up with you on the results of your labs today.   Should you have any questions or concerns please call the internal medicine clinic at [phone].     Manuela Neptune, MD  [facility]

## 2023-06-10 NOTE — Progress Notes (Signed)
CC:  Chief Complaint  Patient presents with   Hypertension    PT STATED THAT HE WAS OUT OF TOWN FOR 2 WKS AND WAS NOT TAKING HIS MEDS HIS BOTTOM NUMBER IS RUNNING HIGH AND NOT FEELING WELL NOW HE HAS STARTED TAKING THEM BACK NOW SINCE 06/06/23 BUT CONTINUES TO NOT FEEL WELL       HPI:  Colin Alexander is a 57 y.o. male living with a history stated below and presents today for concerns with his blood pressure. Please see problem based assessment and plan for additional details.  Past Medical History:  Diagnosis Date   Anogenital herpes simplex virus (HSV) infection    Ear pain, bilateral 09/23/2022   Flu-like symptoms 11/12/2022   Cough on New year's eve. Worsened to include green phlgem, dizziness, chills. No termometer but feels like he has a fever. Taking coughing medicine.  No sore throat, has headaches, no sinus pains, has myalgias, coughing not worsening but not getting better, before dark green then light green. Had flu vaccine here. Had covid vaccine with booster, no RSV vaccine. Having intermittant ear pain. Everyo   HIV (human immunodeficiency virus infection) (HCC)    Hypertension     Current Outpatient Medications on File Prior to Visit  Medication Sig Dispense Refill   amLODipine (NORVASC) 10 MG tablet TAKE 1 TABLET(10 MG) BY MOUTH DAILY 90 tablet 3   Aspirin-Acetaminophen-Caffeine (GOODYS EXTRA STRENGTH PO) Take 1 tablet by mouth as needed (Migraines).     BIKTARVY 50-200-25 MG TABS tablet TAKE 1 TABLET BY MOUTH DAILY. 30 tablet 11   fluticasone (FLONASE) 50 MCG/ACT nasal spray Place 1 spray into both nostrils daily. 9.9 mL 2   lisinopril (ZESTRIL) 20 MG tablet TAKE 1 TABLET(20 MG) BY MOUTH DAILY 90 tablet 3   meloxicam (MOBIC) 15 MG tablet Take one pill a day with food for 3 days and then prn thereafter 30 tablet 0   rosuvastatin (CRESTOR) 5 MG tablet Take 1 tablet (5 mg total) by mouth daily. 30 tablet 11   valACYclovir (VALTREX) 1000 MG tablet Take 1 tab daily  by mouth 30 tablet 5   Current Facility-Administered Medications on File Prior to Visit  Medication Dose Route Frequency Provider Last Rate Last Admin   0.9 %  sodium chloride infusion  500 mL Intravenous Continuous Nandigam, Eleonore Chiquito, MD        Family History  Problem Relation Age of Onset   Heart attack Mother        Stent placement   Hypertension Mother    Colon polyps Father    Heart attack Father        CABG   Hypertension Father    Heart attack Cousin        In his 26's deceased.   Colon cancer Neg Hx    Esophageal cancer Neg Hx    Rectal cancer Neg Hx    Stomach cancer Neg Hx     Social History   Socioeconomic History   Marital status: Divorced    Spouse name: Not on file   Number of children: Not on file   Years of education: Not on file   Highest education level: Not on file  Occupational History   Not on file  Tobacco Use   Smoking status: Never   Smokeless tobacco: Never  Vaping Use   Vaping status: Never Used  Substance and Sexual Activity   Alcohol use: No    Alcohol/week: 0.0 standard drinks of  alcohol   Drug use: No   Sexual activity: Yes    Birth control/protection: Condom    Comment: declined condoms  Other Topics Concern   Not on file  Social History Narrative   Not on file   Social Determinants of Health   Financial Resource Strain: Low Risk  (02/24/2023)   Overall Financial Resource Strain (CARDIA)    Difficulty of Paying Living Expenses: Not hard at all  Food Insecurity: No Food Insecurity (02/24/2023)   Hunger Vital Sign    Worried About Running Out of Food in the Last Year: Never true    Ran Out of Food in the Last Year: Never true  Transportation Needs: No Transportation Needs (02/24/2023)   PRAPARE - Administrator, Civil Service (Medical): No    Lack of Transportation (Non-Medical): No  Physical Activity: Insufficiently Active (02/24/2023)   Exercise Vital Sign    Days of Exercise per Week: 2 days    Minutes of  Exercise per Session: 20 min  Stress: No Stress Concern Present (02/24/2023)   [employer] of Occupational Health - Occupational Stress Questionnaire    Feeling of Stress : Not at all  Social Connections: Moderately Integrated (02/24/2023)   Social Connection and Isolation Panel [NHANES]    Frequency of Communication with Friends and Family: More than three times a week    Frequency of Social Gatherings with Friends and Family: More than three times a week    Attends Religious Services: More than 4 times per year    Active Member of [employer] or Organizations: Yes    Attends Banker Meetings: 1 to 4 times per year    Marital Status: Divorced  Intimate Partner Violence: Not At Risk (02/24/2023)   Humiliation, Afraid, Rape, and Kick questionnaire    Fear of Current or Ex-Partner: No    Emotionally Abused: No    Physically Abused: No    Sexually Abused: No    Review of Systems: ROS negative except for what is noted on the assessment and plan.  Vitals:   06/10/23 1453  BP: 135/85  Pulse: 80  Temp: 97.7 F (36.5 C)  TempSrc: Oral  SpO2: 98%  Weight: 196 lb 1.6 oz (89 kg)  Height: 5\' 8"  (1.727 m)    Physical Exam: Physical Exam Vitals reviewed.  Constitutional:      General: He is not in acute distress.    Appearance: He is not ill-appearing.  HENT:     Nose: No congestion or rhinorrhea.     Mouth/Throat:     Mouth: Mucous membranes are dry.  Eyes:     Extraocular Movements: Extraocular movements intact.     Pupils: Pupils are equal, round, and reactive to light.  Cardiovascular:     Rate and Rhythm: Normal rate and regular rhythm.     Heart sounds: S1 normal and S2 normal. No murmur heard.    No friction rub. No gallop.  Pulmonary:     Effort: No tachypnea, accessory muscle usage or respiratory distress.     Breath sounds: No wheezing, rhonchi or rales.  Abdominal:     General: There is no distension.     Palpations: Abdomen is soft.     Tenderness:  There is no abdominal tenderness. There is no right CVA tenderness, left CVA tenderness or guarding.  Musculoskeletal:        General: No swelling or tenderness.     Cervical back: No rigidity.  Right lower leg: No edema.     Left lower leg: No edema.  Lymphadenopathy:     Cervical: No cervical adenopathy.  Skin:    General: Skin is warm and dry.     Capillary Refill: Capillary refill takes less than 2 seconds.     Comments: Venous stasis dermatitis on BLE  Neurological:     Mental Status: He is alert and oriented to person, place, and time.     Cranial Nerves: No cranial nerve deficit.     Sensory: No sensory deficit.     Motor: No weakness.     Coordination: Coordination normal.     Gait: Gait normal.      Assessment & Plan:    Patient seen with Dr. Oswaldo Done.   HTN (hypertension) Reports elevated blood pressures in the 170s at home (measured in the AM before taking his meds). He was traveling and lost BP meds. Restarted 7/27. Blood Pressures are elevated in the office today but the patient is having lightheadedness. Continue taking Amlodipine 10mg  once daily and lisinopril 20mg  once daily. Will check BMP and adjust if necessary.   Lightheadedness Patient has been having lightheadedness since Saturday. He was laying in bed and stood up and felt the room was spinning. No vision changes. He laid in bed most of the day as he felt weak most of the day. He has been drinking more water and reports improvement. Reports similar symptoms on previous dehydration episodes in the past. Orthostatics today are negative, but could be that his dehydration is improving. A BMP will be checked. He also reports polyuria and polydipsia. No history of DM. On antiretroviral therapy that contains an integrase inhibitor. Last Hgb A1C was measured last year and was WNL. He could also have an infection such as urethritis concerning for gonorrhea.  -Check BMP today -Check Hgb A1c today  -GC testing      Bilateral leg edema The patient has been traveling recently (beginning of July) and had many hours of sitting on a bus. Reports edematous legs bilaterally on his way back. Has venous stasis as exam reveals venous stasis dermatitis, and he reports not using his compression stockings. However, there is concern that this could cause a PE or DVT. Given the timing of his trip and that he reports that the edema was bilateral, it may be unlikely that he had a DVT. D-Dimer will be checked.  -D-dimer -CBC    Manuela Neptune, MD  [facility] Internal Medicine, PGY-1 Phone: [phone] Date 06/10/2023 Time 4:18 PM

## 2023-06-10 NOTE — Progress Notes (Signed)
   Established Patient Office Visit  Subjective   [name], male    DOB: 12/31/1965  Age: 57 y.o. MRN: [id number]  Chief Complaint  Patient presents with   Hypertension    PT STATED THAT HE WAS OUT OF TOWN FOR 2 WKS AND WAS NOT TAKING HIS MEDS HIS BOTTOM NUMBER IS RUNNING HIGH AND NOT FEELING WELL NOW HE HAS STARTED TAKING THEM BACK NOW SINCE 06/06/23 BUT CONTINUES TO NOT FEEL WELL      HPI  Peeing like a "race horse". Nauseous. Lack of appetite. Tightness around the shoulder. Increased thirst.  Going on for a couple months.  Hbg A1C 4.8 last year.    Drinking a lot of water. More than usual since his trip. 2-3L since Sunday.   Was going to the bathroom. Got lightheaded. Stood up and staggered. This was Saturday. Improving since then. Gets better when he laid down. It has been a while. Has hx of dehydration. Similar episodes when he was dehydrated.    Concerned with blood pressuer.    Pain in the ear.         ROS    Objective:     BP 135/85 (BP Location: Left Arm, Patient Position: Sitting, Cuff Size: Normal)   Pulse 80   Temp 97.7 F (36.5 C) (Oral)   Ht 5\' 8"  (1.727 m)   Wt 196 lb 1.6 oz (89 kg)   SpO2 98%   BMI 29.82 kg/m    Physical Exam   No results found for any visits on 06/10/23.    The 10-year ASCVD risk score (Arnett DK, et al., 2019) is: 13.5%    Assessment & Plan:   Problem List Items Addressed This Visit   None   No follow-ups on file.    Manuela Neptune, MD

## 2023-06-11 NOTE — Progress Notes (Signed)
Spoke to patient to reassure him about his lab results. He will track his blood pressures to see if there are any signs of hypotension, although he reports having elevated blood pressures at home. Will bring BP measurements at his next visit to address meds if needed. Should episodes of lightheadedness continue and blood pressures are reassuring, we could consider a CT Head.

## 2023-06-11 NOTE — Progress Notes (Signed)
Internal Medicine Clinic Attending  I was physically present during the key portions of the resident provided service and participated in the medical decision making of patient's management care. I reviewed pertinent patient test results.  The assessment, diagnosis, and plan were formulated together and I agree with the documentation in the resident's note.  57 year old person living with well-controlled HIV came to clinic today for evaluation of episodic dizziness upon standing.  Symptoms are now resolved, but did last for several days and put him at risk for syncope and fall.  No orthostatic hypotension today on exam.  He appears well, euvolemic, trace bilateral pitting edema.  Blood work is reassuring with no anemia or electrolyte abnormalities.  He did have two very long car rides prior to the symptoms, at risk for VTE, we checked a D-dimer which was normal so I think VTE is low risk.  We will suggest continuing with his 2 blood pressure medications for now, follow-up with [place] if symptoms return, would also recommend checking more ambulatory blood pressures when he is symptomatic to see if he is having transient hypotension.  Erlinda Hong, MD FACP

## 2023-07-10 ENCOUNTER — Other Ambulatory Visit: Payer: Self-pay | Admitting: Internal Medicine

## 2023-07-10 DIAGNOSIS — B009 Herpesviral infection, unspecified: Secondary | ICD-10-CM

## 2023-09-03 ENCOUNTER — Telehealth: Payer: Self-pay | Admitting: *Deleted

## 2023-09-03 NOTE — Telephone Encounter (Signed)
Call from pt c/o having constant BM's x 3 days. He states it's not diarrhea but "soft " BM's. He works at [employer]. He had the flu vaccine. C/o dry mouth. Denies fever/cough. He has not tried OTC med like Imodium. I advises pt to stay hydrated and try Imodium and call back if this does not help and/or symptoms worsen. And I will send his concern to the doctor.

## 2023-09-04 ENCOUNTER — Other Ambulatory Visit: Payer: Self-pay | Admitting: Internal Medicine

## 2023-09-04 ENCOUNTER — Telehealth: Payer: Self-pay

## 2023-09-04 DIAGNOSIS — B20 Human immunodeficiency virus [HIV] disease: Secondary | ICD-10-CM

## 2023-09-04 NOTE — Telephone Encounter (Signed)
Called patient to schedule f/u with Dr. Drue Second. Made contact, but patient stated that he was not home at the moment and would like to give [place] a call back once he is in front of his calendar to make sure of no conflicts.

## 2023-09-07 ENCOUNTER — Other Ambulatory Visit: Payer: Self-pay | Admitting: Internal Medicine

## 2023-09-07 DIAGNOSIS — B009 Herpesviral infection, unspecified: Secondary | ICD-10-CM

## 2023-09-11 ENCOUNTER — Encounter: Payer: BC Managed Care – PPO | Admitting: Student

## 2023-09-16 ENCOUNTER — Other Ambulatory Visit (HOSPITAL_COMMUNITY)
Admission: RE | Admit: 2023-09-16 | Discharge: 2023-09-16 | Disposition: A | Discharge status: Home / Self Care | Payer: BC Managed Care – PPO | Source: Ambulatory Visit | Attending: Internal Medicine | Admitting: Internal Medicine

## 2023-09-16 ENCOUNTER — Other Ambulatory Visit: Payer: Self-pay

## 2023-09-16 ENCOUNTER — Ambulatory Visit (INDEPENDENT_AMBULATORY_CARE_PROVIDER_SITE_OTHER): Payer: BC Managed Care – PPO | Admitting: Internal Medicine

## 2023-09-16 ENCOUNTER — Encounter: Payer: Self-pay | Admitting: Internal Medicine

## 2023-09-16 VITALS — BP 132/88 | HR 80 | Resp 16 | Ht 68.0 in | Wt 196.3 lb

## 2023-09-16 DIAGNOSIS — I10 Essential (primary) hypertension: Secondary | ICD-10-CM | POA: Diagnosis not present

## 2023-09-16 DIAGNOSIS — B20 Human immunodeficiency virus [HIV] disease: Secondary | ICD-10-CM | POA: Insufficient documentation

## 2023-09-16 DIAGNOSIS — R351 Nocturia: Secondary | ICD-10-CM | POA: Diagnosis not present

## 2023-09-16 DIAGNOSIS — Z79899 Other long term (current) drug therapy: Secondary | ICD-10-CM | POA: Diagnosis not present

## 2023-09-16 MED ORDER — LISINOPRIL 20 MG PO TABS: ORAL_TABLET | ORAL | 3 refills | Status: DC

## 2023-09-16 MED ORDER — BIKTARVY 50-200-25 MG PO TABS: 1.0000 | ORAL_TABLET | Freq: Every day | ORAL | 11 refills | Status: DC

## 2023-09-16 NOTE — Progress Notes (Signed)
[name], male   DOB: 01-Jun-1966, 57 y.o.   MRN: [id number]  HPI Colin Alexander is a 57yo M with hiv disease, well controlled on biktarvy, Cd 4 count 415/VL<20. Also with HTN  Insomnia for the patient Now concern that he has at risk for prostate cancer - since his father had   Outpatient Encounter Medications as of 09/16/2023  Medication Sig   amLODipine (NORVASC) 10 MG tablet TAKE 1 TABLET(10 MG) BY MOUTH DAILY   Aspirin-Acetaminophen-Caffeine (GOODYS EXTRA STRENGTH PO) Take 1 tablet by mouth as needed (Migraines).   BIKTARVY 50-200-25 MG TABS tablet TAKE 1 TABLET BY MOUTH DAILY.   lisinopril (ZESTRIL) 20 MG tablet TAKE 1 TABLET(20 MG) BY MOUTH DAILY   meloxicam (MOBIC) 15 MG tablet Take one pill a day with food for 3 days and then prn thereafter   rosuvastatin (CRESTOR) 5 MG tablet Take 1 tablet (5 mg total) by mouth daily.   valACYclovir (VALTREX) 1000 MG tablet TAKE 1 TABLET BY MOUTH DAILY   fluticasone (FLONASE) 50 MCG/ACT nasal spray Place 1 spray into both nostrils daily.   Facility-Administered Encounter Medications as of 09/16/2023  Medication   0.9 %  sodium chloride infusion     Patient Active Problem List   Diagnosis Date Noted   Bilateral leg edema 06/10/2023   Lightheadedness 04/30/2023   Right knee injury, initial encounter 02/24/2023   Rhinosinusitis 07/22/2022   Chronic left hip pain 06/11/2022   Healthcare maintenance 12/13/2021   Family history of premature CAD 10/18/2012   HSV (herpes simplex virus) anogenital infection 06/07/2012   HIV (human immunodeficiency virus infection) (HCC) 01/13/2012   HTN (hypertension) 12/31/2011     Health Maintenance Due  Topic Date Due   Zoster Vaccines- Shingrix (1 of 2) Never done     Review of Systems Review of Systems  Constitutional: Negative for fever, chills, diaphoresis, activity change, appetite change, fatigue and unexpected weight change.  HENT: Negative for congestion, sore throat,  rhinorrhea, sneezing, trouble swallowing and sinus pressure.  Eyes: Negative for photophobia and visual disturbance.  Respiratory: Negative for cough, chest tightness, shortness of breath, wheezing and stridor.  Cardiovascular: Negative for chest pain, palpitations and leg swelling.  Gastrointestinal: Negative for nausea, vomiting, abdominal pain, diarrhea, constipation, blood in stool, abdominal distention and anal bleeding.  Genitourinary: Negative for dysuria, hematuria, flank pain and difficulty urinating.  Musculoskeletal: Negative for myalgias, back pain, joint swelling, arthralgias and gait problem.  Skin: Negative for color change, pallor, rash and wound.  Neurological: Negative for dizziness, tremors, weakness and light-headedness.  Hematological: Negative for adenopathy. Does not bruise/bleed easily.  Psychiatric/Behavioral: Negative for behavioral problems, confusion, sleep disturbance, dysphoric mood, decreased concentration and agitation.   Physical Exam   BP 132/88   Pulse 80   Resp 16   Ht 5\' 8"  (1.727 m)   Wt 196 lb 4.8 oz (89 kg)   BMI 29.85 kg/m   Physical Exam  Constitutional: He is oriented to person, place, and time. He appears well-developed and well-nourished. No distress.  HENT:  Mouth/Throat: Oropharynx is clear and moist. No oropharyngeal exudate.  Cardiovascular: Normal rate, regular rhythm and normal heart sounds. Exam reveals no gallop and no friction rub.  No murmur heard.  Pulmonary/Chest: Effort normal and breath sounds normal. No respiratory distress. He has no wheezes.  Abdominal: Soft. Bowel sounds are normal. He exhibits no distension. There is no tenderness.  Lymphadenopathy:  He has no cervical adenopathy.  Neurological: He is alert  and oriented to person, place, and time.  Skin: Skin is warm and dry. No rash noted. No erythema.  Psychiatric: He has a normal mood and affect. His behavior is normal.   Lab Results  Component Value Date    CD4TCELL 29 (L) 12/22/2022   Lab Results  Component Value Date   CD4TABS 415 12/22/2022   CD4TABS 486 09/02/2022   CD4TABS 413 05/22/2022   Lab Results  Component Value Date   HIV1RNAQUANT Not Detected 12/22/2022   Lab Results  Component Value Date   HEPBSAB POS (A) 01/17/2014   Lab Results  Component Value Date   LABRPR NON-REACTIVE 09/02/2022    CBC Lab Results  Component Value Date   WBC 7.3 06/10/2023   RBC 4.49 06/10/2023   HGB 14.8 06/10/2023   HCT 42.7 06/10/2023   PLT 241 06/10/2023   MCV 95 06/10/2023   MCH 33.0 06/10/2023   MCHC 34.7 06/10/2023   RDW 11.3 (L) 06/10/2023   LYMPHSABS 1,736 12/22/2022   MONOABS 0.5 09/05/2019   EOSABS 198 12/22/2022    BMET Lab Results  Component Value Date   NA 140 06/10/2023   K 3.4 (L) 06/10/2023   CL 99 06/10/2023   CO2 26 06/10/2023   GLUCOSE 81 06/10/2023   BUN 5 (L) 06/10/2023   CREATININE 0.86 06/10/2023   CALCIUM 9.2 06/10/2023   GFRNONAA 86 09/05/2020   GFRAA 100 09/05/2020      Assessment and Plan HIV disease = on biktarvy, doing well with adherence Needs labs  Long term medicaiton management = will check cr  Htn =  Received flu and covid vaccine at work   Nocturia = will check psa and  -- will try proscar  Insomnia = improve sleep hygiene. Do a trial of melatonin

## 2023-09-17 ENCOUNTER — Ambulatory Visit: Payer: BC Managed Care – PPO | Admitting: Internal Medicine

## 2023-09-17 LAB — URINALYSIS, ROUTINE W REFLEX MICROSCOPIC
Bacteria, UA: NONE SEEN /[HPF]
Bilirubin Urine: NEGATIVE
Glucose, UA: NEGATIVE
Hgb urine dipstick: NEGATIVE
Hyaline Cast: NONE SEEN /[LPF]
Ketones, ur: NEGATIVE
Leukocytes,Ua: NEGATIVE
Nitrite: NEGATIVE
Specific Gravity, Urine: 1.022 (ref 1.001–1.035)
Squamous Epithelial / HPF: NONE SEEN /[HPF] (ref <=5)
WBC, UA: NONE SEEN /[HPF] (ref 0–5)
pH: 5.5 (ref 5.0–8.0)

## 2023-09-17 LAB — URINE CULTURE
MICRO NUMBER:: 15694961
Result:: NO GROWTH
SPECIMEN QUALITY:: ADEQUATE

## 2023-09-17 LAB — CYTOLOGY, (ORAL, ANAL, URETHRAL) ANCILLARY ONLY
Chlamydia: NEGATIVE
Comment: NEGATIVE
Comment: NORMAL
Neisseria Gonorrhea: NEGATIVE

## 2023-09-17 LAB — URINE CYTOLOGY ANCILLARY ONLY
Chlamydia: NEGATIVE
Comment: NEGATIVE
Comment: NORMAL
Neisseria Gonorrhea: NEGATIVE

## 2023-09-17 LAB — MICROSCOPIC MESSAGE

## 2023-09-17 LAB — T-HELPER CELL (CD4) - (RCID CLINIC ONLY)
CD4 % Helper T Cell: 28 % — ABNORMAL LOW (ref 33–65)
CD4 T Cell Abs: 362 /uL — ABNORMAL LOW (ref 400–1790)

## 2023-09-19 LAB — CBC WITH DIFFERENTIAL/PLATELET
Absolute Lymphocytes: 1447 {cells}/uL (ref 850–3900)
Absolute Monocytes: 435 {cells}/uL (ref 200–950)
Basophils Absolute: 88 {cells}/uL (ref 0–200)
Basophils Relative: 1.6 %
Eosinophils Absolute: 281 {cells}/uL (ref 15–500)
Eosinophils Relative: 5.1 %
HCT: 47.1 % (ref 38.5–50.0)
Hemoglobin: 15.9 g/dL (ref 13.2–17.1)
MCH: 33.9 pg — ABNORMAL HIGH (ref 27.0–33.0)
MCHC: 33.8 g/dL (ref 32.0–36.0)
MCV: 100.4 fL — ABNORMAL HIGH (ref 80.0–100.0)
MPV: 10.9 fL (ref 7.5–12.5)
Monocytes Relative: 7.9 %
Neutro Abs: 3251 {cells}/uL (ref 1500–7800)
Neutrophils Relative %: 59.1 %
Platelets: 260 10*3/uL (ref 140–400)
RBC: 4.69 10*6/uL (ref 4.20–5.80)
RDW: 11.7 % (ref 11.0–15.0)
Total Lymphocyte: 26.3 %
WBC: 5.5 10*3/uL (ref 3.8–10.8)

## 2023-09-19 LAB — COMPLETE METABOLIC PANEL WITH GFR
AG Ratio: 1.5 (calc) (ref 1.0–2.5)
ALT: 19 U/L (ref 9–46)
AST: 13 U/L (ref 10–35)
Albumin: 4.5 g/dL (ref 3.6–5.1)
Alkaline phosphatase (APISO): 50 U/L (ref 35–144)
BUN: 9 mg/dL (ref 7–25)
CO2: 29 mmol/L (ref 20–32)
Calcium: 9.5 mg/dL (ref 8.6–10.3)
Chloride: 102 mmol/L (ref 98–110)
Creat: 0.93 mg/dL (ref 0.70–1.30)
Globulin: 3.1 g/dL (ref 1.9–3.7)
Glucose, Bld: 70 mg/dL (ref 65–99)
Potassium: 3.4 mmol/L — ABNORMAL LOW (ref 3.5–5.3)
Sodium: 140 mmol/L (ref 135–146)
Total Bilirubin: 0.8 mg/dL (ref 0.2–1.2)
Total Protein: 7.6 g/dL (ref 6.1–8.1)
eGFR: 95 mL/min/{1.73_m2} (ref >=60)

## 2023-09-19 LAB — LIPID PANEL
Cholesterol: 113 mg/dL (ref <200)
HDL: 42 mg/dL (ref >=40)
LDL Cholesterol (Calc): 55 mg/dL
Non-HDL Cholesterol (Calc): 71 mg/dL (ref <130)
Total CHOL/HDL Ratio: 2.7 (calc) (ref <5.0)
Triglycerides: 82 mg/dL (ref <150)

## 2023-09-19 LAB — RPR: RPR Ser Ql: NONREACTIVE

## 2023-09-19 LAB — HEMOGLOBIN A1C
Hgb A1c MFr Bld: 4.8 %{Hb} (ref <5.7)
Mean Plasma Glucose: 91 mg/dL
eAG (mmol/L): 5 mmol/L

## 2023-09-19 LAB — HIV-1 RNA QUANT-NO REFLEX-BLD
HIV 1 RNA Quant: NOT DETECTED {copies}/mL
HIV-1 RNA Quant, Log: NOT DETECTED {Log_copies}/mL

## 2023-09-19 LAB — PSA: PSA: 1.12 ng/mL (ref <=4.00)

## 2023-09-21 ENCOUNTER — Telehealth: Payer: Self-pay

## 2023-09-21 NOTE — Telephone Encounter (Signed)
Pt states he is not feeling well, having chill, vomiting and diarrhea. Requesting to speak with a nurse about getting OTC medications. Pt states he do not want to go to urgent care for this. Please call pt back.

## 2023-09-21 NOTE — Telephone Encounter (Signed)
Return pt's call. Stated he woke up with chills to feeling hot this am. Colin Alexander to work; threw-up so he went home.Stated his BM's are loose. Stated he feels somewhat better at this moment. He did travel to [facility] over the w/e to a funeral. Advised pt take a covid test, stay hydrated , and eat a bland diet (BRAT) - stated he will. Also informed pt to call back if symptoms worsen.

## 2023-09-23 LAB — CYTOLOGY - PAP
Adequacy: ABNORMAL
Chlamydia: NEGATIVE
Comment: NEGATIVE
Comment: NORMAL
Neisseria Gonorrhea: NEGATIVE

## 2023-10-06 ENCOUNTER — Other Ambulatory Visit: Payer: Self-pay

## 2023-10-06 DIAGNOSIS — B20 Human immunodeficiency virus [HIV] disease: Secondary | ICD-10-CM

## 2023-10-06 MED ORDER — BIKTARVY 50-200-25 MG PO TABS: 1.0000 | ORAL_TABLET | Freq: Every day | ORAL | 4 refills | Status: DC

## 2023-10-13 ENCOUNTER — Other Ambulatory Visit (HOSPITAL_COMMUNITY): Payer: Self-pay

## 2023-12-10 ENCOUNTER — Other Ambulatory Visit: Payer: Self-pay | Admitting: Student

## 2023-12-10 NOTE — Progress Notes (Signed)
Received page on on-call pager at 3:40AM. Pt states that woke up dizzy, and it's been happening all week. He feels lightheaded and like the room is spinning around him. He is not able to identify any discernable situation in which this happens, such as position changes or voiding. He also states that he feels very thirsty, but doesn't drink enough water. He is unable to quantify how much he does drink. Of note, he does have hypertension, for which he takes amlodipine and lisinopril for. He does not check his blood pressure at home, as he can't find his blood pressure cuff.   Overall, symptoms seem consistent with dehydration causing intermittent light-headed symptoms. Thankfully he has not passed out or has a loss of consciousness. Him not having enough fluid intake mixed with him taking his anti-hypertensive could be responsible, however it appears he is not having orthostatic symptoms. I encouraged pt to keep up with his fluid intake, and try checking his blood pressure at home once he does find his cuff. Will try to schedule an appointment with the Internal Medicine center soon for close follow up, may need titration of his medications.   Olegario Messier, MD  PGY-2 Internal Medicine Resident

## 2023-12-16 ENCOUNTER — Encounter: Payer: Self-pay | Admitting: Internal Medicine

## 2023-12-16 ENCOUNTER — Ambulatory Visit: Payer: 59 | Admitting: Internal Medicine

## 2023-12-16 ENCOUNTER — Other Ambulatory Visit: Payer: Self-pay

## 2023-12-16 VITALS — BP 121/86 | HR 85 | Resp 16 | Ht 68.0 in | Wt 189.4 lb

## 2023-12-16 DIAGNOSIS — R040 Epistaxis: Secondary | ICD-10-CM

## 2023-12-16 DIAGNOSIS — Z79899 Other long term (current) drug therapy: Secondary | ICD-10-CM

## 2023-12-16 DIAGNOSIS — R351 Nocturia: Secondary | ICD-10-CM

## 2023-12-16 DIAGNOSIS — B20 Human immunodeficiency virus [HIV] disease: Secondary | ICD-10-CM

## 2023-12-16 MED ORDER — TAMSULOSIN HCL 0.4 MG PO CAPS: 0.4000 mg | ORAL_CAPSULE | Freq: Every day | ORAL | 3 refills | Status: DC

## 2023-12-16 MED ORDER — BIKTARVY 50-200-25 MG PO TABS: 1.0000 | ORAL_TABLET | Freq: Every day | ORAL | 4 refills | Status: DC

## 2023-12-16 NOTE — Progress Notes (Signed)
 Hiv disease  [name], male   DOB: 18-Jul-1966, 58 y.o.   MRN: [id number]  HPI Colin Alexander is a 57 year old male with HIV who presents for a follow-up visit.  He has been doing well overall with his HIV management and has not had any new sexual encounters since his last visit. He is concerned about the need to repeat a previous anal Pap test that was unsatisfactory.  He experiences frequent urination, particularly nocturia, requiring him to urinate multiple times during the night and sometimes needing to stop while driving. This is exacerbated by fluid intake, with urination occurring every thirty minutes at times. There is no dysuria. He has not yet discussed these symptoms with his primary care provider.  He has lost approximately seven pounds since November, attributing it to dietary changes and portion control. His diet includes reduced sweets and smaller portions, with veggie burgers and mushrooms as meat substitutes during a recent fast. He has not been engaging in much physical activity but plans to start walking indoors at his workplace gym.  He experiences epistaxis, which he attributes to dry air. He uses a Q-tip with Vaseline as a remedy, which he finds effective. No recent flu-like symptoms, fevers, chills, or night sweats.  He is currently taking medications for his HIV and is concerned about refills running low, although he is unsure which medication is affected.  Outpatient Encounter Medications as of 12/16/2023  Medication Sig   amLODipine  (NORVASC ) 10 MG tablet TAKE 1 TABLET(10 MG) BY MOUTH DAILY   Aspirin -Acetaminophen -Caffeine (GOODYS EXTRA STRENGTH PO) Take 1 tablet by mouth as needed (Migraines).   bictegravir-emtricitabine -tenofovir  AF (BIKTARVY ) 50-200-25 MG TABS tablet Take 1 tablet by mouth daily.   lisinopril  (ZESTRIL ) 20 MG tablet TAKE 1 TABLET(20 MG) BY MOUTH DAILY   meloxicam  (MOBIC ) 15 MG tablet Take one pill a day with food for 3  days and then prn thereafter   rosuvastatin  (CRESTOR ) 5 MG tablet Take 1 tablet (5 mg total) by mouth daily.   valACYclovir  (VALTREX ) 1000 MG tablet TAKE 1 TABLET BY MOUTH DAILY   fluticasone  (FLONASE ) 50 MCG/ACT nasal spray Place 1 spray into both nostrils daily.   Facility-Administered Encounter Medications as of 12/16/2023  Medication   0.9 %  sodium chloride  infusion     Patient Active Problem List   Diagnosis Date Noted   Bilateral leg edema 06/10/2023   Lightheadedness 04/30/2023   Right knee injury, initial encounter 02/24/2023   Rhinosinusitis 07/22/2022   Chronic left hip pain 06/11/2022   Healthcare maintenance 12/13/2021   Family history of premature CAD 10/18/2012   HSV (herpes simplex virus) anogenital infection 06/07/2012   HIV (human immunodeficiency virus infection) (HCC) 01/13/2012   HTN (hypertension) 12/31/2011     Health Maintenance Due  Topic Date Due   Zoster Vaccines- Shingrix  (1 of 2) Never done   COVID-19 Vaccine (4 - 2024-25 season) 10/06/2023     Review of Systems +nocturia no dysuria. 12 point ros is negative Physical Exam   BP 121/86   Pulse 85   Resp 16   Ht 5' 8 (1.727 m)   Wt 189 lb 6.4 oz (85.9 kg)   BMI 28.80 kg/m   Physical Exam  Constitutional: He is oriented to person, place, and time. He appears well-developed and well-nourished. No distress.  HENT:  Mouth/Throat: Oropharynx is clear and moist. No oropharyngeal exudate.  Cardiovascular: Normal rate, regular rhythm and normal heart sounds. Exam reveals no  gallop and no friction rub.  No murmur heard.  Pulmonary/Chest: Effort normal and breath sounds normal. No respiratory distress. He has no wheezes.  Lymphadenopathy:  He has no cervical adenopathy.  Neurological: He is alert and oriented to person, place, and time.  Skin: Skin is warm and dry. No rash noted. No erythema.  Psychiatric: He has a normal mood and affect. His behavior is normal.   Lab Results  Component Value  Date   CD4TCELL 28 (L) 09/16/2023   Lab Results  Component Value Date   CD4TABS 362 (L) 09/16/2023   CD4TABS 415 12/22/2022   CD4TABS 486 09/02/2022   Lab Results  Component Value Date   HIV1RNAQUANT Not Detected 09/16/2023   Lab Results  Component Value Date   HEPBSAB POS (A) 01/17/2014   Lab Results  Component Value Date   LABRPR NON-REACTIVE 09/16/2023    CBC Lab Results  Component Value Date   WBC 5.5 09/16/2023   RBC 4.69 09/16/2023   HGB 15.9 09/16/2023   HCT 47.1 09/16/2023   PLT 260 09/16/2023   MCV 100.4 (H) 09/16/2023   MCH 33.9 (H) 09/16/2023   MCHC 33.8 09/16/2023   RDW 11.7 09/16/2023   LYMPHSABS 1,736 12/22/2022   MONOABS 0.5 09/05/2019   EOSABS 281 09/16/2023    BMET Lab Results  Component Value Date   NA 140 09/16/2023   K 3.4 (L) 09/16/2023   CL 102 09/16/2023   CO2 29 09/16/2023   GLUCOSE 70 09/16/2023   BUN 9 09/16/2023   CREATININE 0.93 09/16/2023   CALCIUM  9.5 09/16/2023   GFRNONAA 86 09/05/2020   GFRAA 100 09/05/2020      Assessment and Plan HIV Follow-up Routine follow-up for HIV management. No new symptoms or complications. Compliant with medications and no recent sexual encounters. Weight loss of 7 pounds since November, attributed to diet and exercise. - Repeat anal Pap test due to unsatisfactory sample from November - Send medication refills - will check labs to see that still well controlled  Nocturia Frequent urination, possibly related to age-related prostate issues. Differential diagnosis includes BPH, UTI, and diabetes. Previous PSA test was normal. Discussed starting tamsulosin  to reduce nocturia, with potential side effects including hypotension and dizziness. Advised to monitor for these symptoms and report any issues. - Check PSA levels - Perform urinalysis to rule out UTI - Check for diabetes - Prescribe tamsulosin  to be taken after supper, monitor for hypotension and dizziness  Longer medication management -  will check kidney function  Epistaxis Intermittent bloody nasal discharge likely due to dry air. No other symptoms reported. Advised using a Q-tip with Vaseline to moisturize nasal passages. - Advise use of Q-tip with Vaseline to moisturize nasal passages -appears improved  General Health Maintenance Up to date with vaccinations. Actively managing weight through diet and exercise. Discussed importance of moderation in sweets, portion control, drinking water first thing in the morning, and regular stretching. - Continue current diet and exercise regimen - Encourage moderation in sweets and portion control - Advise drinking water first thing in the morning and regular stretching  Intentional weight loss = continue with diet modification  Follow-up - Schedule follow-up appointment in three months. --------------

## 2023-12-17 LAB — T-HELPER CELL (CD4) - (RCID CLINIC ONLY)
CD4 % Helper T Cell: 28 % — ABNORMAL LOW (ref 33–65)
CD4 T Cell Abs: 276 /uL — ABNORMAL LOW (ref 400–1790)

## 2023-12-18 LAB — URINALYSIS, ROUTINE W REFLEX MICROSCOPIC
Bilirubin Urine: NEGATIVE
Glucose, UA: NEGATIVE
Hgb urine dipstick: NEGATIVE
Ketones, ur: NEGATIVE
Leukocytes,Ua: NEGATIVE
Nitrite: NEGATIVE
Protein, ur: NEGATIVE
Specific Gravity, Urine: 1.02 (ref 1.001–1.035)
pH: 6.5 (ref 5.0–8.0)

## 2023-12-18 LAB — CBC WITH DIFFERENTIAL/PLATELET
Absolute Lymphocytes: 1025 {cells}/uL (ref 850–3900)
Absolute Monocytes: 352 {cells}/uL (ref 200–950)
Basophils Absolute: 40 {cells}/uL (ref 0–200)
Basophils Relative: 0.9 %
Eosinophils Absolute: 132 {cells}/uL (ref 15–500)
Eosinophils Relative: 3 %
HCT: 47.7 % (ref 38.5–50.0)
Hemoglobin: 15.8 g/dL (ref 13.2–17.1)
MCH: 33.4 pg — ABNORMAL HIGH (ref 27.0–33.0)
MCHC: 33.1 g/dL (ref 32.0–36.0)
MCV: 100.8 fL — ABNORMAL HIGH (ref 80.0–100.0)
MPV: 11 fL (ref 7.5–12.5)
Monocytes Relative: 8 %
Neutro Abs: 2851 {cells}/uL (ref 1500–7800)
Neutrophils Relative %: 64.8 %
Platelets: 253 10*3/uL (ref 140–400)
RBC: 4.73 10*6/uL (ref 4.20–5.80)
RDW: 11.4 % (ref 11.0–15.0)
Total Lymphocyte: 23.3 %
WBC: 4.4 10*3/uL (ref 3.8–10.8)

## 2023-12-18 LAB — COMPLETE METABOLIC PANEL WITH GFR
AG Ratio: 1.5 (calc) (ref 1.0–2.5)
ALT: 20 U/L (ref 9–46)
AST: 16 U/L (ref 10–35)
Albumin: 4.7 g/dL (ref 3.6–5.1)
Alkaline phosphatase (APISO): 45 U/L (ref 35–144)
BUN: 11 mg/dL (ref 7–25)
CO2: 31 mmol/L (ref 20–32)
Calcium: 9.8 mg/dL (ref 8.6–10.3)
Chloride: 103 mmol/L (ref 98–110)
Creat: 0.93 mg/dL (ref 0.70–1.30)
Globulin: 3.1 g/dL (ref 1.9–3.7)
Glucose, Bld: 90 mg/dL (ref 65–99)
Potassium: 4 mmol/L (ref 3.5–5.3)
Sodium: 142 mmol/L (ref 135–146)
Total Bilirubin: 0.8 mg/dL (ref 0.2–1.2)
Total Protein: 7.8 g/dL (ref 6.1–8.1)
eGFR: 95 mL/min/{1.73_m2} (ref >=60)

## 2023-12-18 LAB — HIV-1 RNA QUANT-NO REFLEX-BLD
HIV 1 RNA Quant: 26 {copies}/mL — ABNORMAL HIGH
HIV-1 RNA Quant, Log: 1.41 {Log} — ABNORMAL HIGH

## 2023-12-18 LAB — PSA: PSA: 1.13 ng/mL (ref <=4.00)

## 2023-12-18 LAB — RPR: RPR Ser Ql: NONREACTIVE

## 2023-12-22 ENCOUNTER — Encounter: Payer: Self-pay | Admitting: Student

## 2023-12-23 LAB — CYTOLOGY - PAP: Adequacy: ABNORMAL

## 2023-12-24 ENCOUNTER — Other Ambulatory Visit: Payer: Self-pay | Admitting: Internal Medicine

## 2024-02-11 ENCOUNTER — Telehealth: Payer: Self-pay | Admitting: Student

## 2024-02-11 NOTE — Telephone Encounter (Signed)
 Received after hours page. Returned call to patient and confirmed DOB. Patient reports groin pain with associated back pain for past 2 days. Reports hx of kidney stone he passed and had very similar symptoms. Rates pain about 6-7. Endorses urinary frequency but denies dysuria, hematuria or discharge. Denies fever or other systemic symptoms. Has not tried anything for pain. No signs of hernia or bulging over area. Tolerating po intake without any n/v. Advised patient to try Tylenol 1000 mg Q8H and if needed ibuprofen for pain management. Encouraged adequate fluid hydration. Discussed return precautions with patient and if worsening symptoms to seek emergent care. Otherwise if stable to f/u with clinic visit. He is overdue for [facility] visit and has missed past few appts. Will send message to front desk to schedule f/u visit. Patient verbalizes understanding of plan and all questions addressed.

## 2024-02-12 NOTE — Telephone Encounter (Signed)
 Patient called back to report that they passed a kidney stone while urinating at 8:74 am patient  stated they have been drinking clear fluids and that the aspirin advised by the provider helped relieve the pain. Patient is now feeling fine. Patient  requested a work excuse for today (02/12/2024), as they chose to stay home to rest, hydrate, and manage symptoms per provider recommendation. Follow-up appointment scheduled for 02/22/2024.

## 2024-02-12 NOTE — Telephone Encounter (Signed)
 Copied from CRM [id number]. Topic: Compliment - Provider (non-sensitive) >> Feb 12, 2024 11:12 AM Irine Seal wrote: Date of encounter: 02/11/2024 Details of compliment: They stated the provider's bedside manner was "superb and very reassuring" and that the experience overall was wonderful. Patient felt truly listened to and appreciated the care and attention they received. They wanted to express their gratitude for such a positive and compassionate experience. Who would the patient like to thank/see rewarded? Demetrius Revel, DO  On a scale of 1-10, how was your experience? 10  Route to [employer] officer, political party.

## 2024-02-15 NOTE — Telephone Encounter (Signed)
 Please see message below as the patient is asking for a RTN Work documentation.   Copied from CRM [id number]. Topic: General - Other >> Feb 12, 2024  2:35 PM Brittney F wrote: Reason for CRM: Patient called in to retrieve documentation to return to work. Patient had arrived at the office after hours and is wondering if the office can send over the return to work documentation via Earleen Reaper so he can print them out.  Callback Number: [phone]

## 2024-02-16 ENCOUNTER — Telehealth: Payer: Self-pay

## 2024-02-16 NOTE — Telephone Encounter (Signed)
 Open error

## 2024-02-16 NOTE — Telephone Encounter (Signed)
 Spoke to patient stating needs letter to return to work.  Was out passing a Kidney Stone on Friday.  Finally passed around 3-4 PM .  Please fax to [phone].

## 2024-02-16 NOTE — Telephone Encounter (Signed)
 Unable to reach pt regarding his letter. Letter has been completed and faxed back to [phone].

## 2024-02-16 NOTE — Telephone Encounter (Signed)
 Pt stated he passed the stone on Friday 02/12/24 and was out of work. He needs a letter for his job; stating he was out of work on Friday. Stated he needs the letter today if possible. Send via pt's My Chart.  Thanks

## 2024-02-16 NOTE — Telephone Encounter (Signed)
 Copied from CRM [id number]. Topic: General - Other >> Feb 16, 2024 12:41 PM Colin Alexander wrote: Reason for CRM: The patient returned Nurse Glenda's missed call pertaining to his request of a doctor excuse for 02/12/2024 due to passing a kidney stone that day and missing work. He is requesting for this to be faxed directly to  [employer]. The patient provided their fax number: [phone]. >> Feb 16, 2024  3:37 PM Philippa Chester F wrote: Patient called back stating the work excuse was still not faxed over to the number provided; Patient was warm transferred over to [employer] per their request.

## 2024-02-16 NOTE — Telephone Encounter (Signed)
 Pt requesting a letter to be done by today, stating he need it for work. Called was transferred to Nurse Venita Sheffield.

## 2024-02-16 NOTE — Telephone Encounter (Signed)
 Letter was done by Dr. Allena Katz . Letter was faxed and sent via My-Chart to patient.

## 2024-02-16 NOTE — Telephone Encounter (Signed)
 Attempted to call pt back 2X, unable to reach pt at this time. Letter for his job has been completed and faxed back to [phone].

## 2024-02-22 ENCOUNTER — Ambulatory Visit: Payer: Self-pay | Admitting: Student

## 2024-02-22 VITALS — BP 130/88 | HR 76 | Ht 68.0 in | Wt 196.9 lb

## 2024-02-22 DIAGNOSIS — Z87442 Personal history of urinary calculi: Secondary | ICD-10-CM | POA: Insufficient documentation

## 2024-02-22 DIAGNOSIS — B20 Human immunodeficiency virus [HIV] disease: Secondary | ICD-10-CM

## 2024-02-22 DIAGNOSIS — I872 Venous insufficiency (chronic) (peripheral): Secondary | ICD-10-CM | POA: Insufficient documentation

## 2024-02-22 DIAGNOSIS — I878 Other specified disorders of veins: Secondary | ICD-10-CM

## 2024-02-22 DIAGNOSIS — I10 Essential (primary) hypertension: Secondary | ICD-10-CM | POA: Diagnosis not present

## 2024-02-22 DIAGNOSIS — Z21 Asymptomatic human immunodeficiency virus [HIV] infection status: Secondary | ICD-10-CM

## 2024-02-22 DIAGNOSIS — Z8249 Family history of ischemic heart disease and other diseases of the circulatory system: Secondary | ICD-10-CM

## 2024-02-22 MED ORDER — AMLODIPINE BESYLATE 10 MG PO TABS: ORAL_TABLET | ORAL | 3 refills | Status: DC

## 2024-02-22 NOTE — Assessment & Plan Note (Addendum)
 Patient with skin findings concerning for venous stasis in bilateral legs. Also evidence of varicose veins. Discussed meaning of venous stasis and possible effects on skin, including dryness, fragility, and hyperpigmentation changes. Hyperpigmentation and trace swelling bilaterally on lower legs on physical exam. No evidence of skin ulcerations or injury. Made some recommendations and provided return precautions should swelling worsen or he experiences pain.  Plan - Patient has compression stockings he will start wearing - Patient to elevate legs - Keep skin moisturized

## 2024-02-22 NOTE — Assessment & Plan Note (Signed)
 BP 130/88 today. Patient taking amlodipine 10 mg daily and lisinopril 20 mg daily, tolerating well. Denies any chest pain, heart palpitations, headache, vision changes, or other acute issues. CV exam unremarkable other than trace edema which may be related to the amlodipine or venous stasis. BP well controlled, will not make adjustments to antihypertensive therapy today.  Plan - Continue amlodipine 10 mg daily, lisinopril 20 mg daily  - Sent in refill for amlodipine 10 mg

## 2024-02-22 NOTE — Assessment & Plan Note (Signed)
 Patient with recent concern for kidney stone early April. Per patient, experienced right sided groin pain and CVA tenderness similar to what he has experienced in the past with kidney stones. Called weekend line and was recommended to stay well hydrated and take Tylenol as needed. Followed recommendations. Had recently been prescribed Tamsulosin by Dr. Levern Reader for urinary frequency that might be due to age-related prostate issues. Patient feels like he recently passed stone as he had small amount of bloody output and instant relief after voiding sometime after 4/03 (when he talked to Dr. Jari Merles at [facility]). No symptoms today and physical exam unremarkable. No need for follow up UA. Will monitor.

## 2024-02-22 NOTE — Patient Instructions (Signed)
 Thank you, [name] for allowing us  to provide your care today. Today we discussed your recent kidney stone and chronic health problems.   I have ordered the following labs for you:  Lab Orders  No laboratory test(s) ordered today     Tests ordered today:  None  Referrals ordered today:   Referral Orders  No referral(s) requested today     I have ordered the following medication/changed the following medications:   Stop the following medications: Medications Discontinued During This Encounter  Medication Reason   fluticasone (FLONASE) 50 MCG/ACT nasal spray Patient has not taken in last 30 days   meloxicam (MOBIC) 15 MG tablet Patient has not taken in last 30 days   Aspirin-Acetaminophen-Caffeine (GOODYS EXTRA STRENGTH PO) Patient has not taken in last 30 days   amLODipine (NORVASC) 10 MG tablet Reorder     Start the following medications: Meds ordered this encounter  Medications   amLODipine (NORVASC) 10 MG tablet    Sig: TAKE 1 TABLET(10 MG) BY MOUTH DAILY    Dispense:  90 tablet    Refill:  3     Follow up: 6 months or sooner as needed   Remember:   - Please continue to take all of your regular medications, your blood pressure looked great today.   - For lower leg swelling/skin changes: likely due to venous stasis, which is when your blood pools in the legs/blood is not flowing as efficiently. You can elevate your legs, wear compression stockings, and keep your skin moisturized. If there is worsening in your swelling or there is any associated pain please give us  a call as this may require further evaluation.   Should you have any questions or concerns please call the internal medicine clinic at [phone].     [name], MD PGY-1 Internal Medicine Teaching Prog[facility]

## 2024-02-22 NOTE — Assessment & Plan Note (Signed)
 Patient follows with Dr. Levern Reader (ID), last seen 12/16/23 for routine lab work. Continues to take Biktarvy consistently. Discussed shingrix and pneumococcal vaccines, patient will discuss with Dr. Levern Reader at upcoming visit in May.

## 2024-02-22 NOTE — Assessment & Plan Note (Signed)
 Family history of CAD. Patient currently taking rosuvastatin 5 mg daily. Lipid panel 5 months ago with LDL 55. Will continue current therapy.  Plan - Continue rosuvastatin 5 mg daily

## 2024-02-22 NOTE — Progress Notes (Signed)
 Established Patient Office Visit  Subjective   [name], male    DOB: [birth year]  Age: 58 y.o. MRN: [id number]  Chief Complaint  Patient presents with   Medical Management of Chronic Issues    Pt states he's overdue for "check up"    Patient is a 58 y.o. with a past medical history stated below who presents today for follow-up for kidney stone and chronic medical conditions including hypertension, hyperlipidemia, and HIV. Please see problem based assessment and plan for additional details.      Past Medical History:  Diagnosis Date   Anogenital herpes simplex virus (HSV) infection    Ear pain, bilateral 09/23/2022   Flu-like symptoms 11/12/2022   Cough on New year's eve. Worsened to include green phlgem, dizziness, chills. No termometer but feels like he has a fever. Taking coughing medicine.  No sore throat, has headaches, no sinus pains, has myalgias, coughing not worsening but not getting better, before dark green then light green. Had flu vaccine here. Had covid vaccine with booster, no RSV vaccine. Having intermittant ear pain. Everyo   HIV (human immunodeficiency virus infection) (HCC)    Hypertension       Review of Systems  Constitutional:  Negative for fever.  Cardiovascular:  Negative for chest pain and palpitations.  Gastrointestinal:  Negative for abdominal pain, nausea and vomiting.  Genitourinary:  Positive for frequency. Negative for flank pain and hematuria.     Objective:     BP 130/88 (BP Location: Right Arm, Patient Position: Sitting, Cuff Size: Normal)   Pulse 76   Ht 5\' 8"  (1.727 m)   Wt 196 lb 14.4 oz (89.3 kg)   BMI 29.94 kg/m  BP Readings from Last 3 Encounters:  02/22/24 130/88  12/16/23 121/86  09/16/23 132/88   Wt Readings from Last 3 Encounters:  02/22/24 196 lb 14.4 oz (89.3 kg)  12/16/23 189 lb 6.4 oz (85.9 kg)  09/16/23 196 lb 4.8 oz (89 kg)   Physical Exam HENT:     Head: Normocephalic and atraumatic.   Cardiovascular:     Rate and Rhythm: Normal rate and regular rhythm.     Heart sounds: Normal heart sounds.  Pulmonary:     Effort: Pulmonary effort is normal.     Breath sounds: Normal breath sounds.  Abdominal:     General: Bowel sounds are normal.     Palpations: Abdomen is soft.  Musculoskeletal:        General: Normal range of motion.     Comments: Trace bilateral swelling, venous stasis skin changes bilaterally including hyperpigmentation of the skin, no skin ulceration/injury observed  Neurological:     General: No focal deficit present.     Mental Status: He is alert.  Psychiatric:        Mood and Affect: Mood normal.    No results found for any visits on 02/22/24.  Last hemoglobin A1c Lab Results  Component Value Date   HGBA1C 4.8 09/16/2023    The ASCVD Risk score (Arnett DK, et al., 2019) failed to calculate for the following reasons:   The valid total cholesterol range is 130 to 320 mg/dL    Assessment & Plan:   Problem List Items Addressed This Visit     HTN (hypertension) (Chronic)   BP 130/88 today. Patient taking amlodipine 10 mg daily and lisinopril 20 mg daily, tolerating well. Denies any chest pain, heart palpitations, headache, vision changes, or other acute issues. CV exam unremarkable  other than trace edema which may be related to the amlodipine or venous stasis. BP well controlled, will not make adjustments to antihypertensive therapy today.  Plan - Continue amlodipine 10 mg daily, lisinopril 20 mg daily  - Sent in refill for amlodipine 10 mg       Relevant Medications   amLODipine (NORVASC) 10 MG tablet   HIV (human immunodeficiency virus infection) (HCC) - Primary (Chronic)   Patient follows with Dr. Levern Reader (ID), last seen 12/16/23 for routine lab work. Continues to take Biktarvy consistently. Discussed shingrix and pneumococcal vaccines, patient will discuss with Dr. Levern Reader at upcoming visit in May.       Family history of premature CAD    Family history of CAD. Patient currently taking rosuvastatin 5 mg daily. Lipid panel 5 months ago with LDL 55. Will continue current therapy.  Plan - Continue rosuvastatin 5 mg daily       History of kidney stones   Patient with recent concern for kidney stone early April. Per patient, experienced right sided groin pain and CVA tenderness similar to what he has experienced in the past with kidney stones. Called weekend line and was recommended to stay well hydrated and take Tylenol as needed. Followed recommendations. Had recently been prescribed Tamsulosin by Dr. Levern Reader for urinary frequency that might be due to age-related prostate issues. Patient feels like he recently passed stone as he had small amount of bloody output and instant relief after voiding sometime after 4/03 (when he talked to Dr. Jari Merles at [facility]). No symptoms today and physical exam unremarkable. No need for follow up UA. Will monitor.       RESOLVED: Venous stasis dermatitis   Venous stasis   Patient with skin findings concerning for venous stasis in bilateral legs. Also evidence of varicose veins. Discussed meaning of venous stasis and possible effects on skin, including dryness, fragility, and hyperpigmentation changes. Hyperpigmentation and trace swelling bilaterally on lower legs on physical exam. No evidence of skin ulcerations or injury. Made some recommendations and provided return precautions should swelling worsen or he experiences pain.  Plan - Patient has compression stockings he will start wearing - Patient to elevate legs - Keep skin moisturized      Patient discussed with Dr. Jarvis Mesa.  Return in about 6 months (around 08/23/2024) for chronic health problems (HTN, HLD, HIV, venous stasis) .   [name], MD

## 2024-02-23 NOTE — Progress Notes (Signed)
 Internal Medicine Clinic Attending  Case discussed with the resident at the time of the visit.  We reviewed the resident's history and exam and pertinent patient test results.  I agree with the assessment, diagnosis, and plan of care documented in the resident's note.

## 2024-03-07 ENCOUNTER — Other Ambulatory Visit: Payer: Self-pay | Admitting: Internal Medicine

## 2024-03-07 DIAGNOSIS — B20 Human immunodeficiency virus [HIV] disease: Secondary | ICD-10-CM

## 2024-03-28 ENCOUNTER — Other Ambulatory Visit: Payer: Self-pay

## 2024-03-28 ENCOUNTER — Encounter: Payer: Self-pay | Admitting: Internal Medicine

## 2024-03-28 ENCOUNTER — Ambulatory Visit: Payer: 59 | Admitting: Internal Medicine

## 2024-03-28 VITALS — BP 117/80 | HR 71 | Resp 16 | Ht 68.0 in | Wt 193.2 lb

## 2024-03-28 DIAGNOSIS — Z79899 Other long term (current) drug therapy: Secondary | ICD-10-CM

## 2024-03-28 DIAGNOSIS — B20 Human immunodeficiency virus [HIV] disease: Secondary | ICD-10-CM

## 2024-03-28 DIAGNOSIS — B372 Candidiasis of skin and nail: Secondary | ICD-10-CM | POA: Diagnosis not present

## 2024-03-28 DIAGNOSIS — Z23 Encounter for immunization: Secondary | ICD-10-CM | POA: Diagnosis not present

## 2024-03-28 DIAGNOSIS — Z789 Other specified health status: Secondary | ICD-10-CM

## 2024-03-28 NOTE — Progress Notes (Signed)
 RFV: follow up for HIV disease  [name], male   DOB: 01-03-1966, 58 y.o.   MRN: [id number]  HPI Colin Alexander is a 58yo M with HIV disease, on biktarvy , who reports taking it daily without missing a dose. His health has been good but he reports that he had some financial difficulty cleared up. Family helped him out to buy a car After selling his father's home. His father's estate was divided among siblings and grandkids.   Outpatient Encounter Medications as of 03/28/2024  Medication Sig   amLODipine  (NORVASC ) 10 MG tablet TAKE 1 TABLET(10 MG) BY MOUTH DAILY   bictegravir-emtricitabine -tenofovir  AF (BIKTARVY ) 50-200-25 MG TABS tablet Take 1 tablet by mouth daily.   lisinopril  (ZESTRIL ) 20 MG tablet TAKE 1 TABLET(20 MG) BY MOUTH DAILY   rosuvastatin  (CRESTOR ) 5 MG tablet TAKE 1 TABLET(5 MG) BY MOUTH DAILY   tamsulosin  (FLOMAX ) 0.4 MG CAPS capsule Take 1 capsule (0.4 mg total) by mouth daily after supper.   valACYclovir  (VALTREX ) 1000 MG tablet TAKE 1 TABLET BY MOUTH DAILY   Facility-Administered Encounter Medications as of 03/28/2024  Medication   0.9 %  sodium chloride  infusion     Patient Active Problem List   Diagnosis Date Noted   History of kidney stones 02/22/2024   Venous stasis 02/22/2024   Bilateral leg edema 06/10/2023   Lightheadedness 04/30/2023   Right knee injury, initial encounter 02/24/2023   Rhinosinusitis 07/22/2022   Chronic left hip pain 06/11/2022   Healthcare maintenance 12/13/2021   Family history of premature CAD 10/18/2012   HSV (herpes simplex virus) anogenital infection 06/07/2012   HIV (human immunodeficiency virus infection) (HCC) 01/13/2012   HTN (hypertension) 12/31/2011     Health Maintenance Due  Topic Date Due   Zoster Vaccines- Shingrix  (1 of 2) Never done   COVID-19 Vaccine (4 - Pfizer risk 2024-25 season) 02/09/2024   Pneumococcal Vaccine 50-53 Years old (4 of 4 - PCV20 or PCV21) 04/19/2024     Review of  Systems Constitutional: Negative for fever, chills, diaphoresis, activity change, appetite change, fatigue and unexpected weight change.  HENT: Negative for congestion, sore throat, rhinorrhea, sneezing, trouble swallowing and sinus pressure.  Eyes: Negative for photophobia and visual disturbance.  Respiratory: Negative for cough, chest tightness, shortness of breath, wheezing and stridor.  Cardiovascular: Negative for chest pain, palpitations and leg swelling.  Gastrointestinal: Negative for nausea, vomiting, abdominal pain, diarrhea, constipation, blood in stool, abdominal distention and anal bleeding.  Genitourinary: Negative for dysuria, hematuria, flank pain and difficulty urinating.  Musculoskeletal: Negative for myalgias, back pain, joint swelling, arthralgias and gait problem.  Skin: Negative for color change, pallor, rash and wound.  Neurological: Negative for dizziness, tremors, weakness and light-headedness.  Hematological: Negative for adenopathy. Does not bruise/bleed easily.  Psychiatric/Behavioral: Negative for behavioral problems, confusion, sleep disturbance, dysphoric mood, decreased concentration and agitation.   Physical Exam   BP 117/80   Pulse 71   Resp 16   Ht 5\' 8"  (1.727 m)   Wt 193 lb 3.2 oz (87.6 kg)   SpO2 99%   BMI 29.38 kg/m   Physical Exam  Constitutional: He is oriented to person, place, and time. He appears well-developed and well-nourished. No distress.  HENT:  Mouth/Throat: Oropharynx is clear and moist. No oropharyngeal exudate.  Cardiovascular: Normal rate, regular rhythm and normal heart sounds. Exam reveals no gallop and no friction rub.  No murmur heard.  Pulmonary/Chest: Effort normal and breath sounds normal. No respiratory distress. He has no  wheezes.  Abdominal: Soft. Bowel sounds are normal. He exhibits no distension. There is no tenderness.  Lymphadenopathy:  He has no cervical adenopathy.  Neurological: He is alert and oriented to  person, place, and time.  Skin: Skin is warm and dry. No rash noted. No erythema.  Psychiatric: He has a normal mood and affect. His behavior is normal.   Lab Results  Component Value Date   CD4TCELL 28 (L) 12/16/2023   Lab Results  Component Value Date   CD4TABS 276 (L) 12/16/2023   CD4TABS 362 (L) 09/16/2023   CD4TABS 415 12/22/2022   Lab Results  Component Value Date   HIV1RNAQUANT 26 (H) 12/16/2023   Lab Results  Component Value Date   HEPBSAB POS (A) 01/17/2014   Lab Results  Component Value Date   LABRPR NON-REACTIVE 12/16/2023    CBC Lab Results  Component Value Date   WBC 4.4 12/16/2023   RBC 4.73 12/16/2023   HGB 15.8 12/16/2023   HCT 47.7 12/16/2023   PLT 253 12/16/2023   MCV 100.8 (H) 12/16/2023   MCH 33.4 (H) 12/16/2023   MCHC 33.1 12/16/2023   RDW 11.4 12/16/2023   LYMPHSABS 1,736 12/22/2022   MONOABS 0.5 09/05/2019   EOSABS 132 12/16/2023    BMET Lab Results  Component Value Date   NA 142 12/16/2023   K 4.0 12/16/2023   CL 103 12/16/2023   CO2 31 12/16/2023   GLUCOSE 90 12/16/2023   BUN 11 12/16/2023   CREATININE 0.93 12/16/2023   CALCIUM  9.8 12/16/2023   GFRNONAA 86 09/05/2020   GFRAA 100 09/05/2020      Assessment and Plan HIV disease = will repeat CD 4 count and viral load  Long term medication management = cr appears stable  Health maintenance = Shingles and pneumonia vaccine Repeat pap smear since last collection was unsatisfactory  Candidal infection of groin = can continue with topical antifungal  Repeat CD 4 count   Rtc in 3 months

## 2024-03-29 LAB — T-HELPER CELLS (CD4) COUNT (NOT AT ARMC)
Absolute CD4: 493 {cells}/uL (ref 490–1740)
CD4 T Helper %: 29 % — ABNORMAL LOW (ref 30–61)
Total lymphocyte count: 1684 {cells}/uL (ref 850–3900)

## 2024-03-29 LAB — RPR: RPR Ser Ql: NONREACTIVE

## 2024-03-31 LAB — CYTOLOGY - NON PAP

## 2024-04-04 ENCOUNTER — Other Ambulatory Visit: Payer: Self-pay | Admitting: Internal Medicine

## 2024-04-04 DIAGNOSIS — B009 Herpesviral infection, unspecified: Secondary | ICD-10-CM

## 2024-04-05 ENCOUNTER — Other Ambulatory Visit: Payer: Self-pay

## 2024-04-05 DIAGNOSIS — B20 Human immunodeficiency virus [HIV] disease: Secondary | ICD-10-CM

## 2024-04-05 MED ORDER — BIKTARVY 50-200-25 MG PO TABS: 1.0000 | ORAL_TABLET | Freq: Every day | ORAL | 4 refills | Status: DC

## 2024-04-07 ENCOUNTER — Other Ambulatory Visit: Payer: Self-pay | Admitting: Student

## 2024-04-07 DIAGNOSIS — B20 Human immunodeficiency virus [HIV] disease: Secondary | ICD-10-CM

## 2024-04-07 NOTE — Telephone Encounter (Signed)
 Per chart review, this medication was refilled and sent to the [facility] 04/05/2024. RN called pt to inform him. Pt verbalized understanding.

## 2024-04-07 NOTE — Telephone Encounter (Signed)
 Copied from CRM [id number]. Topic: Clinical - Medication Refill >> Apr 07, 2024  8:53 AM Lenon Radar A wrote: Medication: bictegravir-emtricitabine -tenofovir  AF (BIKTARVY ) 50-200-25 MG TABS tablet  Has the patient contacted their pharmacy? Yes (Agent: If no, request that the patient contact the pharmacy for the refill. If patient does not wish to contact the pharmacy document the reason why and proceed with request.) (Agent: If yes, when and what did the pharmacy advise?)  Patient was advised that there are no refills available.   This is the patient's preferred pharmacy:  [facility] - [place], [place] - [address] [place] [address] Phone: [phone] Fax: [phone]  Is this the correct pharmacy for this prescription? Yes If no, delete pharmacy and type the correct one.   Has the prescription been filled recently? No  Is the patient out of the medication? Yes  Has the patient been seen for an appointment in the last year OR does the patient have an upcoming appointment? Yes  Can we respond through MyChart? Yes  Agent: Please be advised that Rx refills may take up to 3 business days. We ask that you follow-up with your pharmacy.

## 2024-04-15 ENCOUNTER — Encounter: Payer: Self-pay | Admitting: *Deleted

## 2024-06-17 ENCOUNTER — Other Ambulatory Visit: Payer: Self-pay | Admitting: Internal Medicine

## 2024-06-17 DIAGNOSIS — B20 Human immunodeficiency virus [HIV] disease: Secondary | ICD-10-CM

## 2024-06-20 ENCOUNTER — Ambulatory Visit: Admitting: Internal Medicine

## 2024-07-14 ENCOUNTER — Ambulatory Visit (INDEPENDENT_AMBULATORY_CARE_PROVIDER_SITE_OTHER): Admitting: Internal Medicine

## 2024-07-14 ENCOUNTER — Ambulatory Visit: Admitting: Internal Medicine

## 2024-07-14 ENCOUNTER — Other Ambulatory Visit: Payer: Self-pay

## 2024-07-14 ENCOUNTER — Encounter: Payer: Self-pay | Admitting: Internal Medicine

## 2024-07-14 ENCOUNTER — Other Ambulatory Visit (HOSPITAL_COMMUNITY)
Admission: RE | Admit: 2024-07-14 | Discharge: 2024-07-14 | Disposition: A | Discharge status: Home / Self Care | Source: Ambulatory Visit | Attending: Internal Medicine | Admitting: Internal Medicine

## 2024-07-14 VITALS — BP 144/105 | HR 80 | Temp 98.5°F | Resp 16 | Wt 199.2 lb

## 2024-07-14 DIAGNOSIS — B20 Human immunodeficiency virus [HIV] disease: Secondary | ICD-10-CM | POA: Diagnosis present

## 2024-07-14 DIAGNOSIS — Z23 Encounter for immunization: Secondary | ICD-10-CM

## 2024-07-14 DIAGNOSIS — Z789 Other specified health status: Secondary | ICD-10-CM | POA: Diagnosis not present

## 2024-07-14 DIAGNOSIS — Z79899 Other long term (current) drug therapy: Secondary | ICD-10-CM

## 2024-07-14 MED ORDER — BIKTARVY 50-200-25 MG PO TABS: 1.0000 | ORAL_TABLET | Freq: Every day | ORAL | 3 refills | Status: AC

## 2024-07-14 NOTE — Progress Notes (Signed)
 RFV: follow up for hiv disease  [name], male   DOB: [birth year], 58 y.o.   MRN: [id number]  HPI Colin Alexander is a 58yo M with well controlled hiv disease, cd 4 count of 493/VL 38; continues on biktarvy  Mother now has dementia  married now to andrew cuomo . He did get a chance to go see her last month. Starting back at work-  No recent health concerns  Outpatient Encounter Medications as of 07/14/2024  Medication Sig   amLODipine  (NORVASC ) 10 MG tablet TAKE 1 TABLET(10 MG) BY MOUTH DAILY   BIKTARVY  50-200-25 MG TABS tablet TAKE 1 TABLET BY MOUTH DAILY.   lisinopril  (ZESTRIL ) 20 MG tablet TAKE 1 TABLET(20 MG) BY MOUTH DAILY   rosuvastatin  (CRESTOR ) 5 MG tablet TAKE 1 TABLET(5 MG) BY MOUTH DAILY   tamsulosin  (FLOMAX ) 0.4 MG CAPS capsule Take 1 capsule (0.4 mg total) by mouth daily after supper.   valACYclovir  (VALTREX ) 1000 MG tablet TAKE 1 TABLET BY MOUTH DAILY   Facility-Administered Encounter Medications as of 07/14/2024  Medication   0.9 %  sodium chloride  infusion     Patient Active Problem List   Diagnosis Date Noted   History of kidney stones 02/22/2024   Venous stasis 02/22/2024   Bilateral leg edema 06/10/2023   Lightheadedness 04/30/2023   Right knee injury, initial encounter 02/24/2023   Rhinosinusitis 07/22/2022   Chronic left hip pain 06/11/2022   Healthcare maintenance 12/13/2021   Family history of premature CAD 10/18/2012   HSV (herpes simplex virus) anogenital infection 06/07/2012   HIV (human immunodeficiency virus infection) (HCC) 01/13/2012   HTN (hypertension) 12/31/2011     Health Maintenance Due  Topic Date Due   Hepatitis B Vaccines 19-59 Average Risk (1 of 3 - 19+ 3-dose series) Never done   Zoster Vaccines- Shingrix  (2 of 2) 05/23/2024   INFLUENZA VACCINE  06/10/2024   COVID-19 Vaccine (4 - Pfizer risk 2024-25 season) 07/11/2024     Review of Systems Review of Systems  Constitutional: Negative for fever, chills, diaphoresis,  activity change, appetite change, fatigue and unexpected weight change.  HENT: Negative for congestion, sore throat, rhinorrhea, sneezing, trouble swallowing and sinus pressure.  Eyes: Negative for photophobia and visual disturbance.  Respiratory: Negative for cough, chest tightness, shortness of breath, wheezing and stridor.  Cardiovascular: Negative for chest pain, palpitations and leg swelling.  Gastrointestinal: Negative for nausea, vomiting, abdominal pain, diarrhea, constipation, blood in stool, abdominal distention and anal bleeding.  Genitourinary: Negative for dysuria, hematuria, flank pain and difficulty urinating.  Musculoskeletal: Negative for myalgias, back pain, joint swelling, arthralgias and gait problem.  Skin: Negative for color change, pallor, rash and wound.  Neurological: Negative for dizziness, tremors, weakness and light-headedness.  Hematological: Negative for adenopathy. Does not bruise/bleed easily.  Psychiatric/Behavioral: Negative for behavioral problems, confusion, sleep disturbance, dysphoric mood, decreased concentration and agitation.   Physical Exam   BP (!) 144/105   Pulse 80   Temp 98.5 F (36.9 C) (Oral)   Resp 16   Wt 199 lb 3.2 oz (90.4 kg)   SpO2 95%   BMI 30.29 kg/m  Physical Exam  Constitutional: He is oriented to person, place, and time. He appears well-developed and well-nourished. No distress.  HENT:  Mouth/Throat: Oropharynx is clear and moist. No oropharyngeal exudate.  Cardiovascular: Normal rate, regular rhythm and normal heart sounds. Exam reveals no gallop and no friction rub.  No murmur heard.  Pulmonary/Chest: Effort normal and breath sounds normal. No respiratory distress. He  has no wheezes.  Abdominal: Soft. Bowel sounds are normal. He exhibits no distension. There is no tenderness.  Lymphadenopathy:  He has no cervical adenopathy.  Neurological: He is alert and oriented to person, place, and time.  Skin: Skin is warm and dry.  No rash noted. No erythema.  Psychiatric: He has a normal mood and affect. His behavior is normal.   Lab Results  Component Value Date   CD4TCELL 29 (L) 03/28/2024   Lab Results  Component Value Date   CD4TABS 276 (L) 12/16/2023   CD4TABS 362 (L) 09/16/2023   CD4TABS 415 12/22/2022   Lab Results  Component Value Date   HIV1RNAQUANT 26 (H) 12/16/2023   Lab Results  Component Value Date   HEPBSAB POS (A) 01/17/2014   Lab Results  Component Value Date   LABRPR NON-REACTIVE 03/28/2024    CBC Lab Results  Component Value Date   WBC 4.4 12/16/2023   RBC 4.73 12/16/2023   HGB 15.8 12/16/2023   HCT 47.7 12/16/2023   PLT 253 12/16/2023   MCV 100.8 (H) 12/16/2023   MCH 33.4 (H) 12/16/2023   MCHC 33.1 12/16/2023   RDW 11.4 12/16/2023   LYMPHSABS 1,736 12/22/2022   MONOABS 0.5 09/05/2019   EOSABS 132 12/16/2023    BMET Lab Results  Component Value Date   NA 142 12/16/2023   K 4.0 12/16/2023   CL 103 12/16/2023   CO2 31 12/16/2023   GLUCOSE 90 12/16/2023   BUN 11 12/16/2023   CREATININE 0.93 12/16/2023   CALCIUM  9.8 12/16/2023   GFRNONAA 86 09/05/2020   GFRAA 100 09/05/2020      Assessment and Plan HIV Disease= will check labs to see still is undetectable and CD 4 count is back at baseline; will give refills  Long term medication management = will check cr  Health maintenance = will give flu vaccine; will check rpr; and lipids. And redo anal pap

## 2024-07-15 LAB — T-HELPER CELL (CD4) - (RCID CLINIC ONLY)
CD4 % Helper T Cell: 31 % — ABNORMAL LOW (ref 33–65)
CD4 T Cell Abs: 459 /uL (ref 400–1790)

## 2024-07-16 LAB — COMPLETE METABOLIC PANEL WITHOUT GFR
AG Ratio: 1.7 (calc) (ref 1.0–2.5)
ALT: 26 U/L (ref 9–46)
AST: 16 U/L (ref 10–35)
Albumin: 4.7 g/dL (ref 3.6–5.1)
Alkaline phosphatase (APISO): 44 U/L (ref 35–144)
BUN: 9 mg/dL (ref 7–25)
CO2: 30 mmol/L (ref 20–32)
Calcium: 9.6 mg/dL (ref 8.6–10.3)
Chloride: 103 mmol/L (ref 98–110)
Creat: 0.85 mg/dL (ref 0.70–1.30)
Globulin: 2.8 g/dL (ref 1.9–3.7)
Glucose, Bld: 90 mg/dL (ref 65–99)
Potassium: 3.8 mmol/L (ref 3.5–5.3)
Sodium: 141 mmol/L (ref 135–146)
Total Bilirubin: 0.8 mg/dL (ref 0.2–1.2)
Total Protein: 7.5 g/dL (ref 6.1–8.1)

## 2024-07-16 LAB — HIV-1 RNA QUANT-NO REFLEX-BLD
HIV 1 RNA Quant: <20 {copies}/mL — AB
HIV-1 RNA Quant, Log: <1.3 {Log_copies}/mL — AB

## 2024-07-16 LAB — CBC WITH DIFFERENTIAL/PLATELET
Absolute Lymphocytes: 1714 {cells}/uL (ref 850–3900)
Absolute Monocytes: 473 {cells}/uL (ref 200–950)
Basophils Absolute: 101 {cells}/uL (ref 0–200)
Basophils Relative: 1.6 %
Eosinophils Absolute: 309 {cells}/uL (ref 15–500)
Eosinophils Relative: 4.9 %
HCT: 45.6 % (ref 38.5–50.0)
Hemoglobin: 15.6 g/dL (ref 13.2–17.1)
MCH: 34.3 pg — ABNORMAL HIGH (ref 27.0–33.0)
MCHC: 34.2 g/dL (ref 32.0–36.0)
MCV: 100.2 fL — ABNORMAL HIGH (ref 80.0–100.0)
MPV: 10.7 fL (ref 7.5–12.5)
Monocytes Relative: 7.5 %
Neutro Abs: 3704 {cells}/uL (ref 1500–7800)
Neutrophils Relative %: 58.8 %
Platelets: 249 Thousand/uL (ref 140–400)
RBC: 4.55 Million/uL (ref 4.20–5.80)
RDW: 11.6 % (ref 11.0–15.0)
Total Lymphocyte: 27.2 %
WBC: 6.3 Thousand/uL (ref 3.8–10.8)

## 2024-07-16 LAB — LIPID PANEL
Cholesterol: 100 mg/dL (ref <200)
HDL: 37 mg/dL — ABNORMAL LOW (ref >=40)
LDL Cholesterol (Calc): 47 mg/dL
Non-HDL Cholesterol (Calc): 63 mg/dL (ref <130)
Total CHOL/HDL Ratio: 2.7 (calc) (ref <5.0)
Triglycerides: 79 mg/dL (ref <150)

## 2024-07-16 LAB — RPR: RPR Ser Ql: NONREACTIVE

## 2024-07-21 LAB — CYTOLOGY - PAP: Diagnosis: NEGATIVE

## 2024-09-05 ENCOUNTER — Emergency Department (HOSPITAL_BASED_OUTPATIENT_CLINIC_OR_DEPARTMENT_OTHER)
Admission: EM | Admit: 2024-09-05 | Discharge: 2024-09-05 | Disposition: A | Discharge status: Home / Self Care | Attending: Emergency Medicine | Admitting: Emergency Medicine

## 2024-09-05 ENCOUNTER — Emergency Department (HOSPITAL_BASED_OUTPATIENT_CLINIC_OR_DEPARTMENT_OTHER): Admitting: Radiology

## 2024-09-05 ENCOUNTER — Encounter (HOSPITAL_BASED_OUTPATIENT_CLINIC_OR_DEPARTMENT_OTHER): Payer: Self-pay

## 2024-09-05 ENCOUNTER — Other Ambulatory Visit: Payer: Self-pay

## 2024-09-05 DIAGNOSIS — M25511 Pain in right shoulder: Secondary | ICD-10-CM | POA: Insufficient documentation

## 2024-09-05 DIAGNOSIS — M79604 Pain in right leg: Secondary | ICD-10-CM | POA: Diagnosis not present

## 2024-09-05 DIAGNOSIS — M25512 Pain in left shoulder: Secondary | ICD-10-CM | POA: Insufficient documentation

## 2024-09-05 DIAGNOSIS — M545 Low back pain, unspecified: Secondary | ICD-10-CM | POA: Diagnosis present

## 2024-09-05 DIAGNOSIS — Y92410 Unspecified street and highway as the place of occurrence of the external cause: Secondary | ICD-10-CM | POA: Diagnosis not present

## 2024-09-05 MED ORDER — LIDOCAINE 5 % EX PTCH: 1.0000 | MEDICATED_PATCH | CUTANEOUS | 0 refills | Status: DC

## 2024-09-05 MED ORDER — LIDOCAINE 5 % EX PTCH
1.0000 | MEDICATED_PATCH | CUTANEOUS | Status: DC
  Administered 2024-09-05: 1 via TRANSDERMAL
  Filled 2024-09-05: qty 1

## 2024-09-05 MED ORDER — IBUPROFEN 800 MG PO TABS
800.0000 mg | ORAL_TABLET | Freq: Once | ORAL | Status: AC
  Administered 2024-09-05: 800 mg via ORAL
  Filled 2024-09-05: qty 1

## 2024-09-05 NOTE — Discharge Instructions (Signed)
 Evaluation was reassuring.  As we discussed recommend applying ice in areas that hurt the next 3 days.  I would ice 4 times a day.  You can also take ibuprofen  and Tylenol  for pain.  Please follow-up with her PCP.  If your symptoms worsen or change anyway please return to the ED for further evaluation.
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
## 2024-09-19 ENCOUNTER — Telehealth: Payer: Self-pay

## 2024-09-19 NOTE — Telephone Encounter (Signed)
 Doug called, states he is getting ready to travel and wanted to make sure he had refills on file for all of his medications. States he was not able to get amlodipine  because the pharmacy told him the Rx was expired. Discussed that this is being managed by IM. Notified him that lisinopril  is due for refills and encouraged him to reach out to IM to ensure all anti-hypertensives are being managed by the same provider.   Ensured that he has refills of Biktarvy  available.   Kainen Struckman, BSN, RN

## 2024-09-20 NOTE — Patient Instructions (Incomplete)
 Thank you, [name] for allowing us  to provide your care today. Today we discussed your need for medication refills.  STOP taking Lisinopril  20mg , as I am going to switch you to a new medicine listed below.  Referrals: -Referral to urology to check on your kidney stones and urinary symptoms -Physical therapy for your back pain, as this may be piriformis syndrome  Medications: -Amlodipine  10mg , once daily for your blood pressure -Olmesartan 40mg , once daily for your blood pressure -Rosuvastatin  5mg , once daily for your cholesterol -Lidocaine patches 5% for your back pain -Gabapentin 100mg , up to three times a day as needed for your back pain    My Chart Access: [email or web address]  Please follow-up in: 1 month to check your kidney function    We look forward to seeing you next time. Please call our clinic at [phone] if you have any questions or concerns. The best time to call is Monday-Friday from 9am-4pm, but there is someone available 24/7. If after hours or the weekend, call the main hospital number and ask for the Internal Medicine Resident On-Call. If you need medication refills, please notify your pharmacy one week in advance and they will send us  a request.   Thank you for letting us  take part in your care. Wishing you the best!  Odai Wimmer, DO 09/21/2024, 4:03 PM [facility]

## 2024-09-20 NOTE — Progress Notes (Unsigned)
   Established Patient Office Visit  Subjective   [name], male    DOB: 14-Mar-1966  Age: 58 y.o. MRN: [id number]  No chief complaint on file.   Patient is a 58 year old male with a past medical history of hypertension, HIV on Biktarvy  who presents today for medication refills. Please see problem-based assessment and plan below for details.    ROS    Objective:    There were no vitals taken for this visit. Physical Exam   No results found for any visits on 09/21/24.   The ASCVD Risk score (Arnett DK, et al., 2019) failed to calculate for the following reasons:   The valid total cholesterol range is 130 to 320 mg/dL    Assessment & Plan:   Patient seen with {IMTSattending2025/2026:32924}.  Problem List Items Addressed This Visit   None   No follow-ups on file.   Culver Feighner, DO Internal Medicine Resident, PGY-1 11:10 AM 09/20/2024

## 2024-09-21 ENCOUNTER — Ambulatory Visit: Payer: Self-pay

## 2024-09-21 VITALS — BP 127/89 | HR 72 | Temp 97.6°F | Ht 68.0 in | Wt 203.8 lb

## 2024-09-21 DIAGNOSIS — Z87442 Personal history of urinary calculi: Secondary | ICD-10-CM | POA: Diagnosis not present

## 2024-09-21 DIAGNOSIS — I10 Essential (primary) hypertension: Secondary | ICD-10-CM | POA: Diagnosis not present

## 2024-09-21 DIAGNOSIS — G5703 Lesion of sciatic nerve, bilateral lower limbs: Secondary | ICD-10-CM | POA: Insufficient documentation

## 2024-09-21 DIAGNOSIS — M792 Neuralgia and neuritis, unspecified: Secondary | ICD-10-CM | POA: Insufficient documentation

## 2024-09-21 DIAGNOSIS — E785 Hyperlipidemia, unspecified: Secondary | ICD-10-CM | POA: Diagnosis not present

## 2024-09-21 MED ORDER — ROSUVASTATIN CALCIUM 5 MG PO TABS: 5.0000 mg | ORAL_TABLET | Freq: Every day | ORAL | 11 refills | Status: AC

## 2024-09-21 MED ORDER — AMLODIPINE BESYLATE 10 MG PO TABS: ORAL_TABLET | ORAL | 3 refills | Status: DC

## 2024-09-21 MED ORDER — GABAPENTIN 100 MG PO CAPS: 100.0000 mg | ORAL_CAPSULE | Freq: Three times a day (TID) | ORAL | 0 refills | Status: AC

## 2024-09-21 MED ORDER — LIDOCAINE 5 % EX PTCH: 1.0000 | MEDICATED_PATCH | CUTANEOUS | 0 refills | Status: AC
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
## 2024-09-21 NOTE — Assessment & Plan Note (Addendum)
 Patient has history of kidney stones earlier in April. He notes some urinary changes with increased nocturia and frequency, but no noted CVA tenderness or hematuria. He has been staying hydrated, as he has experienced kidney stone pain before, but does not think that his pain is similar to the kidney stone pain he had in April. However, he is concerned about his urinary frequency and his nocturia. Has trialed Tamsulosin  before for frequency, but did not note this to help him much. No dysuria, difficulties voiding, or dribbling. PSA done in 12/2023 was 1.13. He would like to be referred to urology to discuss his urinary symptoms and assess for his recurrent kidney stones. Will send referral today.   -Referral to urology

## 2024-09-21 NOTE — Assessment & Plan Note (Addendum)
 BP today 140/100, repeat 127/89. Current regimen includes Amlodipine  10mg  and Lisinopril  20mg  daily. Patient had been getting refills from [facility] for a few months but needs to continue prescriptions with [facility]. Discussed how DBP is still elevated on recheck, will stop Lisinopril  20mg  and switch Olmesartan as well as increasing the medication to 40mg .   -Continue Amlodipine  10mg  daily -Stop Lisinopril  20mg   -Start Oimesartan 40mg  once daily -If persistently elevated 1 month more, consider uptitrating Olmesartan -Check BMP in 1 month to monitor renal function

## 2024-09-21 NOTE — Assessment & Plan Note (Signed)
 Secondary to piriformis syndrome, please see assessment and plan above. Will send lidocaine patches and gabapentin 100mg  TID PRN.

## 2024-09-21 NOTE — Assessment & Plan Note (Signed)
 Chronic, patient on Crestor  5mg  tablet. Last lipid panel 07/2024 shows LDL 47 and total cholesterol 100. Will continue statin therapy  -Crestor  5mg  daily

## 2024-09-22 ENCOUNTER — Telehealth: Payer: Self-pay

## 2024-09-22 NOTE — Progress Notes (Signed)
 Internal Medicine Clinic Attending  I was physically present during the key portions of the resident provided service and participated in the medical decision making of patient's management care. I reviewed pertinent patient test results.  The assessment, diagnosis, and plan were formulated together and I agree with the documentation in the resident's note.  Shawn Sick, MD

## 2024-09-22 NOTE — Telephone Encounter (Signed)
 Prior Authorization for patient (Lidoderm 5% patches) came through on cover my meds was submitted with last office notes and labs awaiting approval or denial.  KEY:[id number]

## 2024-09-22 NOTE — Telephone Encounter (Signed)
 [facility] (Key: [id number]) PA Case ID #: [id number] Need Help? Call us  at [phone] Outcome Approved today by [facility] 2017 [employer] Your PA request has been approved. Additional information will be provided in the approval communication. (Message 1145) Effective Date: 09/22/2024 Authorization Expiration Date: 09/22/2027 Drug Lidoderm 5% patches ePA cloud logo Form [employer] Electronic PA Form [phone] NCPDP)  Patient is aware of approval

## 2024-09-29 ENCOUNTER — Other Ambulatory Visit: Payer: Self-pay | Admitting: Internal Medicine

## 2024-09-29 NOTE — Telephone Encounter (Signed)
 PCP discontinued lisinopril . HTN is being managed by PCP  Amilibia, Jaden, DO     PCP - General    Since 04/25/2024    [phone]   Duwaine Lowe, BSN, RN

## 2024-10-13 ENCOUNTER — Ambulatory Visit: Admitting: Internal Medicine

## 2024-10-13 ENCOUNTER — Other Ambulatory Visit: Payer: Self-pay

## 2024-10-13 VITALS — BP 146/95 | HR 82 | Temp 99.0°F | Wt 203.6 lb

## 2024-10-13 DIAGNOSIS — B009 Herpesviral infection, unspecified: Secondary | ICD-10-CM

## 2024-10-13 DIAGNOSIS — B20 Human immunodeficiency virus [HIV] disease: Secondary | ICD-10-CM

## 2024-10-13 DIAGNOSIS — I10 Essential (primary) hypertension: Secondary | ICD-10-CM | POA: Diagnosis not present

## 2024-10-13 DIAGNOSIS — Z79899 Other long term (current) drug therapy: Secondary | ICD-10-CM

## 2024-10-13 NOTE — Progress Notes (Unsigned)
 [name], male   DOB: 1966/08/10, 58 y.o.   MRN: [id number]  HPI Colin Alexander with hiv disease, CD 4 count 459/VL<20 in sep 2025, on biktarvy , no missing doses  Recently had BP meds change with some improvement  Not sexually active  Going to [place] to for the holiday  Outpatient Encounter Medications as of 10/13/2024  Medication Sig   amLODipine  (NORVASC ) 10 MG tablet TAKE 1 TABLET(10 MG) BY MOUTH DAILY   bictegravir-emtricitabine -tenofovir  AF (BIKTARVY ) 50-200-25 MG TABS tablet Take 1 tablet by mouth daily.   gabapentin  (NEURONTIN ) 100 MG capsule Take 1 capsule (100 mg total) by mouth 3 (three) times daily.   rosuvastatin  (CRESTOR ) 5 MG tablet Take 1 tablet (5 mg total) by mouth daily.   valACYclovir  (VALTREX ) 1000 MG tablet TAKE 1 TABLET BY MOUTH DAILY   lisinopril  (ZESTRIL ) 20 MG tablet TAKE 1 TABLET(20 MG) BY MOUTH DAILY (Patient not taking: Reported on 10/13/2024)   Facility-Administered Encounter Medications as of 10/13/2024  Medication   0.9 %  sodium chloride  infusion     Patient Active Problem List   Diagnosis Date Noted   Hyperlipidemia 09/21/2024   Piriformis syndrome of both sides 09/21/2024   Neuropathic pain 09/21/2024   History of kidney stones 02/22/2024   Venous stasis 02/22/2024   Bilateral leg edema 06/10/2023   Lightheadedness 04/30/2023   Right knee injury, initial encounter 02/24/2023   Rhinosinusitis 07/22/2022   Chronic left hip pain 06/11/2022   Healthcare maintenance 12/13/2021   Family history of premature CAD 10/18/2012   HSV (herpes simplex virus) anogenital infection 06/07/2012   HIV (human immunodeficiency virus infection) (HCC) 01/13/2012   HTN (hypertension) 12/31/2011     Health Maintenance Due  Topic Date Due   Hepatitis B Vaccines 19-59 Average Risk (1 of 3 - 19+ 3-dose series) Never done   Zoster Vaccines- Shingrix  (2 of 2) 05/23/2024   COVID-19 Vaccine (4 - 2025-26 season) 07/11/2024     Review of  Systems  Physical Exam   BP (!) 146/95   Pulse 82   Temp 99 F (37.2 C) (Oral)   Wt 203 lb 9.6 oz (92.4 kg)   SpO2 96%   BMI 30.96 kg/Alexander    Lab Results  Component Value Date   CD4TCELL 31 (L) 07/14/2024   Lab Results  Component Value Date   CD4TABS 459 07/14/2024   CD4TABS 276 (L) 12/16/2023   CD4TABS 362 (L) 09/16/2023   Lab Results  Component Value Date   HIV1RNAQUANT <20 DETECTED (A) 07/14/2024   Lab Results  Component Value Date   HEPBSAB POS (A) 01/17/2014   Lab Results  Component Value Date   LABRPR NON-REACTIVE 07/14/2024    CBC Lab Results  Component Value Date   WBC 6.3 07/14/2024   RBC 4.55 07/14/2024   HGB 15.6 07/14/2024   HCT 45.6 07/14/2024   PLT 249 07/14/2024   MCV 100.2 (H) 07/14/2024   MCH 34.3 (H) 07/14/2024   MCHC 34.2 07/14/2024   RDW 11.6 07/14/2024   LYMPHSABS 1,736 12/22/2022   MONOABS 0.5 09/05/2019   EOSABS 309 07/14/2024    BMET Lab Results  Component Value Date   NA 141 07/14/2024   K 3.8 07/14/2024   CL 103 07/14/2024   CO2 30 07/14/2024   GLUCOSE 90 07/14/2024   BUN 9 07/14/2024   CREATININE 0.85 07/14/2024   CALCIUM  9.6 07/14/2024   GFRNONAA 86 09/05/2020   GFRAA 100 09/05/2020  Assessment and Plan  Hiv Htn Uptodate on vaccine

## 2024-10-24 ENCOUNTER — Ambulatory Visit: Admitting: Student

## 2024-10-24 VITALS — BP 114/80 | HR 87 | Temp 97.8°F | Ht 68.0 in | Wt 207.8 lb

## 2024-10-24 DIAGNOSIS — Z7185 Encounter for immunization safety counseling: Secondary | ICD-10-CM | POA: Diagnosis not present

## 2024-10-24 DIAGNOSIS — I10 Essential (primary) hypertension: Secondary | ICD-10-CM | POA: Diagnosis not present

## 2024-10-24 MED ORDER — AMLODIPINE-OLMESARTAN 10-40 MG PO TABS: 1.0000 | ORAL_TABLET | Freq: Every day | ORAL | 3 refills | Status: AC

## 2024-10-24 NOTE — Patient Instructions (Signed)
 Thank you, Colin Alexander for allowing us  to provide your care today.  Great job with your blood pressure!  -We will combine your blood pressure medication pills, amlodipine  and olmesartan , into one pill. This will replace both of the individual medicines. Take combo pill, and do not take individual amlodipine  and olmesartan  pills.   -Blood work today, I will call with results.   I have ordered the following labs for you:  Lab Orders         Basic metabolic panel with GFR      I have ordered the following medication/changed the following medications:   Stop the following medications: Medications Discontinued During This Encounter  Medication Reason   lisinopril  (ZESTRIL ) 20 MG tablet    amLODipine  (NORVASC ) 10 MG tablet      Start the following medications: Meds ordered this encounter  Medications   amLODipine -olmesartan  (AZOR ) 10-40 MG tablet    Sig: Take 1 tablet by mouth daily.    Dispense:  90 tablet    Refill:  3     Follow up: 3 months    Should you have any questions or concerns please call the internal medicine clinic at [phone].     [facility]

## 2024-10-24 NOTE — Progress Notes (Unsigned)
 This is a Psychologist, Occupational Note.  The care of the patient was discussed with Dr. Jolaine  and the assessment and plan was formulated with their assistance.  Please see their note for official documentation of the patient encounter.   Subjective:   [name] male   DOB: 1966-01-23 58 y.o.   MRN: [id number]  HPI: ColinTrevor D Alexander is a 58 y.o. male with past medical history of HTN, HLD, and HIV who presents to the clinic for follow up visit for HTN management. Patient has no acute concerns. Reports ocassional lightheadedness which he contributes to dehydration. No LOC.     Past Medical History:  Diagnosis Date   Anogenital herpes simplex virus (HSV) infection    Ear pain, bilateral 09/23/2022   Flu-like symptoms 11/12/2022   Cough on New year's eve. Worsened to include green phlgem, dizziness, chills. No termometer but feels like he has a fever. Taking coughing medicine.  No sore throat, has headaches, no sinus pains, has myalgias, coughing not worsening but not getting better, before dark green then light green. Had flu vaccine here. Had covid vaccine with booster, no RSV vaccine. Having intermittant ear pain. Everyo   HIV (human immunodeficiency virus infection) (HCC)    Hypertension    Current Outpatient Medications  Medication Sig Dispense Refill   amLODipine -olmesartan  (AZOR ) 10-40 MG tablet Take 1 tablet by mouth daily. 90 tablet 3   bictegravir-emtricitabine -tenofovir  AF (BIKTARVY ) 50-200-25 MG TABS tablet Take 1 tablet by mouth daily. 90 tablet 3   gabapentin  (NEURONTIN ) 100 MG capsule Take 1 capsule (100 mg total) by mouth 3 (three) times daily. 90 capsule 0   rosuvastatin  (CRESTOR ) 5 MG tablet Take 1 tablet (5 mg total) by mouth daily. 30 tablet 11   valACYclovir  (VALTREX ) 1000 MG tablet TAKE 1 TABLET BY MOUTH DAILY 30 tablet 5   Current Facility-Administered Medications  Medication Dose Route Frequency Provider Last Rate Last Admin   0.9 %  sodium chloride   infusion  500 mL Intravenous Continuous Nandigam, Kavitha V, MD       Family History  Problem Relation Age of Onset   Heart attack Mother        Stent placement   Hypertension Mother    Colon polyps Father    Heart attack Father        CABG   Hypertension Father    Heart attack Cousin        In his 29's deceased.   Colon cancer Neg Hx    Esophageal cancer Neg Hx    Rectal cancer Neg Hx    Stomach cancer Neg Hx    Social History   Socioeconomic History   Marital status: Divorced    Spouse name: Not on file   Number of children: Not on file   Years of education: Not on file   Highest education level: Not on file  Occupational History   Not on file  Tobacco Use   Smoking status: Never   Smokeless tobacco: Never  Vaping Use   Vaping status: Never Used  Substance and Sexual Activity   Alcohol use: No    Alcohol/week: 0.0 standard drinks of alcohol   Drug use: No   Sexual activity: Yes    Birth control/protection: Condom    Comment: declined condoms  Other Topics Concern   Not on file  Social History Narrative   Not on file   Social Drivers of Health   Tobacco Use: Low Risk (09/05/2024)  Patient History    Smoking Tobacco Use: Never    Smokeless Tobacco Use: Never    Passive Exposure: Not on file  Financial Resource Strain: Low Risk (10/24/2024)   Overall Financial Resource Strain (CARDIA)    Difficulty of Paying Living Expenses: Not very hard  Food Insecurity: No Food Insecurity (10/24/2024)   Epic    Worried About Programme Researcher, Broadcasting/film/video in the Last Year: Never true    Ran Out of Food in the Last Year: Never true  Transportation Needs: No Transportation Needs (10/24/2024)   Epic    Lack of Transportation (Medical): No    Lack of Transportation (Non-Medical): No  Physical Activity: Insufficiently Active (10/24/2024)   Exercise Vital Sign    Days of Exercise per Week: 2 days    Minutes of Exercise per Session: 20 min  Stress: No Stress Concern Present  (10/24/2024)   [employer] of Occupational Health - Occupational Stress Questionnaire    Feeling of Stress: Only a little  Social Connections: Moderately Integrated (10/24/2024)   Social Connection and Isolation Panel    Frequency of Communication with Friends and Family: More than three times a week    Frequency of Social Gatherings with Friends and Family: More than three times a week    Attends Religious Services: More than 4 times per year    Active Member of Clubs or Organizations: Yes    Attends Banker Meetings: More than 4 times per year    Marital Status: Divorced  Depression (PHQ2-9): Low Risk (10/24/2024)   Depression (PHQ2-9)    PHQ-2 Score: 0  Alcohol Screen: Low Risk (02/24/2023)   Alcohol Screen    Last Alcohol Screening Score (AUDIT): 0  Housing: Unknown (10/24/2024)   Epic    Unable to Pay for Housing in the Last Year: No    Number of Times Moved in the Last Year: Not on file    Homeless in the Last Year: No  Utilities: Not At Risk (10/24/2024)   Epic    Threatened with loss of utilities: No  Health Literacy: Adequate Health Literacy (10/24/2024)   B1300 Health Literacy    Frequency of need for help with medical instructions: Never   Review of Systems: Pertinent items are noted in HPI. Objective:  Physical Exam: Vitals:   10/24/24 1537  BP: 114/80  Pulse: 87  Temp: 97.8 F (36.6 C)  TempSrc: Oral  SpO2: 93%  Weight: 207 lb 12.8 oz (94.3 kg)  Height: 5' 8 (1.727 m)   BP 114/80 (BP Location: Left Arm, Patient Position: Sitting, Cuff Size: Normal)   Pulse 87   Temp 97.8 F (36.6 C) (Oral)   Ht 5' 8 (1.727 m)   Wt 207 lb 12.8 oz (94.3 kg)   SpO2 93%   BMI 31.60 kg/m   General Appearance:    Alert, cooperative, no distress, appears stated age  Head:    Normocephalic, without obvious abnormality, atraumatic  Lungs:     Clear to auscultation bilaterally, respirations unlabored  Heart:    Regular rate and rhythm, S1 and S2 normal,  no murmur, rub   or gallop   Assessment & Plan:   Assessment & Plan Primary hypertension Well controlled. BP today 114/80. Currently on amlodipine  10 mg and olmesartan  40 mg. BMP pending.   Plan: -combine amlodipine  and olmesartan  into one pill to reduce med burden  -follow up on BMP for electrolyte and kidney assessment    Orders:   Basic metabolic  panel with GFR   amLODipine -olmesartan  (AZOR ) 10-40 MG tablet; Take 1 tablet by mouth daily.  Vaccine counseling Patient will plan to finish Zoster vaccine series at local pharmacy.      Discussed and evaluated with Dr. Jolaine and discussed plan with Dr. Lovie.    Cozetta Pereyra, MS3

## 2024-10-24 NOTE — Assessment & Plan Note (Signed)
 Well controlled. BP today 114/80. Currently on amlodipine  10 mg and olmesartan  40 mg. BMP pending.   Plan: -combine amlodipine  and olmesartan  into one pill to reduce med burden  -follow up on BMP for electrolyte and kidney assessment    Orders:   Basic metabolic panel with GFR   amLODipine -olmesartan  (AZOR ) 10-40 MG tablet; Take 1 tablet by mouth daily.

## 2024-10-25 LAB — BASIC METABOLIC PANEL WITH GFR
BUN/Creatinine Ratio: 10 (ref 9–20)
BUN: 9 mg/dL (ref 6–24)
CO2: 24 mmol/L (ref 20–29)
Calcium: 9.6 mg/dL (ref 8.7–10.2)
Chloride: 100 mmol/L (ref 96–106)
Creatinine, Ser: 0.89 mg/dL (ref 0.76–1.27)
Glucose: 94 mg/dL (ref 70–99)
Potassium: 3.5 mmol/L (ref 3.5–5.2)
Sodium: 141 mmol/L (ref 134–144)
eGFR: 99 mL/min/1.73 (ref >59)

## 2024-10-26 ENCOUNTER — Other Ambulatory Visit: Payer: Self-pay | Admitting: Internal Medicine

## 2024-10-26 DIAGNOSIS — B009 Herpesviral infection, unspecified: Secondary | ICD-10-CM

## 2024-10-26 NOTE — Progress Notes (Signed)
 Internal Medicine Clinic Attending  Case discussed with the resident at the time of the visit.  We reviewed the resident's history and exam and pertinent patient test results.  I agree with the assessment, diagnosis, and plan of care documented in the resident's note.

## 2025-03-30 ENCOUNTER — Ambulatory Visit: Admitting: Internal Medicine
